# Patient Record
Sex: Male | Born: 1960
Health system: Southern US, Community
[De-identification: ages and names within clinical notes are randomized; demographics above are authoritative.]

## PROBLEM LIST (undated history)

## (undated) DIAGNOSIS — Z8673 Personal history of transient ischemic attack (TIA), and cerebral infarction without residual deficits: Secondary | ICD-10-CM

## (undated) DIAGNOSIS — G4730 Sleep apnea, unspecified: Secondary | ICD-10-CM

## (undated) DIAGNOSIS — T7840XA Allergy, unspecified, initial encounter: Secondary | ICD-10-CM

## (undated) DIAGNOSIS — F419 Anxiety disorder, unspecified: Secondary | ICD-10-CM

## (undated) DIAGNOSIS — Z8489 Family history of other specified conditions: Secondary | ICD-10-CM

## (undated) DIAGNOSIS — F32A Depression, unspecified: Secondary | ICD-10-CM

## (undated) DIAGNOSIS — D123 Benign neoplasm of transverse colon: Secondary | ICD-10-CM

## (undated) DIAGNOSIS — D122 Benign neoplasm of ascending colon: Secondary | ICD-10-CM

## (undated) DIAGNOSIS — Z87898 Personal history of other specified conditions: Secondary | ICD-10-CM

## (undated) DIAGNOSIS — H9190 Unspecified hearing loss, unspecified ear: Secondary | ICD-10-CM

## (undated) DIAGNOSIS — I639 Cerebral infarction, unspecified: Secondary | ICD-10-CM

## (undated) DIAGNOSIS — K219 Gastro-esophageal reflux disease without esophagitis: Secondary | ICD-10-CM

## (undated) DIAGNOSIS — Z972 Presence of dental prosthetic device (complete) (partial): Secondary | ICD-10-CM

## (undated) DIAGNOSIS — D124 Benign neoplasm of descending colon: Secondary | ICD-10-CM

## (undated) DIAGNOSIS — F329 Major depressive disorder, single episode, unspecified: Secondary | ICD-10-CM

## (undated) HISTORY — DX: Allergy, unspecified, initial encounter: T78.40XA

## (undated) HISTORY — PX: EYE SURGERY: SHX253

## (undated) HISTORY — DX: Benign neoplasm of transverse colon: D12.3

## (undated) HISTORY — DX: Cerebral infarction, unspecified: I63.9

## (undated) HISTORY — PX: TONSILLECTOMY: SUR1361

## (undated) HISTORY — PX: SALIVARY GLAND SURGERY: SHX768

## (undated) HISTORY — DX: Benign neoplasm of descending colon: D12.4

## (undated) HISTORY — DX: Benign neoplasm of ascending colon: D12.2

---

## 2005-01-16 ENCOUNTER — Emergency Department: Payer: Self-pay | Admitting: Emergency Medicine

## 2006-01-16 ENCOUNTER — Emergency Department: Payer: Self-pay

## 2006-01-16 ENCOUNTER — Other Ambulatory Visit: Payer: Self-pay

## 2006-02-06 ENCOUNTER — Other Ambulatory Visit: Payer: Self-pay

## 2006-02-06 ENCOUNTER — Emergency Department: Payer: Self-pay | Admitting: Emergency Medicine

## 2008-03-24 ENCOUNTER — Emergency Department: Payer: Self-pay | Admitting: Emergency Medicine

## 2008-03-24 ENCOUNTER — Other Ambulatory Visit: Payer: Self-pay

## 2010-06-27 DIAGNOSIS — Z8673 Personal history of transient ischemic attack (TIA), and cerebral infarction without residual deficits: Secondary | ICD-10-CM

## 2010-06-27 HISTORY — DX: Personal history of transient ischemic attack (TIA), and cerebral infarction without residual deficits: Z86.73

## 2010-07-01 ENCOUNTER — Inpatient Hospital Stay: Payer: Self-pay | Admitting: Internal Medicine

## 2011-01-25 ENCOUNTER — Emergency Department: Payer: Self-pay | Admitting: Unknown Physician Specialty

## 2011-03-03 ENCOUNTER — Ambulatory Visit (HOSPITAL_COMMUNITY)
Admission: RE | Admit: 2011-03-03 | Discharge: 2011-03-03 | Disposition: A | Discharge status: Home / Self Care | Payer: No Typology Code available for payment source | Source: Ambulatory Visit | Attending: Orthopaedic Surgery | Admitting: Orthopaedic Surgery

## 2011-03-03 DIAGNOSIS — M6281 Muscle weakness (generalized): Secondary | ICD-10-CM | POA: Insufficient documentation

## 2011-03-03 DIAGNOSIS — IMO0002 Reserved for concepts with insufficient information to code with codable children: Secondary | ICD-10-CM | POA: Insufficient documentation

## 2011-03-03 DIAGNOSIS — M25619 Stiffness of unspecified shoulder, not elsewhere classified: Secondary | ICD-10-CM | POA: Insufficient documentation

## 2011-03-03 DIAGNOSIS — Z5189 Encounter for other specified aftercare: Secondary | ICD-10-CM | POA: Insufficient documentation

## 2011-03-03 DIAGNOSIS — M25519 Pain in unspecified shoulder: Secondary | ICD-10-CM | POA: Insufficient documentation

## 2011-03-03 DIAGNOSIS — R262 Difficulty in walking, not elsewhere classified: Secondary | ICD-10-CM | POA: Insufficient documentation

## 2011-03-03 DIAGNOSIS — M25569 Pain in unspecified knee: Secondary | ICD-10-CM | POA: Insufficient documentation

## 2011-03-03 DIAGNOSIS — M25669 Stiffness of unspecified knee, not elsewhere classified: Secondary | ICD-10-CM | POA: Insufficient documentation
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
## 2011-03-08 ENCOUNTER — Ambulatory Visit (HOSPITAL_COMMUNITY)
Admission: RE | Admit: 2011-03-08 | Discharge: 2011-03-08 | Disposition: A | Discharge status: Home / Self Care | Payer: No Typology Code available for payment source | Source: Ambulatory Visit | Attending: Orthopaedic Surgery | Admitting: Orthopaedic Surgery

## 2011-03-08 DIAGNOSIS — M6281 Muscle weakness (generalized): Secondary | ICD-10-CM

## 2011-03-08 DIAGNOSIS — IMO0002 Reserved for concepts with insufficient information to code with codable children: Secondary | ICD-10-CM

## 2011-03-08 DIAGNOSIS — M25519 Pain in unspecified shoulder: Secondary | ICD-10-CM

## 2011-03-08 NOTE — Progress Notes (Signed)
Occupational Therapy Treatment  Patient Name: Jeffrey Stokes MRN: [id number] Today's Date: 03/08/2011 Manual Therapy [phone]  Therapeutic Exercise [id number] Visit 2/12  Reassessment date: 03/31/11  Diagnosis: Left Shoulder Strain  Referring Physician: Dr. Yisroel Ramming  S:  My hands both go numb. Pain Assessment Currently in Pain?: Yes Pain Score:   5 Pain Location: Shoulder Pain Orientation: Left Pain Type: Acute pain   O:  Exercise/Treatments Shoulder Exercises Shoulder Elevation: Seated;AROM;10 reps Shoulder Flexion: Supine;PROM;AAROM;10 reps Therapy Ball (Shoulder Flexion): 20 Pulleys (Shoulder Flexion): Flex and Abd 2 min each Shoulder Extension: Seated;AROM;10 reps Shoulder Retraction: Seated;AROM;10 reps Shoulder Protraction: Supine;PROM;AAROM;10 reps Shoulder Horizontal ABduction: Supine;PROM;AAROM;10 reps Shoulder ABduction: Supine;PROM;AAROM;10 reps Therapy Ball (Shoulder Abduction): 20 abduction  Shoulder External Rotation: Supine;PROM;AAROM;10 reps Shoulder Internal Rotation: Supine;PROM;AAROM;10 reps Row: Seated;AROM;10 reps Additional ROM/Strengthening Exercises Thumb Tacks: 1 min Elevation/Depression - Shoulder Pro/Retraction: 1 min Manual Therapy Manual Therapy: Myofascial release Myofascial Release: MFR and manual stretching to left upper arm, pectoral, and scapular region to decrease pain and fascial restriction and increase pain-free range of motion in his left shoulder region.   Goals Short Term Goals Short Term Goal 1: Patient will be I with HEP. Short Term Goal 1 Progress: Progressing toward goal Short Term Goal 2: Patient will increase PROM in left shoulder to [facility] for increased ability to sleep in a variety of positions at night. Short Term Goal 2 Progress: Progressing toward goal Short Term Goal 3: Patient will increase left shoulder strength to 4-/5 for increased ability lifting tools at work. Short Term Goal 3 Progress: Progressing toward goal Short  Term Goal 4: Patient will decrease pain to 4/10 in his left shoulder. Short Term Goal 4 Progress: Progressing toward goal Short Term Goal 5: Patient will decrease fascial restrictions to min-mod from mod. Long Term Goals Long Term Goal 1: Patient will return to prior level of I with all B/IADLs, work, and leisure activities. Long Term Goal 1 Progress: Progressing toward goal Long Term Goal 2: Patient will increase AROM in his left shoulder to [facility] for increased ability to reach overhead. Long Term Goal 2 Progress: Progressing toward goal Long Term Goal 3: Patient will increase left shoulder strength to 5/5 for increased ability to perform job tasks. Long Term Goal 3 Progress: Progressing toward goal Long Term Goal 4: Patient will decrease pain level to 1/10 in his left shoulder. Long Term Goal 4 Progress: Progressing toward goal Long Term Goal 5: Patient will decrease fascial restrictions to trace in his left shoulder region. Long Term Goal 5 Progress: Progressing toward goal End of Session Patient Active Problem List  Diagnoses  . Knee pain  . Knee stiffness  . Pain in joint, shoulder region  . Sprain and strain of unspecified site of shoulder and upper arm  . Muscle weakness (generalized)   End of Session Activity Tolerance: Patient tolerated treatment well General Behavior During Session: [facility] for tasks performed Cognition: [facility] for tasks performed OT Assessment and Plan Clinical Impression Statement: A:  patient is very wary of moving shoulder into abduction greater than 90. OT Plan: P:  Increase PROM to [facility].  Attempt ball circles.   Shirlean Mylar, OTR/L  03/08/2011, 9:31 AM

## 2011-03-08 NOTE — Progress Notes (Signed)
Physical Therapy Treatment Patient Details  Name: Jeffrey Stokes MRN: [id number] Date of Birth: 07/22/1960  Today's Date: 03/08/2011 Time: 4098-1191 Time Calculation (min): 42 min Visit#: 2 of 12 Re-eval: 04/01/11 Charges:  therex 30, ultasound 8'  Subjective: Symptoms/Limitations Symptoms: Pt. states walking on oneven ground most painful and has alot of it at his house.  Currently 4/10 medial L knee. Pain Assessment Currently in Pain?: Yes Pain Score:   4 Pain Location: Knee Pain Orientation: Left Pain Type: Acute pain   Exercise/Treatments Warmup: Bike 6' @ 3.0 seat 12 Standing: Heelraises 10X Toeraises 10X Squats 10X SLS L only max of 8 sec, 3 trials Supine:  SLR x10  Heel Slides x10  Active HS stretch 2x30 sec.  Sidelying:  Hip Abduction x10  Hip Adduction x10 Prone:   SLR Hamstring curls  Progress to steps and lat/fwd/bkwd walking with bungee cord   Modalities Modalities: Ultrasound Manual Therapy Manual Therapy: Myofascial release Myofascial Release: MFR and manual stretching to left upper arm, pectoral, and scapular region to decrease pain and fascial restriction and increase pain-free range of motion in his left shoulder region. Ultrasound Ultrasound Location: L medial knee Ultrasound Parameters: 1.4 w/cm2 for 8 minutes Ultrasound Goals: Pain  Physical Therapy Assessment and Plan PT Assessment and Plan Clinical Impression Statement: Added standing exercises, including SLS with max time of 8 seconds.  Pt. able to complete therex with VC's to perform slow and controlled. PT Treatment/Interventions: Therapeutic exercise (ultrasound) PT Plan: progress strength and stablitily.  Add ITB stretch, LAQ and forward lunges next visit.      Problem List Patient Active Problem List  Diagnoses  . Knee pain  . Knee stiffness  . Pain in joint, shoulder region  . Sprain and strain of unspecified site of shoulder and upper arm  . Muscle weakness (generalized)     PT - End of Session Activity Tolerance: Patient tolerated treatment well General Behavior During Session: [facility] for tasks performed Cognition: [facility] for tasks performed  Bascom Levels, Ella Guillotte B 03/08/2011, 10:30 AM

## 2011-03-09 ENCOUNTER — Ambulatory Visit (HOSPITAL_COMMUNITY)
Admission: RE | Admit: 2011-03-09 | Discharge: 2011-03-09 | Payer: No Typology Code available for payment source | Source: Ambulatory Visit | Attending: Occupational Therapy | Admitting: Occupational Therapy

## 2011-03-09 DIAGNOSIS — M25519 Pain in unspecified shoulder: Secondary | ICD-10-CM

## 2011-03-09 DIAGNOSIS — IMO0002 Reserved for concepts with insufficient information to code with codable children: Secondary | ICD-10-CM

## 2011-03-09 DIAGNOSIS — M6281 Muscle weakness (generalized): Secondary | ICD-10-CM

## 2011-03-09 NOTE — Progress Notes (Signed)
Physical Therapy Treatment Patient Details  Name: Jeffrey Stokes MRN: [id number] Date of Birth: [birth year]  Today's Date: 03/09/2011 Time: 1022-1110 Time Calculation (min): 48 min Visit#: 3 of 12 Re-eval: 04/01/11 Charge: therex 34 min [place] 9 min  Subjective: Symptoms/Limitations Symptoms: PT: 3/10 L knee medial epicondyle region, feeling a whole lot better than when I came in Pain Assessment Currently in Pain?: Yes Pain Score:   3 Pain Location: Knee Pain Orientation: Left Pain Type: Acute pain  Precautions/Restrictions     Mobility (including Balance)       Exercise/Treatments Warmup:  Bike 6' @ 4.5 seat 12  Standing:  Heelraises 10X 2 sets Toeraises 10X  Squats 10X  SLS L only max of 13 sec, 3 trials  Forward lunge 10 reps Forward step up 10x 4" Lateral step up 10x 4" Step down 10x 4" Supine:  SLR x10  Heel Slides x10  Active HS stretch 3x30 sec.  ITB st 3x 30" Sidelying:  Hip Abduction x10  Hip Adduction x10  Seated:  LAQ 10x  Prone:  SLR 10x Hamstring curls 10x [place] 3 MhZ @ 1.4 w/cm2 continuous to medial L knee Modalities Modalities: Ultrasound Manual Therapy Myofascial Release: MFR and manual stretching to left upper arm, pectoral, and scapular region to decrease pain and fascial restrictions and increase pain-free range of motion in his left shoulder region. Ultrasound Ultrasound Location: L medial knee Ultrasound Parameters: 3 MHz @ 1.4 w/cm2 continuous for 8 min Ultrasound Goals: Pain  Physical Therapy Assessment and Plan PT Assessment and Plan Clinical Impression Statement: Added forward lunges and 4" step in all directions with vc/demonstration for proper tech.  Pt required vc for posture/form with lunges.  Pt tolerated treatment well with vc's to perform slow and controlled.  Improving SLS. PT Plan: Progress to steps and lat/fwd/bkwd walking with bungee cord    Goals    Problem List Patient Active Problem List  Diagnoses  . Knee pain    . Knee stiffness  . Pain in joint, shoulder region  . Sprain and strain of unspecified site of shoulder and upper arm  . Muscle weakness (generalized)    PT - End of Session Activity Tolerance: Patient tolerated treatment well General Behavior During Session: [facility] for tasks performed Cognition: [facility] for tasks performed  Juel Burrow 03/09/2011, 12:10 PM

## 2011-03-09 NOTE — Progress Notes (Signed)
Occupational Therapy Treatment  Patient Name: Jeffrey Stokes MRN: [id number] Today's Date: 03/09/2011  Time in 940  Time out 1024 Manual Therapy  [phone] Therapeutic Exercise 1000-1024 Visit  3/12  Reassessment date: 03/31/11  Diagnosis: Left Shoulder Strain  Referring Physician: Dr. Yisroel Ramming      HPI: Symptoms/Limitations Symptoms: PT: 3/10 L knee medial epicondyle region, feeling a whole lot better than when I came in Pain Assessment Currently in Pain?: Yes Pain Score:   3 Pain Location: Knee Pain Orientation: Left Pain Type: Acute pain    Exercise/Treatments Shoulder Exercises Shoulder Elevation: Seated;AROM;10 reps Shoulder Flexion: Supine;PROM;AAROM;15 reps Therapy Ball (Shoulder Flexion): 20 Pulleys (Shoulder Flexion): Flex and Abd 2 min each Shoulder Extension: Seated;AROM;10 reps Shoulder Retraction: Seated;AROM;10 reps Shoulder Protraction: Supine;PROM;AAROM;15 reps Shoulder Horizontal ABduction: Supine;PROM;AAROM;15 reps Shoulder ABduction: Supine;PROM;AAROM;15 reps Therapy Ball (Shoulder Abduction): 20 abduction  Shoulder External Rotation: Supine;PROM;AAROM;15 reps Shoulder Internal Rotation: Supine;PROM;AAROM;15 reps Row: Seated;AROM;10 reps Additional ROM/Strengthening Exercises Thumb Tacks: 1 min Elevation/Depression - Shoulder Pro/Retraction: 1 min Manual Therapy Myofascial Release: MFR and manual stretching to left upper arm, pectoral, and scapular region to decrease pain and fascial restrictions and increase pain-free range of motion in his left shoulder region.   Goals Short Term Goals Short Term Goal 1: Patient will be I with HEP. Short Term Goal 1 Progress: Progressing toward goal Short Term Goal 2: Patient will increase PROM in left shoulder to [facility] for increased ability to sleep in a variety of positions at night. Short Term Goal 2 Progress: Progressing toward goal Short Term Goal 3: Patient will increase left shoulder strength to 4-/5 for  increased ability lifting tools at work. Short Term Goal 3 Progress: Progressing toward goal Short Term Goal 4: Patient will decrease pain to 4/10 in his left shoulder. Short Term Goal 4 Progress: Progressing toward goal Short Term Goal 5: Patient will decrease fascial restrictions to min-mod from mod. Short Term Goal 5 Progress: Progressing toward goal Long Term Goals Long Term Goal 1: Patient will return to prior level of I with all B/IADLs, work, and leisure activities. Long Term Goal 1 Progress: Progressing toward goal Long Term Goal 2: Patient will increase AROM in his left shoulder to [facility] for increased ability to reach overhead. Long Term Goal 2 Progress: Progressing toward goal Long Term Goal 3: Patient will increase left shoulder strength to 5/5 for increased ability to perform job tasks. Long Term Goal 3 Progress: Progressing toward goal Long Term Goal 4: Patient will decrease pain level to 1/10 in his left shoulder. Long Term Goal 4 Progress: Progressing toward goal Long Term Goal 5: Patient will decrease fascial restrictions to trace in his left shoulder region. Long Term Goal 5 Progress: Progressing toward goal End of Session Patient Active Problem List  Diagnoses  . Knee pain  . Knee stiffness  . Pain in joint, shoulder region  . Sprain and strain of unspecified site of shoulder and upper arm  . Muscle weakness (generalized)   End of Session Activity Tolerance: Patient tolerated treatment well OT Assessment and Plan Clinical Impression Statement: Full PROM today, some pain with initial abduction movement but subsides with reps  Unable to do ball circles secondary to time. Prognosis: Good OT Plan: attempt ball circles.   Noralee Stain, Cleopha Indelicato L 03/09/2011, 11:08 AM

## 2011-03-11 ENCOUNTER — Ambulatory Visit (HOSPITAL_COMMUNITY)
Admission: RE | Admit: 2011-03-11 | Discharge: 2011-03-11 | Disposition: A | Discharge status: Home / Self Care | Payer: No Typology Code available for payment source | Source: Ambulatory Visit | Attending: Specialist | Admitting: Specialist

## 2011-03-11 DIAGNOSIS — IMO0002 Reserved for concepts with insufficient information to code with codable children: Secondary | ICD-10-CM

## 2011-03-11 DIAGNOSIS — M6281 Muscle weakness (generalized): Secondary | ICD-10-CM

## 2011-03-11 DIAGNOSIS — M25519 Pain in unspecified shoulder: Secondary | ICD-10-CM

## 2011-03-11 NOTE — Progress Notes (Signed)
Physical Therapy Treatment Patient Details  Name: Jeffrey Stokes MRN: [id number] Date of Birth: 08/14/1960  Today's Date: 03/11/2011 Time: 5638-7564 Time Calculation (min): 40 min Charges: 8' [place], 32' TE Visit#: 4 of 12 Re-eval: 04/01/11    Subjective: Symptoms/Limitations Symptoms: PT S: Pt reports he is compliant with his HEP and some exercises hurt sometimes when I have been doing too much.  The ultrasound helped a lot last time. Pain Assessment Currently in Pain?: Yes Pain Score:   2 Pain Location: Knee Pain Orientation: Left  Precautions/Restrictions     Mobility (including Balance)       Exercise/Treatments Stretches   Aerobic Stationary Bike: 6' @ 3.5 seat 12 Machines for Strengthening Cybex Knee Extension: BLE x10 2 PL Cybex Knee Flexion: BLE x10 3 PL Plyometrics   Standing Heel Raises: 10 reps;Limitations;2 sets Heel Raises Limitations: LLE only for first set and eccentric lowering for second set Lateral Step Up: Left;2 sets;10 reps;Step Height: 4" Forward Step Up: Left;2 sets;10 reps;Step Height: 4" Step Down: Left;2 sets;Step Height: 4" Wall Squat: 5 reps;10 seconds Lunge Walking - Round Trips: 1 RT SLS: 3x30 sec w/intermittent HHA Other Standing Knee Exercises: Toe raises 2x 10 Seated   Supine Short Arc Quad Sets: Left;1 set;10 reps;Limitations Short Arc Quad Sets Limitations: 3 lbs Sidelying   Prone      Modalities Modalities: Ultrasound Manual Therapy Ultrasound Ultrasound Location: Completed by PT: L Medial Knee  Ultrasound Parameters: 3 MHz @ 1.4 w/cm2 continuous for 8 min  Ultrasound Goals: Pain  Physical Therapy Assessment and Plan PT Assessment and Plan Clinical Impression Statement: Pt able to tolerate all new ther-ex well without increased pain.  Continues to have difficulty with eccentric quad control.  PT Plan: Cont to progress and add sports cord    Goals    Problem List Patient Active Problem List  Diagnoses  . Knee  pain  . Knee stiffness  . Pain in joint, shoulder region  . Sprain and strain of unspecified site of shoulder and upper arm  . Muscle weakness (generalized)    PT - End of Session Activity Tolerance: Patient tolerated treatment well  Jeffrey Stokes 03/11/2011, 9:48 AM

## 2011-03-11 NOTE — Progress Notes (Signed)
Occupational Therapy Treatment  Patient Details  Name: Jeffrey Stokes MRN: [id number] Date of Birth: 1961-05-18  Today's Date: 03/11/2011 Time: 1191-4782 Time Calculation (min): 40 min Manual Therapy [phone] Therapeutic Exercise 826-848  Visit#: 4 of 12 Re-eval:  03/31/11 Diagnosis: Left Shoulder Strain  Referring Physician: Dr. Yisroel Ramming  Subjective S: I can tell it is helping, but it feels like it is going to go out of joint. Pain Assessment Currently in Pain?: Yes Pain Score:   4 Pain Location: shoulder Pain Orientation: Left   Exercise/Treatments Manual Therapy Manual Therapy: Myofascial release Myofascial Release: MFR and manual stretching to left upper arm, pectoral, and scapular region to decrease pain and fascial restrictions and increase pain free range of motion in his left shoulder region. Therapeutic Exercises:  Please see doc flow sheet, exercises would not pull over.  Goals Short Term Goals Short Term Goal 1: Patient will be I with HEP. Short Term Goal 2: Patient will increase PROM in left shoulder to [facility] for increased ability to sleep in a variety of positions at night. Short Term Goal 3: Patient will increase left shoulder strength to 4-/5 for increased ability lifting tools at work. Short Term Goal 4: Patient will decrease pain to 4/10 in his left shoulder. Short Term Goal 5: Patient will decrease fascial restrictions to min-mod from mod. Long Term Goals Long Term Goal 1: Patient will return to prior level of I with all B/IADLs, work, and leisure activities. Long Term Goal 2: Patient will increase AROM in his left shoulder to [facility] for increased ability to reach overhead. Long Term Goal 3: Patient will increase left shoulder strength to 5/5 for increased ability to perform job tasks. Long Term Goal 4: Patient will decrease pain level to 1/10 in his left shoulder. Long Term Goal 5: Patient will decrease fascial restrictions to trace in his left shoulder  region. End of Session Patient Active Problem List  Diagnoses  . Knee pain  . Knee stiffness  . Pain in joint, shoulder region  . Sprain and strain of unspecified site of shoulder and upper arm  . Muscle weakness (generalized)   End of Session Activity Tolerance: Patient tolerated treatment well General Behavior During Session: [facility] for tasks performed Cognition: [facility] for tasks performed OT Assessment and Plan Clinical Impression Statement: A:  full PROM this date in supine.  Pain level remains elevated despite ease of range of motion. Added rhythmic stabilization in supine.  Hold ball circles. OT Plan: P:  hold ball circles due to shoulder integrity.  attempt AROM in seated.   Shirlean Mylar, OTR/L  03/11/2011, 10:45 AM

## 2011-03-14 ENCOUNTER — Ambulatory Visit (HOSPITAL_COMMUNITY)
Admission: RE | Admit: 2011-03-14 | Discharge: 2011-03-14 | Payer: No Typology Code available for payment source | Source: Ambulatory Visit | Attending: Orthopaedic Surgery | Admitting: Orthopaedic Surgery

## 2011-03-14 DIAGNOSIS — M25519 Pain in unspecified shoulder: Secondary | ICD-10-CM

## 2011-03-14 DIAGNOSIS — IMO0002 Reserved for concepts with insufficient information to code with codable children: Secondary | ICD-10-CM

## 2011-03-14 DIAGNOSIS — M6281 Muscle weakness (generalized): Secondary | ICD-10-CM

## 2011-03-14 NOTE — Progress Notes (Signed)
Occupational Therapy Treatment  Patient Details  Name: Jeffrey Stokes MRN: [id number] Date of Birth: 07/23/1960  Today's Date: 03/14/2011 Time: 4098-1191 Time Calculation (min): 35 min Manual Therapy [phone]  Therapeutic Exercise [phone] Visit#: 5 of 12  Re-eval: 03/31/11  Diagnosis: Left Shoulder Strain  Referring Physician: Dr. Yisroel Ramming     Subjective Symptoms/Limitations Symptoms: Feel good.  Shoulder is mild, I feel a lot better. Pain Assessment Currently in Pain?: Yes Pain Score:   2 Pain Location: Shoulder Pain Orientation: Left     Exercise/Treatments  03/14/11 1006  Shoulder Exercises: Supine  Protraction PROM;10 reps;AAROM;15 reps  Horizontal ABduction PROM;10 reps;AAROM;15 reps  External Rotation PROM;10 reps;AAROM;15 reps  Internal Rotation PROM;10 reps;AAROM;15 reps  Flexion PROM;10 reps;AAROM;15 reps  ABduction PROM;10 reps;AAROM;15 reps  Other Supine Exercises AROM in supine all of above exercises except abd x 10 reps  Shoulder Exercises: Seated  Extension 15 reps;AROM  Retraction 15 reps;AAROM  Row 15 reps;AROM  Protraction AROM;10 reps  Horizontal ABduction AROM;10 reps  External Rotation AROM;10 reps (with elbow at side 0 degrees abduction)  Internal Rotation AROM;10 reps (at 0 degrees abduction)  Flexion AROM;10 reps  Shoulder Exercises: Therapy Ball  Flexion 20 reps  ABduction 20 reps  Shoulder Exercises: ROM/Strengthening  Rhythmic Stabilization, Supine 30" at 90 degrees    Additional ROM/Strengthening Exercises Thumb Tacks: 1 min Elevation/Depression - Shoulder Pro/Retraction: 1 min Manual Therapy Manual Therapy: Myofascial release Myofascial Release: MFR and manual stretching to left upper arm, pectoral, and scapular region to decrease pain and fascial restrictions and increase pain-free range of motion in his left shoulder region.  Goals Short Term Goals Short Term Goal 1: Patient will be I with HEP. Short Term Goal 1 Progress:  Progressing toward goal Short Term Goal 2: Patient will increase PROM in left shoulder to [facility] for increased ability to sleep in a variety of positions at night. Short Term Goal 2 Progress: Progressing toward goal Short Term Goal 3: Patient will increase left shoulder strength to 4-/5 for increased ability lifting tools at work. Short Term Goal 3 Progress: Progressing toward goal Short Term Goal 4: Patient will decrease pain to 4/10 in his left shoulder. Short Term Goal 4 Progress: Progressing toward goal Short Term Goal 5: Patient will decrease fascial restrictions to min-mod from mod. Short Term Goal 5 Progress: Progressing toward goal Long Term Goals Long Term Goal 1: Patient will return to prior level of I with all B/IADLs, work, and leisure activities. Long Term Goal 1 Progress: Progressing toward goal Long Term Goal 2: Patient will increase AROM in his left shoulder to [facility] for increased ability to reach overhead. Long Term Goal 2 Progress: Progressing toward goal Long Term Goal 3: Patient will increase left shoulder strength to 5/5 for increased ability to perform job tasks. Long Term Goal 3 Progress: Progressing toward goal Long Term Goal 4: Patient will decrease pain level to 1/10 in his left shoulder. Long Term Goal 4 Progress: Progressing toward goal Long Term Goal 5: Patient will decrease fascial restrictions to trace in his left shoulder region. Long Term Goal 5 Progress: Progressing toward goal End of Session Patient Active Problem List  Diagnoses  . Knee pain  . Knee stiffness  . Pain in joint, shoulder region  . Sprain and strain of unspecified site of shoulder and upper arm  . Muscle weakness (generalized)   End of Session Activity Tolerance: Patient tolerated treatment well General Behavior During Session: [facility] for tasks performed Cognition: [facility] for tasks  performed OT Assessment and Plan Clinical Impression Statement: added seated AROM which patient tolerated well.   Patient with consistent pain with abduction in supine and seated stating he feels like it is going to "pop out"  Some decrease in pain with eccentric contraction. Prognosis: Good OT Plan: increase reps with seated and supine.   Kerisha Goughnour L. Chanise Habeck, COTA/L  03/14/2011, 10:25 AM

## 2011-03-14 NOTE — Progress Notes (Signed)
Physical Therapy Treatment Patient Details  Name: Jeffrey Stokes MRN: [id number] Date of Birth: 18-Nov-1960  Today's Date: 03/14/2011 Time: 9147-8295 Time Calculation (min): 53 min Visit#: 5 of 12 Re-eval: 04/01/11  Charges: 8' [place], 36' TE   Subjective: Symptoms/Limitations Symptoms: Pt. states he still has a "catch" medial knee, reports 2/10 pain L medial knee. Pain Assessment Currently in Pain?: Yes Pain Score:   2 Pain Location: Knee Pain Orientation: Left   Exercise/Treatments  Stationary Bike: 6'@8 .0 seat 12 Machines for Strengthening Cybex Knee Extension: 2PL 2X10 Cybex Knee Flexion: 3PL 2X10 Standing: Heel Raises: 2 sets;10 reps Heel Raises Limitations: LLE only for first set and eccentric lowering for second set Lateral Step Up: Left;2 sets;10 reps;Step Height: 4" Forward Step Up: Left;2 sets;10 reps;Step Height: 4" Step Down: Left;2 sets;Step Height: 4" Wall Squat: 10 reps;10 seconds Lunge Walking - Round Trips: 2 RT SLS: 1 minute Other Standing Knee Exercises: Toe raises 2x 10 Supine Short Arc Quad Sets: Left;10 reps;Limitations;2 sets Short Arc Quad Sets Limitations: 3#     Modalities Modalities: Ultrasound Ultrasound Ultrasound Location: L medial knee Ultrasound Parameters: 1.4w/cm2 continuous for 8 minutes at 3 MHz Ultrasound Goals: Pain  Physical Therapy Assessment and Plan PT Assessment and Plan Clinical Impression Statement: Pt. having more pain in R knee today; able to complete all therex with VC's for performing slowly to focus on eccentrics.  Able to perform SLS for 1 minute today. PT Treatment/Interventions: Therapeutic exercise (Ultrasound) PT Plan: Progress strength and stablility.  Add sports cord and begin rebounder on L LE next visit.    Goals PT Short Term Goals Time to Complete Goals: 4 weeks PT Short Term Goal 1: 1.Pt will be Independent in HEP in order to maximize therapeutic effect. PT Short Term Goal 1 - Progress: Progressing  toward goal PT Short Term Goal 2: 2.Pt will report pain less than 4/10 during 50%  of his day PT Short Term Goal 2 - Progress: Progressing toward goal PT Short Term Goal 3: 3.Pt will demonstrate LLE SLS x10 sec.  PT Short Term Goal 3 - Progress: Met PT Short Term Goal 4: 4.Pt will improve AROM to 0-110 PT Short Term Goal 4 - Progress: Met PT Short Term Goal 5: 5. Pt will go from sit to stand w/o UE support in 12 sec for improved LE power. PT Short Term Goal 5 - Progress: Progressing toward goal PT Long Term Goals PT Long Term Goal 1: 1.Pt will improve knee AROM to [facility] in order to ambulate with appropriate gait mechanics without knee brace. PT Long Term Goal 1 - Progress: Progressing toward goal PT Long Term Goal 2: 2.Pt will increase LE strength to [facility] in order to ambulate and stand for greater than an hour to participate in community activities PT Long Term Goal 2 - Progress: Progressing toward goal Long Term Goal 3: 3.Pt will report pain less than or equal to 3/10 for 75% of his day for improved quality of life. Long Term Goal 3 Progress: Progressing toward goal Long Term Goal 4: 4.Pt will improve her LEFS score by 9 points for improved perceived functional ability.  Long Term Goal 4 Progress: Progressing toward goal  Problem List Patient Active Problem List  Diagnoses  . Knee pain  . Knee stiffness  . Pain in joint, shoulder region  . Sprain and strain of unspecified site of shoulder and upper arm  . Muscle weakness (generalized)    PT - End of Session Activity Tolerance: Patient  tolerated treatment well General Behavior During Session: [facility] for tasks performed Cognition: [facility] for tasks performed  Emeline Gins B 03/14/2011, 9:42 AM

## 2011-03-16 ENCOUNTER — Ambulatory Visit (HOSPITAL_COMMUNITY)
Admission: RE | Admit: 2011-03-16 | Discharge: 2011-03-16 | Payer: No Typology Code available for payment source | Source: Ambulatory Visit | Attending: Orthopaedic Surgery | Admitting: Orthopaedic Surgery

## 2011-03-16 DIAGNOSIS — IMO0002 Reserved for concepts with insufficient information to code with codable children: Secondary | ICD-10-CM

## 2011-03-16 DIAGNOSIS — M6281 Muscle weakness (generalized): Secondary | ICD-10-CM

## 2011-03-16 DIAGNOSIS — M25519 Pain in unspecified shoulder: Secondary | ICD-10-CM

## 2011-03-16 NOTE — Progress Notes (Signed)
Physical Therapy Treatment Patient Details  Name: Jeffrey Stokes MRN: [id number] Date of Birth: 09/21/1960  Today's Date: 03/16/2011 Time: 8295-6213 Time Calculation (min): 51 min Visit#: 6  of 12   Re-eval: 04/01/11 Charges:  therex 38', ultrasound 8'    Subjective: Symptoms/Limitations Symptoms: Pt. reports currently not bad, but pain typically increases with therex.  2/10 pain L knee. Pain Assessment Currently in Pain?: Yes Pain Score:   2 Pain Location: Knee Pain Orientation: Left Pain Type: Acute pain   Exercise/Treatments  Aerobic Stationary Bike: 6'@8 .0 seat 12 Machines for Strengthening Cybex Knee Extension: 2.5 PL 2X10 Cybex Knee Flexion: 3.5 PL 2X10 Standing Heel Raises: 15 reps Heel Raises Limitations: LLE only Lateral Step Up: Left;15 reps;Step Height: 4" Forward Step Up: Left;15 reps;Step Height: 4" Step Down: Left;15 reps;Step Height: 4" Wall Squat: 10 reps;10 seconds Lunge Walking - Round Trips: 2 RT Rebounder: red, L LE only 15X Other Standing Knee Exercises: bungee walk R/L, F/B 5X each Supine Straight Leg Raises: 2 sets;10 reps   Modalities Modalities: Ultrasound Manual Therapy Manual Therapy: Myofascial release Myofascial Release: MFR and manual stretching to left upper arm, pectoral, and scapular region to decrease pain and fascial restrictions and increase pain-free range of motion in his left shoulder region. Ultrasound Ultrasound Location: L medial knee Ultrasound Parameters: 1.4 w/cm2 continuous for 8 minutes at 3 MHz  Physical Therapy Assessment and Plan PT Assessment and Plan Clinical Impression Statement: Pt. able to complete new therex,progressed to rebounder with 2 touchdowns X 15 reps; Added bungee cord walk due to assist with pt's goal of amb up/down hills at home. PT Plan: Progress per POC. Increase reps as able.     Problem List Patient Active Problem List  Diagnoses  . Knee pain  . Knee stiffness  . Pain in joint,  shoulder region  . Sprain and strain of unspecified site of shoulder and upper arm  . Muscle weakness (generalized)    PT - End of Session Activity Tolerance: Patient tolerated treatment well General Behavior During Session: [facility] for tasks performed Cognition: [facility] for tasks performed  Jeffrey Stokes B 03/16/2011, 10:24 AM

## 2011-03-16 NOTE — Progress Notes (Signed)
Occupational Therapy Treatment  Patient Details  Name: Jeffrey Stokes MRN: [id number] Date of Birth: [birth year]  Today's Date: 03/16/2011 Time: 2952-8413 Time Calculation (min): 42 min Visit#: 6  of 12   Re-eval: 04/13/11  Manual Therapy [id number]  Therapeutic Exercise [id number]  Diagnosis: Left Shoulder Strain  Referring Physician: Dr. Yisroel Ramming   Subjective  Symptoms/Limitations  Symptoms: Patient reports being less cautious with his movements Pain Assessment  Currently in Pain?: Yes  Pain Score: 3 Pain Location: Shoulder  Pain Orientation: Left    Exercise/Treatments Supine Protraction: PROM;10 reps Horizontal ABduction: PROM;10 reps External Rotation: PROM;10 reps Internal Rotation: PROM;10 reps Flexion: PROM;10 reps ABduction: PROM;10 reps Seated Protraction: AROM;12 reps Horizontal ABduction: AROM;12 reps External Rotation: AROM;12 reps Internal Rotation: AROM;12 reps Flexion: AROM;12 reps Abduction: AROM;12 reps Sidelying External Rotation: AROM;10 reps Internal Rotation: AROM;10 reps Flexion: AROM;10 reps ABduction: AROM;10 reps Other Sidelying Exercises: hzn abd AROM x10 ROM / Strengthening / Isometric Strengthening Proximal Shoulder Strengthening, Supine: x 20 each Rhythmic Stabilization, Supine: 1' at 90 degrees    Manual Therapy Manual Therapy: Myofascial release Myofascial Release: MFR and manual stretching to left upper arm, pectoral, and scapular region to decrease pain and fascial restrictions and increase pain-free range of motion in his left shoulder region.  Occupational Therapy Assessment and Plan OT Assessment and Plan Clinical Impression Statement: added proximal shoulder strengthening in supine which fatigued patient but did not increase pain.  Unable to complete AROM supine secondary to pain and feeling of instability.  Swiitched to sidlying which patient did not have any pain with.  Added UBE which patient tolerated well. Rehab Potential:  Good OT Plan: increase reps and attempt supine AROM, see progress note.   Goals Short Term Goals Short Term Goal 1: Patient will be I with HEP. Short Term Goal 1 Progress: Progressing toward goal Short Term Goal 2: Patient will increase PROM in left shoulder to [facility] for increased ability to sleep in a variety of positions at night. Short Term Goal 2 Progress: Progressing toward goal Short Term Goal 3: Patient will increase left shoulder strength to 4-/5 for increased ability lifting tools at work. Short Term Goal 3 Progress: Progressing toward goal Short Term Goal 4: Patient will decrease pain to 4/10 in his left shoulder. Short Term Goal 4 Progress: Progressing toward goal Short Term Goal 5: Patient will decrease fascial restrictions to min-mod from mod. Short Term Goal 5 Progress: Progressing toward goal Long Term Goals Long Term Goal 1: Patient will return to prior level of I with all B/IADLs, work, and leisure activities. Long Term Goal 1 Progress: Progressing toward goal Long Term Goal 2: Patient will increase AROM in his left shoulder to [facility] for increased ability to reach overhead. Long Term Goal 2 Progress: Progressing toward goal Long Term Goal 3: Patient will increase left shoulder strength to 5/5 for increased ability to perform job tasks. Long Term Goal 3 Progress: Progressing toward goal Long Term Goal 4: Patient will decrease pain level to 1/10 in his left shoulder. Long Term Goal 4 Progress: Progressing toward goal Long Term Goal 5: Patient will decrease fascial restrictions to trace in his left shoulder region. Long Term Goal 5 Progress: Progressing toward goal End of Session Patient Active Problem List  Diagnoses  . Knee pain  . Knee stiffness  . Pain in joint, shoulder region  . Sprain and strain of unspecified site of shoulder and upper arm  . Muscle weakness (generalized)   End of Session Activity Tolerance:  Patient tolerated treatment well General Behavior  During Session: [facility] for tasks performed Cognition: [facility] for tasks performed   Noralee Stain, Lexa Coronado L 03/16/2011, 1:45 PM

## 2011-03-18 ENCOUNTER — Ambulatory Visit (HOSPITAL_COMMUNITY)
Admission: RE | Admit: 2011-03-18 | Discharge: 2011-03-18 | Disposition: A | Discharge status: Home / Self Care | Payer: No Typology Code available for payment source | Source: Ambulatory Visit | Attending: Orthopaedic Surgery | Admitting: Orthopaedic Surgery

## 2011-03-18 DIAGNOSIS — M25519 Pain in unspecified shoulder: Secondary | ICD-10-CM

## 2011-03-18 DIAGNOSIS — M6281 Muscle weakness (generalized): Secondary | ICD-10-CM

## 2011-03-18 DIAGNOSIS — IMO0002 Reserved for concepts with insufficient information to code with codable children: Secondary | ICD-10-CM

## 2011-03-18 NOTE — Progress Notes (Signed)
Physical Therapy Treatment Patient Details  Name: Jeffrey Stokes MRN: [id number] Date of Birth: 03-29-1961  Today's Date: 03/18/2011 Time: 9147-8295 Time Calculation (min): 43 min Charges: 35' TE, 8' [place] Visit#: 7  of 12   Re-eval: 04/01/11    Subjective: Symptoms/Limitations Symptoms: Been driving tp [place] and back today, got a cortizone shot in shoulder, L knee pain 1 and a half/10. Pain Assessment Currently in Pain?: Yes Pain Score:   1 Pain Location: Knee Pain Orientation: Left  Exercise/Treatments  Modalities Modalities: Ultrasound Ultrasound Ultrasound Location: L medial knee  Ultrasound Parameters: 1.4 w/cm2 continuous for 8 minutes at 3 MHz  Ultrasound Goals: Pain  03/18/11 0700  Knee Exercises: Machines for Strengthening  Cybex Knee Extension 3 PL 2x 10  Cybex Knee Flexion 5 PL 1x 10  Knee Exercises: Standing  Lateral Step Up Left;1 set;Step Height: 6"  Forward Step Up Left;10 reps;Step Height: 6"  Step Down Left;10 reps;Step Height: 6"  Wall Squat 10 reps;10 seconds  Lunge Walking - Round Trips 3 RT  Stairs 3 RT ascend/ descend with intermittent handrail  SLS with Vectors 5 sec hold each direction x5  Other Standing Knee Exercises bungee walk R/L, F/B 7X each      Physical Therapy Assessment and Plan PT Assessment and Plan Clinical Impression Statement: Pt continues to tolerate new higher level exercises well without an increase in pain.   PT Plan: Continue to progress functional strength and ROM.     Goals    Problem List Patient Active Problem List  Diagnoses  . Knee pain  . Knee stiffness  . Pain in joint, shoulder region  . Sprain and strain of unspecified site of shoulder and upper arm  . Muscle weakness (generalized)       Jeffrey Stokes 03/18/2011, 2:32 PM

## 2011-03-18 NOTE — Progress Notes (Signed)
Occupational Therapy Treatment  Patient Details  Name: Jeffrey Stokes MRN: [id number] Date of Birth: 03-05-1961  Today's Date: 03/18/2011 Time: 6962-9528 Time Calculation (min): 45 min Visit#: 7  of 12   Re-eval: 04/13/11   Manual Therapy [phone] (21')  Therapeutic Exercise [phone] (23') Diagnosis: Left Shoulder Strain  Referring Physician: Dr. Yisroel Ramming  Subjective Symptoms/Limitations Symptoms: That cortisone shot made my pain go up to a 4/10 Pain Assessment Currently in Pain?: Yes Pain Score:   4 Pain Location: Shoulder Pain Orientation: Left Pain Type: Acute pain   Exercise/Treatments Supine Horizontal ABduction: PROM;10 reps External Rotation: PROM;10 reps Internal Rotation: PROM;10 reps Flexion: PROM;10 reps ABduction: PROM;10 reps Seated Extension: Theraband (standing) Theraband Level (Shoulder Extension): Level 2 (Red) Retraction: Theraband (standing) Theraband Level (Shoulder Retraction): Level 2 (Red) Row: Theraband (standing) Theraband Level (Shoulder Row): Level 2 (Red) Protraction: AROM;15 reps Horizontal ABduction: AROM;15 reps External Rotation: AROM;15 reps;Theraband;10 reps (standing) Theraband Level (Shoulder External Rotation): Level 2 (Red) Internal Rotation: AROM;15 reps;Theraband;10 reps (standing) Theraband Level (Shoulder Internal Rotation): Level 2 (Red) Flexion: AROM;15 reps Abduction: AROM;15 reps Sidelying External Rotation: AROM;15 reps Internal Rotation: AROM;15 reps Flexion: AROM;15 reps ABduction: AROM;15 reps Other Sidelying Exercises: hzn abd AROM x15 Other Sidelying Exercises: protraction AROM x 15 Therapy Ball Flexion: 20 reps ABduction: 20 reps ROM / Strengthening / Isometric Strengthening UBE (Upper Arm Bike): 3' forward and 3' reverse 1.0 Thumb Tacks: 1 min Proximal Shoulder Strengthening, Supine: x 20 each Rhythmic Stabilization, Supine: 1' at 90 degrees   Modalities Modalities: Ultrasound Manual Therapy Manual  Therapy: Myofascial release Myofascial Release: MFR and manual stretching to left upper arm, pectoral, and scapular region to decrease pain and fascial restrictions and increase pain-free range of motion in his left shoulder region.  Occupational Therapy Assessment and Plan OT Assessment and Plan Clinical Impression Statement: added wall wash which fatigued patient but no increase in pain also added tband scapula stab ex.  patient tolerated well, no increase paini. Rehab Potential: Good OT Plan: add one pound to sidlying exercises   Goals Short Term Goals Short Term Goal 1: Patient will be I with HEP. Short Term Goal 2: Patient will increase PROM in left shoulder to [facility] for increased ability to sleep in a variety of positions at night. Short Term Goal 3: Patient will increase left shoulder strength to 4-/5 for increased ability lifting tools at work. Short Term Goal 4: Patient will decrease pain to 4/10 in his left shoulder. Short Term Goal 5: Patient will decrease fascial restrictions to min-mod from mod. Long Term Goals Long Term Goal 1: Patient will return to prior level of I with all B/IADLs, work, and leisure activities. Long Term Goal 2: Patient will increase AROM in his left shoulder to [facility] for increased ability to reach overhead. Long Term Goal 3: Patient will increase left shoulder strength to 5/5 for increased ability to perform job tasks. Long Term Goal 4: Patient will decrease pain level to 1/10 in his left shoulder. Long Term Goal 5: Patient will decrease fascial restrictions to trace in his left shoulder region. End of Session Patient Active Problem List  Diagnoses  . Knee pain  . Knee stiffness  . Pain in joint, shoulder region  . Sprain and strain of unspecified site of shoulder and upper arm  . Muscle weakness (generalized)   End of Session Activity Tolerance: Patient tolerated treatment well   Virginio Isidore L. Johann Gascoigne, COTA/L  03/18/2011, 3:57 PM

## 2011-03-21 ENCOUNTER — Ambulatory Visit (HOSPITAL_COMMUNITY)
Admission: RE | Admit: 2011-03-21 | Discharge: 2011-03-21 | Payer: No Typology Code available for payment source | Source: Ambulatory Visit | Attending: Physical Therapy | Admitting: Physical Therapy

## 2011-03-21 DIAGNOSIS — IMO0002 Reserved for concepts with insufficient information to code with codable children: Secondary | ICD-10-CM

## 2011-03-21 DIAGNOSIS — M6281 Muscle weakness (generalized): Secondary | ICD-10-CM

## 2011-03-21 DIAGNOSIS — M25519 Pain in unspecified shoulder: Secondary | ICD-10-CM

## 2011-03-21 NOTE — Progress Notes (Signed)
Physical Therapy Treatment Patient Details  Name: Jeffrey Stokes MRN: [id number] Date of Birth: 06/13/1961  Today's Date: 03/21/2011 Time: 4098-1191 Time Calculation (min): 35 min Visit#: 8  of 12   Re-eval: 03/31/11 Charges:  therex 24, ultrasound 8'    Subjective: Symptoms/Limitations Symptoms: Pt. states he's feeling great today.  0.2 pain in L shoulder since cortisone shot, 0.5 pain in L knee today. Pain Assessment Currently in Pain?: Yes Pain Score:   1 (0.5) Pain Location: Knee Pain Orientation: Left  Machines for Strengthening  Cybex Knee Extension: 3 PL 2X10  Cybex Knee Flexion: 5 PL 2X10  Standing  Heel Raises Limitations: LLE only 15X  Lateral Step Up: 15 reps;Step Height: 6"  Forward Step Up: Left;15 reps;Step Height: 6"  Step Down: Left;15 reps;Step Height: 6"  Wall Squat: 10 reps;10 seconds  Lunge Walking - Round Trips: 3 RT  Rebounder: red, L LE only 15X (time today) Other Standing Knee Exercises: bungee walk R/L, F/B 5X each (time today) [place] 3RT (time today) Supine  Ultrasound  Modalities Modalities: Ultrasound Ultrasound Ultrasound Location: L medial knee Ultrasound Parameters: 1.4 w/cm2 continuous for 8 minutes with 3 MHz Ultrasound Goals: Pain  Physical Therapy Assessment and Plan PT Assessment and Plan Clinical Impression Statement: Pt. with increasing strength and decreasing pain. PT Plan: Continue to progress per POC; Pt. returns to MD 04/01/11.    Problem List Patient Active Problem List  Diagnoses  . Knee pain  . Knee stiffness  . Pain in joint, shoulder region  . Sprain and strain of unspecified site of shoulder and upper arm  . Muscle weakness (generalized)    PT - End of Session Activity Tolerance: Patient tolerated treatment well General Behavior During Session: [facility] for tasks performed Cognition: [facility] for tasks performed  Bascom Levels, Dionne Knoop B 03/21/2011, 10:20 AM

## 2011-03-21 NOTE — Progress Notes (Signed)
Occupational Therapy Treatment  Patient Details  Name: Jeffrey Stokes MRN: [id number] Date of Birth: Aug 06, 1960  Today's Date: 03/21/2011 Time: 4696-2952 Time Calculation (min): 39 min Visit#: 8  of 12   Re-eval: 04/13/11 Manual Therapy 1036-1050 Therapeutic Exercise  8413-2440 Diagnosis: Left Shoulder Strain  Referring Physician: Dr. Yisroel Ramming    Subjective Symptoms/Limitations Symptoms: 0.2 in my shoulder, that cortisone shot really helped.  i even slept on it last night. Pain Assessment Currently in Pain?: Yes Pain Score:  (0.2) Pain Location: Knee Pain Orientation: Left Exercise/Treatments Supine Protraction: Strengthening;10 reps;Weights Protraction Weight (lbs): 1# Horizontal ABduction: Strengthening;10 reps;Weights Horizontal ABduction Weight (lbs): 1# External Rotation: Strengthening;10 reps;Weights External Rotation Weight (lbs): 1# Internal Rotation: Strengthening;10 reps;Weights Internal Rotation Weight (lbs): 1# Flexion: Strengthening;10 reps;Weights Shoulder Flexion Weight (lbs): 1# ABduction: Strengthening;10 reps;Weights Shoulder ABduction Weight (lbs): 1# Seated Extension: Theraband Theraband Level (Shoulder Extension): Level 3 (Green) Retraction: Theraband Theraband Level (Shoulder Retraction): Level 3 (Green) Row: Theraband Theraband Level (Shoulder Row): Level 3 (Green) Protraction: Strengthening;10 reps;Weights Protraction Weight (lbs): 1# Horizontal ABduction: Strengthening;10 reps;Weights Horizontal ABduction Weight (lbs): 1# External Rotation: Strengthening;10 reps;Weights External Rotation Weight (lbs): 1# Internal Rotation: Strengthening;10 reps;Weights Internal Rotation Weight (lbs): 1# Flexion: Strengthening;10 reps;Weights Flexion Weight (lbs): 1# Abduction: Strengthening;10 reps;Weights ABduction Weight (lbs): 1#  Sidelying External Rotation:  (switched to supine)   Flexion:  (d/c) ABduction:  (d/c) ROM / Strengthening /  Isometric Strengthening UBE (Upper Arm Bike): 3' forward and 3' reverse 1.5 Wall Wash: 2' Thumb Tacks: 1 min     Occupational Therapy Assessment and Plan OT Assessment and Plan Clinical Impression Statement: Patient stated he was able to hang shelves overhead at home over the weekend, added AROM supine with 1# and 1# to seated, no increase pain with any task and little to no fatigue. Rehab Potential: Good OT Plan: increse reps   Goals Short Term Goals Short Term Goal 1: Patient will be I with HEP. Short Term Goal 2: Patient will increase PROM in left shoulder to [facility] for increased ability to sleep in a variety of positions at night. Short Term Goal 3: Patient will increase left shoulder strength to 4-/5 for increased ability lifting tools at work. Short Term Goal 4: Patient will decrease pain to 4/10 in his left shoulder. Short Term Goal 5: Patient will decrease fascial restrictions to min-mod from mod. Long Term Goals Long Term Goal 1: Patient will return to prior level of I with all B/IADLs, work, and leisure activities. Long Term Goal 2: Patient will increase AROM in his left shoulder to [facility] for increased ability to reach overhead. Long Term Goal 3: Patient will increase left shoulder strength to 5/5 for increased ability to perform job tasks. Long Term Goal 4: Patient will decrease pain level to 1/10 in his left shoulder. Long Term Goal 5: Patient will decrease fascial restrictions to trace in his left shoulder region. End of Session Patient Active Problem List  Diagnoses  . Knee pain  . Knee stiffness  . Pain in joint, shoulder region  . Sprain and strain of unspecified site of shoulder and upper arm  . Muscle weakness (generalized)   End of Session Activity Tolerance: Patient tolerated treatment well   Noel Rodier L. Noralee Stain, COTA/L  03/21/2011, 2:34 PM

## 2011-03-23 ENCOUNTER — Ambulatory Visit (HOSPITAL_COMMUNITY)
Admission: RE | Admit: 2011-03-23 | Discharge: 2011-03-23 | Payer: No Typology Code available for payment source | Source: Ambulatory Visit | Attending: Orthopaedic Surgery | Admitting: Orthopaedic Surgery

## 2011-03-23 DIAGNOSIS — IMO0002 Reserved for concepts with insufficient information to code with codable children: Secondary | ICD-10-CM

## 2011-03-23 DIAGNOSIS — M6281 Muscle weakness (generalized): Secondary | ICD-10-CM

## 2011-03-23 DIAGNOSIS — M25519 Pain in unspecified shoulder: Secondary | ICD-10-CM

## 2011-03-23 NOTE — Progress Notes (Signed)
Occupational Therapy Treatment  Patient Details  Name: Jeffrey Stokes MRN: [id number] Date of Birth: 1961/01/02  Today's Date: 03/23/2011 Time: 4098-1191 Time Calculation (min): 43 min Visit#: 9  of 12   Re-eval: 04/13/11 (Going to MD on 04/01/11) Therapeutic Exercise [phone] 42'  Diagnosis: Left Shoulder Strain  Referring Physician: Dr. Yisroel Ramming    Subjective Symptoms/Limitations Symptoms: I feel like I could take on the world with this arm. Pain Assessment Currently in Pain?: No/denies Pain Score: 0-No pain  Exercise/Treatments Supine Protraction: Strengthening;15 reps Protraction Weight (lbs): 1# Horizontal ABduction: Strengthening;15 reps Horizontal ABduction Weight (lbs): 1# External Rotation: Strengthening;15 reps External Rotation Weight (lbs): 1# Internal Rotation: Strengthening;15 reps Internal Rotation Weight (lbs): 1# Flexion: Strengthening;15 reps Shoulder Flexion Weight (lbs): 1# ABduction: Strengthening;15 reps Shoulder ABduction Weight (lbs): 1# Seated Extension: Theraband;15 reps Theraband Level (Shoulder Extension): Level 3 (Green) Retraction: Theraband;15 reps Theraband Level (Shoulder Retraction): Level 3 (Green) Row: Theraband;15 reps Theraband Level (Shoulder Row): Level 3 (Green) Protraction: Strengthening;15 reps Protraction Weight (lbs): 1# Horizontal ABduction: Strengthening;15 reps Horizontal ABduction Weight (lbs): 1# External Rotation: Strengthening;15 reps;Theraband Theraband Level (Shoulder External Rotation): Level 3 (Green) External Rotation Weight (lbs): 1# Internal Rotation: Strengthening;15 reps;Theraband Theraband Level (Shoulder Internal Rotation): Level 3 (Green) Internal Rotation Weight (lbs): 1# Flexion: Strengthening;15 reps Flexion Weight (lbs): 1# Abduction: Strengthening;15 reps ABduction Weight (lbs): 1# Sidelying External Rotation: Strengthening;15 reps;Weights External Rotation Weight (lbs): 1# Internal Rotation:  Strengthening;15 reps;Weights Internal Rotation Weight (lbs): 1# Flexion: Strengthening;15 reps;Weights Flexion Weight (lbs): 1# ABduction: Strengthening;15 reps;Weights ABduction Weight (lbs): 1# Other Sidelying Exercises: hzn abd strenghtening with 1# ROM / Strengthening / Isometric Strengthening UBE (Upper Arm Bike): 3' forward and 3' reverse 2.0 Cybex Press: 1 plate;10 reps Cybex Row: 1 plate;10 reps Wall Wash: 3' Thumb Tacks: 2'   Occupational Therapy Assessment and Plan OT Assessment and Plan Clinical Impression Statement: added 1# in sidlying and cybex press and row.  Added tband to hep for scap stab exercises. OT Plan: Increase reps with cybex and attempt to increase weight if patient still has no pain. D/C tband to HEP   Goals Short Term Goals Short Term Goal 1: Patient will be I with HEP. Short Term Goal 2: Patient will increase PROM in left shoulder to [facility] for increased ability to sleep in a variety of positions at night. Short Term Goal 3: Patient will increase left shoulder strength to 4-/5 for increased ability lifting tools at work. Short Term Goal 4: Patient will decrease pain to 4/10 in his left shoulder. Short Term Goal 5: Patient will decrease fascial restrictions to min-mod from mod. Long Term Goals Long Term Goal 1: Patient will return to prior level of I with all B/IADLs, work, and leisure activities. Long Term Goal 2: Patient will increase AROM in his left shoulder to [facility] for increased ability to reach overhead. Long Term Goal 3: Patient will increase left shoulder strength to 5/5 for increased ability to perform job tasks. Long Term Goal 4: Patient will decrease pain level to 1/10 in his left shoulder. Long Term Goal 5: Patient will decrease fascial restrictions to trace in his left shoulder region. End of Session Patient Active Problem List  Diagnoses  . Knee pain  . Knee stiffness  . Pain in joint, shoulder region  . Sprain and strain of unspecified  site of shoulder and upper arm  . Muscle weakness (generalized)   End of Session Activity Tolerance: Patient tolerated treatment well General Behavior During Session: [facility] for tasks performed  Cognition: [facility] for tasks performed   Kadijah Shamoon L. Kaisen Ackers, COTA/L  03/23/2011, 11:44 AM

## 2011-03-23 NOTE — Progress Notes (Signed)
Physical Therapy Treatment Patient Details  Name: Jeffrey Stokes MRN: [id number] Date of Birth: 24-Jul-1960  Today's Date: 03/23/2011 Time: 8295-6213 Time Calculation (min): 27 min Visit#: 9  of 12   Re-eval: 03/31/11  Charge: therex 21 min  Subjective: Symptoms/Limitations Symptoms: PT 1/2 out of 10 L knee pain today, feeling a lot better. Pain Assessment Currently in Pain?: Yes Pain Score:   1 Pain Location: Knee Pain Orientation: Left   Exercise/Treatments Aerobic Stationary Bike: 6'@8 .0 seat 12 Machines for Strengthening Cybex Knee Extension: 3.5 PL 2x 15 Cybex Knee Flexion: 5.5 PL 2x 15  Standing Heel Raises: 20 reps;Limitations Heel Raises Limitations: LLE only Lateral Step Up: Limitations Lateral Step Up Limitations: 3RT on stairwell Forward Step Up: Limitations Forward Step Up Limitations: 3RT on stairwell Step Down: Limitations Step Down Limitations: 3 RT on stairwell Functional Squat: 2 sets;10 reps Wall Squat: 15 reps;10 seconds Lunge Walking - Round Trips: 3 RT Stairs: 3 RT ascend/ descend with intermittent handrail Other Standing Knee Exercises: bungee walk R/L, F/B 10X each  Physical Therapy Assessment and Plan PT Assessment and Plan Clinical Impression Statement: Pt tolerated well towards treatment.  Strength improving with no increased pain during total session.  Able to increase weight with cybex quad and hamstring machine without diff.  Held [place] today, secondary to low pain level and no increase pain.   PT Plan: Assess pain without the [place], continue to progress per POC.  Pt returns to MD 04/01/2011..    Goals    Problem List Patient Active Problem List  Diagnoses  . Knee pain  . Knee stiffness  . Pain in joint, shoulder region  . Sprain and strain of unspecified site of shoulder and upper arm  . Muscle weakness (generalized)    PT - End of Session Activity Tolerance: Patient tolerated treatment well General Behavior During Session: [facility] for  tasks performed Cognition: [facility] for tasks performed  Juel Burrow 03/23/2011, 10:59 AM

## 2011-03-25 ENCOUNTER — Ambulatory Visit (HOSPITAL_COMMUNITY): Payer: No Typology Code available for payment source | Admitting: Physical Therapy

## 2011-03-25 ENCOUNTER — Telehealth (HOSPITAL_COMMUNITY): Payer: Self-pay | Admitting: Physical Therapy

## 2011-03-29 ENCOUNTER — Ambulatory Visit (HOSPITAL_COMMUNITY): Payer: No Typology Code available for payment source | Admitting: Physical Therapy

## 2011-03-29 ENCOUNTER — Telehealth (HOSPITAL_COMMUNITY): Payer: Self-pay

## 2011-03-30 ENCOUNTER — Ambulatory Visit (HOSPITAL_COMMUNITY)
Admission: RE | Admit: 2011-03-30 | Discharge: 2011-03-30 | Disposition: A | Discharge status: Home / Self Care | Payer: No Typology Code available for payment source | Source: Ambulatory Visit | Attending: Orthopaedic Surgery | Admitting: Orthopaedic Surgery

## 2011-03-30 DIAGNOSIS — M25519 Pain in unspecified shoulder: Secondary | ICD-10-CM | POA: Insufficient documentation

## 2011-03-30 DIAGNOSIS — R262 Difficulty in walking, not elsewhere classified: Secondary | ICD-10-CM | POA: Insufficient documentation

## 2011-03-30 DIAGNOSIS — M25619 Stiffness of unspecified shoulder, not elsewhere classified: Secondary | ICD-10-CM | POA: Insufficient documentation

## 2011-03-30 DIAGNOSIS — M25569 Pain in unspecified knee: Secondary | ICD-10-CM | POA: Insufficient documentation

## 2011-03-30 DIAGNOSIS — IMO0002 Reserved for concepts with insufficient information to code with codable children: Secondary | ICD-10-CM

## 2011-03-30 DIAGNOSIS — M25669 Stiffness of unspecified knee, not elsewhere classified: Secondary | ICD-10-CM | POA: Insufficient documentation

## 2011-03-30 DIAGNOSIS — Z5189 Encounter for other specified aftercare: Secondary | ICD-10-CM | POA: Insufficient documentation

## 2011-03-30 DIAGNOSIS — M6281 Muscle weakness (generalized): Secondary | ICD-10-CM | POA: Insufficient documentation
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
## 2011-03-30 NOTE — Progress Notes (Signed)
Occupational Therapy Treatment  Patient Details  Name: Jeffrey Stokes MRN: [id number] Date of Birth: Jan 29, 1961  Today's Date: 03/30/2011 Time: 7829-5621 Time Calculation (min): 34 min Visit#: 10  of 12   Re-eval: 04/13/11 [facility] Therapy  106-117  11' Therapeutic Exercise  117-140  23' Diagnosis: Left Shoulder Strain  Referring Physician: Dr. Yisroel Ramming   Subjective Symptoms/Limitations Symptoms: I feel good even though I fell the other day. Pain Assessment Currently in Pain?: No/denies Pain Score: 0-No pain Exercise/Treatments Supine Protraction: Strengthening;12 reps Protraction Weight (lbs): 2# Horizontal ABduction: Strengthening;12 reps Horizontal ABduction Weight (lbs): 2# External Rotation: Strengthening;12 reps External Rotation Weight (lbs): 2# Internal Rotation: Strengthening;12 reps Internal Rotation Weight (lbs): 2# Flexion: Strengthening;12 reps Shoulder Flexion Weight (lbs): 2# ABduction: Strengthening;12 reps Shoulder ABduction Weight (lbs): 2# Seated Extension:  (d/c to hep) Retraction:  (d/c to hep) Row:  (d/c to hep) Protraction: Strengthening;12 reps Protraction Weight (lbs): 2# Horizontal ABduction: Strengthening;12 reps Horizontal ABduction Weight (lbs): 2# External Rotation: Strengthening;12 reps Theraband Level (Shoulder External Rotation):  (d/c to hep) External Rotation Weight (lbs): 2# Internal Rotation: Strengthening;12 reps Theraband Level (Shoulder Internal Rotation):  (d/c to ehp) Internal Rotation Weight (lbs): 2# Flexion: Strengthening;12 reps Flexion Weight (lbs): 2# Abduction: Strengthening;12 reps ABduction Weight (lbs): 2# Sidelying External Rotation:  (switched to supine) ROM / Strengthening / Isometric Strengthening UBE (Upper Arm Bike): 3' forward and 3' reverse 3.0 Cybex Press: 2 plate;15 reps Cybex Row: 2 plate;15 reps Wall Wash: 3' Thumb Tacks: 2' Proximal Shoulder Strengthening, Seated: x20 with 2# Rhythmic  Stabilization, Seated: 1' at 90   Manual Therapy Manual Therapy: Myofascial release Myofascial Release: MFR and manual stretching to left upper arm, pectoral, and scapular region to decrease pain and fascial restrictions and increase pain-free range of motion in his left shoulder region.  Occupational Therapy Assessment and Plan OT Assessment and Plan Clinical Impression Statement: A:  Added 2# to supine and seated ex and prox. shoulder strength.  None of these produced pain or discomfort.  Pt going to MD on friday. Rehab Potential: Good OT Plan: Possible d/c per MD recommendation.   Goals Short Term Goals Short Term Goal 1: Patient will be I with HEP. Short Term Goal 2: Patient will increase PROM in left shoulder to [facility] for increased ability to sleep in a variety of positions at night. Short Term Goal 3: Patient will increase left shoulder strength to 4-/5 for increased ability lifting tools at work. Short Term Goal 4: Patient will decrease pain to 4/10 in his left shoulder. Short Term Goal 5: Patient will decrease fascial restrictions to min-mod from mod. Long Term Goals Long Term Goal 1: Patient will return to prior level of I with all B/IADLs, work, and leisure activities. Long Term Goal 2: Patient will increase AROM in his left shoulder to [facility] for increased ability to reach overhead. Long Term Goal 3: Patient will increase left shoulder strength to 5/5 for increased ability to perform job tasks. Long Term Goal 4: Patient will decrease pain level to 1/10 in his left shoulder. Long Term Goal 5: Patient will decrease fascial restrictions to trace in his left shoulder region. End of Session Patient Active Problem List  Diagnoses  . Knee pain  . Knee stiffness  . Pain in joint, shoulder region  . Sprain and strain of unspecified site of shoulder and upper arm  . Muscle weakness (generalized)   End of Session Activity Tolerance: Patient tolerated treatment well   Kinzee Happel L.  Noralee Stain, COTA/L  03/30/2011, 1:39 PM

## 2011-04-01 ENCOUNTER — Ambulatory Visit (HOSPITAL_COMMUNITY): Payer: No Typology Code available for payment source | Admitting: Physical Therapy

## 2013-10-30 ENCOUNTER — Ambulatory Visit: Payer: Self-pay | Admitting: Internal Medicine

## 2014-03-23 ENCOUNTER — Emergency Department: Payer: Self-pay | Admitting: Emergency Medicine

## 2014-03-24 LAB — CBC
HCT: 42.4 % (ref 40.0–52.0)
HGB: 14.5 g/dL (ref 13.0–18.0)
MCH: 33.4 pg (ref 26.0–34.0)
MCHC: 34.1 g/dL (ref 32.0–36.0)
MCV: 98 fL (ref 80–100)
Platelet: 202 10*3/uL (ref 150–440)
RBC: 4.33 10*6/uL — AB (ref 4.40–5.90)
RDW: 13.4 % (ref 11.5–14.5)
WBC: 16.1 10*3/uL — ABNORMAL HIGH (ref 3.8–10.6)

## 2014-03-24 LAB — BASIC METABOLIC PANEL
Anion Gap: 7 (ref 7–16)
BUN: 13 mg/dL (ref 7–18)
CO2: 26 mmol/L (ref 21–32)
Calcium, Total: 7.9 mg/dL — ABNORMAL LOW (ref 8.5–10.1)
Chloride: 110 mmol/L — ABNORMAL HIGH (ref 98–107)
Creatinine: 1.03 mg/dL (ref 0.60–1.30)
EGFR (African American): >60
EGFR (Non-African Amer.): >60
Glucose: 105 mg/dL — ABNORMAL HIGH (ref 65–99)
OSMOLALITY: 285 (ref 275–301)
Potassium: 4 mmol/L (ref 3.5–5.1)
Sodium: 143 mmol/L (ref 136–145)

## 2014-03-24 LAB — TROPONIN I: Troponin-I: <0.02 ng/mL

## 2014-03-27 HISTORY — PX: SHOULDER ARTHROSCOPY: SHX128

## 2014-04-07 ENCOUNTER — Other Ambulatory Visit (HOSPITAL_COMMUNITY): Payer: Self-pay | Admitting: Cardiology

## 2014-04-07 DIAGNOSIS — R0989 Other specified symptoms and signs involving the circulatory and respiratory systems: Secondary | ICD-10-CM

## 2014-04-08 ENCOUNTER — Ambulatory Visit (HOSPITAL_COMMUNITY): Payer: Worker's Compensation | Attending: Orthopedic Surgery | Admitting: *Deleted

## 2014-04-08 DIAGNOSIS — I6523 Occlusion and stenosis of bilateral carotid arteries: Secondary | ICD-10-CM | POA: Diagnosis not present

## 2014-04-08 DIAGNOSIS — I639 Cerebral infarction, unspecified: Secondary | ICD-10-CM | POA: Diagnosis not present

## 2014-04-08 DIAGNOSIS — R0989 Other specified symptoms and signs involving the circulatory and respiratory systems: Secondary | ICD-10-CM

## 2014-04-08 NOTE — Progress Notes (Signed)
Carotid Duplex Performed 

## 2014-04-15 ENCOUNTER — Encounter: Payer: Self-pay | Admitting: Cardiovascular Disease

## 2014-04-15 ENCOUNTER — Ambulatory Visit (INDEPENDENT_AMBULATORY_CARE_PROVIDER_SITE_OTHER): Payer: Worker's Compensation | Admitting: Cardiovascular Disease

## 2014-04-15 VITALS — BP 113/79 | HR 50 | Ht 72.0 in | Wt 205.5 lb

## 2014-04-15 DIAGNOSIS — Z7189 Other specified counseling: Secondary | ICD-10-CM

## 2014-04-15 DIAGNOSIS — I779 Disorder of arteries and arterioles, unspecified: Secondary | ICD-10-CM | POA: Diagnosis not present

## 2014-04-15 DIAGNOSIS — I639 Cerebral infarction, unspecified: Secondary | ICD-10-CM

## 2014-04-15 DIAGNOSIS — Z01818 Encounter for other preprocedural examination: Secondary | ICD-10-CM

## 2014-04-15 DIAGNOSIS — I6523 Occlusion and stenosis of bilateral carotid arteries: Secondary | ICD-10-CM | POA: Insufficient documentation

## 2014-04-15 DIAGNOSIS — I739 Peripheral vascular disease, unspecified: Secondary | ICD-10-CM

## 2014-04-15 MED ORDER — TRAMADOL HCL 50 MG PO TABS: 50.0000 mg | ORAL_TABLET | Freq: Three times a day (TID) | ORAL | Status: DC | PRN

## 2014-04-15 MED ORDER — CLOPIDOGREL BISULFATE 75 MG PO TABS: 75.0000 mg | ORAL_TABLET | Freq: Every day | ORAL | Status: DC

## 2014-04-15 NOTE — Patient Instructions (Addendum)
Your physician has requested that you have an echocardiogram. Echocardiography is a painless test that uses sound waves to create images of your heart. It provides your doctor with information about the size and shape of your heart and how well your heart's chambers and valves are working. This procedure takes approximately one hour. There are no restrictions for this procedure. Office will contact with results via phone or letter.    Begin Plavix 75mg  daily - after shoulder surgery - new sent to [facility] when begin the Plavix  Follow up pending test results above

## 2014-04-15 NOTE — Progress Notes (Signed)
Patient ID: Jeffrey Stokes, male   DOB: 03-Aug-1960, 53 y.o.   MRN: [id number]       CARDIOLOGY CONSULT NOTE  Patient ID: Jeffrey Stokes MRN: [id number] DOB/AGE: [birth year] 53 y.o.  Admit date: (Not on file) Primary Physician PROVIDER NOT [place]  Reason for Consultation: preop clearance  HPI: The patient is a 53 year old male with a prior history of tobacco use and three prior strokes who has been referred for preoperative risk stratification prior to undergoing left shoulder scope and rotator cuff repair. An ECG performed in the office today demonstrates sinus bradycardia, heart rate 50 beats per minute, with no ischemic ST segment or T-wave abnormalities. Recent carotid artery Dopplers demonstrated mild bilateral carotid artery disease with 1-39% stenosis. He takes a baby aspirin approximately three times per week. He denies symptoms of chest pain, shortness of breath, palpitations, lightheadedness, orthopnea, PND, leg swelling and syncope. He suffered his first stroke in 2007 and quit smoking at that time. He said he recently had his cholesterol checked and results were "normal". I do not have a copy of these results. He said his father and mother are healthy and his father is 66 and his mother is 61. 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 His strokes have affected the right side of his body and he feels weaker on this side. He has significant left shoulder and arm discomfort due to a torn rotator cuff.  Soc: Works at [employer]. Quit smoking in 2007. Fam: No h/o premature CAD.    Allergies  Allergen Reactions  . Hydrocodone Anxiety    Anxiety attacks  . Norco [Hydrocodone-Acetaminophen] Nausea Only    Current Outpatient Prescriptions  Medication Sig Dispense Refill  . UNKNOWN TO PATIENT Takes medication for pain as needed prescribed by Dr. Noemi Chapel. Pt does not know the name of it.       No current  facility-administered medications for this visit.    No past medical history on file.  No past surgical history on file.  History   Social History  . Marital Status: Single    Spouse Name: N/A    Number of Children: N/A  . Years of Education: N/A   Occupational History  . Not on file.   Social History Main Topics  . Smoking status: Former Smoker -- 2.00 packs/day for 33 years    Types: Cigarettes    Start date: 06/28/1971    Quit date: 12/25/2005  . Smokeless tobacco: Former Systems developer    Types: Chew  . Alcohol Use: No     Comment: Quit a long time ago  . Drug Use: No  . Sexual Activity: Not on file   Other Topics Concern  . Not on file   Social History Narrative  . No narrative on file     No family history of premature CAD in 1st degree relatives.  Prior to Admission medications   Medication Sig Start Date End Date Taking? Authorizing Provider  UNKNOWN TO PATIENT Takes medication for pain as needed prescribed by Dr. Noemi Chapel. Pt does not know the name of it.   Yes Historical Provider, MD     Review of systems complete and found to be negative unless listed above in HPI     Physical exam Blood pressure 113/79, pulse 50, height 6' (1.829 m), weight 205 lb 8 oz (93.214 kg), SpO2 99.00%. General: NAD Neck: No JVD, no thyromegaly  or thyroid nodule.  Lungs: Clear to auscultation bilaterally with normal respiratory effort. CV: Nondisplaced PMI. Bradycardic, regular rhythm, normal S1/S2, no S3/S4, no murmur.  No peripheral edema.  No carotid bruit.  Abdomen: Soft, nontender, no hepatosplenomegaly, no distention.  Skin: Intact without lesions or rashes.  Neurologic: Alert and oriented x 3. 4/5 strength in right upper extremity. Psych: Normal affect. Extremities: No clubbing or cyanosis.  HEENT: Normal.   ECG: Most recent ECG reviewed.  Labs:   No results found for this basename: WBC, HGB, HCT, MCV, PLT   No results found for this basename: NA, K, CL, CO2, BUN,  CREATININE, CALCIUM, LABALBU, PROT, BILITOT, ALKPHOS, ALT, AST, GLUCOSE,  in the last 168 hours No results found for this basename: CKTOTAL, CKMB, CKMBINDEX, TROPONINI    No results found for this basename: CHOL   No results found for this basename: HDL   No results found for this basename: LDLCALC   No results found for this basename: TRIG   No results found for this basename: CHOLHDL   No results found for this basename: LDLDIRECT         Studies: No results found.  ASSESSMENT AND PLAN:  1. Preoperative risk stratification: He does not demonstrate any signs or symptoms of coronary artery disease. Given his history of three prior strokes, I will obtain an echocardiogram to assess left ventricular systolic function and regional wall motion. I do not feel a stress test is indicated at this time. His blood pressure is normal and he is not on antihypertensives. He is bradycardic but there are no ischemic ST segment or T-wave abnormalities on his ECG. 2. Mild bilateral carotid artery disease: Would recommend repeating Dopplers in two years. 3. Recurrent CVA: I have informed him to stop taking aspirin three times per week and after his surgery, to begin taking Plavix 75 mg daily for secondary stroke prevention.  Dispo: to be determined after echocardiogram.   Signed: Kate Sable, M.D., F.A.C.C.  04/15/2014, 10:25 AM

## 2014-04-17 ENCOUNTER — Telehealth: Payer: Self-pay | Admitting: *Deleted

## 2014-04-17 ENCOUNTER — Other Ambulatory Visit (INDEPENDENT_AMBULATORY_CARE_PROVIDER_SITE_OTHER): Payer: Worker's Compensation

## 2014-04-17 ENCOUNTER — Other Ambulatory Visit: Payer: Self-pay

## 2014-04-17 DIAGNOSIS — Z01818 Encounter for other preprocedural examination: Secondary | ICD-10-CM | POA: Diagnosis not present

## 2014-04-17 DIAGNOSIS — I499 Cardiac arrhythmia, unspecified: Secondary | ICD-10-CM | POA: Diagnosis not present

## 2014-04-17 DIAGNOSIS — I639 Cerebral infarction, unspecified: Secondary | ICD-10-CM

## 2014-04-17 NOTE — Telephone Encounter (Signed)
Left message to return call.  Patient has echo scheduled this afternoon at 4:30.  Will try to speak with patient at that time.

## 2014-04-17 NOTE — Telephone Encounter (Signed)
Returned call

## 2014-04-17 NOTE — Telephone Encounter (Signed)
Received labs on patient - recently done on 03/20/14 at [facility]. (scanned into EPIC)  Total Cholesterol - 167 HDL - 32 Triglycerides - 304 LDL - 74   Per Dr. Court Joy review - would first recommend a trial of 3 months of dietary & exercise, then repeat TG's.  If still elevated, would consider meds.

## 2014-04-21 ENCOUNTER — Telehealth: Payer: Self-pay | Admitting: *Deleted

## 2014-04-21 NOTE — Telephone Encounter (Signed)
Patient & fiance notified at Echo visit.

## 2014-04-21 NOTE — Telephone Encounter (Signed)
Message copied by Laurine Blazer on Mon Apr 21, 2014  3:18 PM ------      Message from: Kate Sable A      Created: Fri Apr 18, 2014  5:33 PM       Normal pumping function. ------

## 2014-04-21 NOTE — Telephone Encounter (Signed)
Notes Recorded by Laurine Blazer, LPN on 46/09/7996 at 3:17 PM Jeffrey Stokes (fiance) notified. Informed that pre-op clearance form will be faxed back to [facility] for pending shoulder surgery. ------

## 2014-06-24 ENCOUNTER — Telehealth: Payer: Self-pay | Admitting: *Deleted

## 2014-06-24 NOTE — Telephone Encounter (Signed)
Will defer Rx to PCP, as he is only to f/u with me prn.

## 2014-06-24 NOTE — Telephone Encounter (Signed)
Received fax from [facility] - asking for care consideration for atherosclerotic cardiovascular disease - consider adding a statin.    Forwarded to provider for advice.  See 03/20/14 labs section.  We did not have these results at the time of his last OV.

## 2014-06-26 NOTE — Telephone Encounter (Signed)
Will fax form back to [facility].

## 2014-07-17 ENCOUNTER — Ambulatory Visit (INDEPENDENT_AMBULATORY_CARE_PROVIDER_SITE_OTHER): Payer: Managed Care, Other (non HMO) | Admitting: Podiatry

## 2014-07-17 ENCOUNTER — Encounter: Payer: Self-pay | Admitting: Podiatry

## 2014-07-17 VITALS — BP 112/72 | HR 60 | Resp 16 | Ht 72.0 in | Wt 220.0 lb

## 2014-07-17 DIAGNOSIS — M79675 Pain in left toe(s): Secondary | ICD-10-CM

## 2014-07-17 DIAGNOSIS — L600 Ingrowing nail: Secondary | ICD-10-CM

## 2014-07-17 NOTE — Patient Instructions (Signed)

## 2014-07-17 NOTE — Progress Notes (Signed)
   Subjective:    Patient ID: Jeffrey Stokes, male    DOB: [birth year], 54 y.o.   MRN: [id number]  HPI Comments: 54 year old male presents the office today with complaints of a painful ingrown toenail on the left big toe. He states that he has an ingrown toenail for proximal 5 years this area has been progressive. He states that he periodically alternatives the ingrown or some the nail himself however more recently has been unable to do so. He denies any recent redness or drainage from the site. No other complaints at this time.      Review of Systems  All other systems reviewed and are negative.      Objective:   Physical Exam AAO x3, NAD DP/PT pulses palpable bilaterally, CRT less than 3 seconds Protective sensation intact with Simms Weinstein monofilament, vibratory sensation intact, Achilles tendon reflex intact There is evidence of ingrowing along the lateral portion of the hallux toenail with tenderness to palpation overlying this area. There is a small amount of overlying edema however no erythema or increase in warmth. There is no drainage or purulence expressed. No tenderness along the remaining nails. No open lesions or pre-ulcerative lesions are identified. No other areas of tenderness to bilateral lower extremities. No overlying edema, erythema, increase in warmth bilaterally. MMT 5/5, ROM WNL No pain with calf compression, swelling, warmth, erythema.         Assessment & Plan:  54 year old male lateral left hallux symptomatic ingrown toenail -Treatment options both conservative and surgical were discussed with the patient including alternatives, risks, complications. -At this time discussed partial nail avulsion with chemical matricectomy. Risks and complications were discussed the patient for which she understands and verbally consents the procedure. He states that he has not been taking the Plavix the last 4 days. Under sterile conditions a total of 2.5 mL of a one-to-one  mixture of 2% lidocaine plain and 0.5% Marcaine plain was infiltrated into the left hallux after the site was properly identified. Once anesthetized, the skin was then prepped in sterile fashion. A tourniquet was then applied. Next the lateral portion the left hallux nail was then sharply excised making sure to remove the entire offending nail border. Once the entire nail border was ensured to be removed, phenol was applied under standard conditions and then copiously irrigated. Silvadene was applied followed by a dry sterile dressing. After application of the dressing the tourniquet was removed and there is found to be an immediate capillary refill time to the digit. The patient tolerated the procedure well any complications. There is no signs of infection noted during the procedure. Post procedure instructions were discussed with the patient for which she verbally understood. Monitor for any signs or symptoms of infection and directed to call the office immediately should any occur or go to the ER. -Follow-up in one week for nail check or sooner should any problems arise. In the meantime, encouraged to call the office with any questions, concerns, change in symptoms.

## 2014-07-20 ENCOUNTER — Encounter: Payer: Self-pay | Admitting: Podiatry

## 2014-07-24 ENCOUNTER — Ambulatory Visit (INDEPENDENT_AMBULATORY_CARE_PROVIDER_SITE_OTHER): Payer: Managed Care, Other (non HMO) | Admitting: Podiatry

## 2014-07-24 ENCOUNTER — Encounter: Payer: Self-pay | Admitting: Podiatry

## 2014-07-24 VITALS — HR 63 | Resp 18

## 2014-07-24 DIAGNOSIS — L600 Ingrowing nail: Secondary | ICD-10-CM

## 2014-07-24 NOTE — Patient Instructions (Signed)

## 2014-07-29 ENCOUNTER — Encounter: Payer: Self-pay | Admitting: Podiatry

## 2014-07-29 NOTE — Progress Notes (Signed)
Patient ID: Jeffrey Stokes, male   DOB: 11-30-1960, 54 y.o.   MRN: [id number]  Subjective:  54 year old male returns the office today for evaluation of left hallux partial nail avulsion. He states that since last appointment he is doing well. He has had no significant tenderness to the area and he has had no drainage. He denies any redness or streaking. He has been continuing with Epson salt soaks in about appointment dressings.Denies any systemic complaints such as fevers, chills, nausea, vomiting. No acute changes since last appointment, and no other complaints at this time.   Objective: AAO x3, NAD DP/PT pulses palpable bilaterally, CRT less than 3 seconds Protective sensation intact with Simms Weinstein monofilament, vibratory sensation intact, Achilles tendon reflex intact Left hallux status post partial nail avulsion which is healing well this time. There is no tenderness to palpation overlying the area and there is no drainage/purulence identified. No surrounding erythema or ascending cellulitis. No areas of pinpoint bony tenderness or pain with vibratory sensation bilaterally. MMT 5/5, ROM WNL. No edema, erythema, increase in warmth to bilateral lower extremities.  No other open lesions or pre-ulcerative lesions.  No pain with calf compression, swelling, warmth, erythema  Assessment: 54 year old male 1 week status post left partial nail avulsion with chemical matrixectomy, doing well  Plan: -All treatment options discussed with the patient including all alternatives, risks, complications.  -Recommended patient to continue twice a day Epson salt soaks followed by an appointment a Band-Aid daily until the area is completely healed. Can leave the area uncovered at night. Continue to monitor for any signs or symptoms of infection and directed to call the office immediately should any occur. Follow-up in 2 weeks of the area has not completely healed or sooner if there are any problems. -Patient  encouraged to call the office with any questions, concerns, change in symptoms.

## 2014-11-13 ENCOUNTER — Other Ambulatory Visit: Payer: Self-pay | Admitting: *Deleted

## 2014-11-13 MED ORDER — CLOPIDOGREL BISULFATE 75 MG PO TABS: 75.0000 mg | ORAL_TABLET | Freq: Every day | ORAL | Status: DC

## 2014-12-03 ENCOUNTER — Other Ambulatory Visit: Payer: Self-pay | Admitting: Internal Medicine

## 2014-12-03 ENCOUNTER — Ambulatory Visit (INDEPENDENT_AMBULATORY_CARE_PROVIDER_SITE_OTHER): Payer: Managed Care, Other (non HMO) | Admitting: Internal Medicine

## 2014-12-03 ENCOUNTER — Encounter: Payer: Self-pay | Admitting: Internal Medicine

## 2014-12-03 VITALS — BP 104/60 | HR 68 | Temp 98.1°F | Ht 72.0 in | Wt 216.6 lb

## 2014-12-03 DIAGNOSIS — J019 Acute sinusitis, unspecified: Secondary | ICD-10-CM

## 2014-12-03 DIAGNOSIS — B009 Herpesviral infection, unspecified: Secondary | ICD-10-CM | POA: Insufficient documentation

## 2014-12-03 DIAGNOSIS — I69359 Hemiplegia and hemiparesis following cerebral infarction affecting unspecified side: Secondary | ICD-10-CM | POA: Insufficient documentation

## 2014-12-03 DIAGNOSIS — M67919 Unspecified disorder of synovium and tendon, unspecified shoulder: Secondary | ICD-10-CM | POA: Insufficient documentation

## 2014-12-03 DIAGNOSIS — I639 Cerebral infarction, unspecified: Secondary | ICD-10-CM | POA: Insufficient documentation

## 2014-12-03 DIAGNOSIS — Z8673 Personal history of transient ischemic attack (TIA), and cerebral infarction without residual deficits: Secondary | ICD-10-CM | POA: Insufficient documentation

## 2014-12-03 DIAGNOSIS — G4733 Obstructive sleep apnea (adult) (pediatric): Secondary | ICD-10-CM | POA: Insufficient documentation

## 2014-12-03 MED ORDER — AMOXICILLIN-POT CLAVULANATE 875-125 MG PO TABS: 1.0000 | ORAL_TABLET | Freq: Two times a day (BID) | ORAL | Status: DC

## 2014-12-03 NOTE — Patient Instructions (Addendum)
Resume using Flonase; use benzonate perles for cough especially at night Take all the antibiotics - with food.

## 2014-12-03 NOTE — Progress Notes (Signed)
Date:  12/03/2014   Name:  Jeffrey Stokes   DOB:  June 23, 1961   MRN:  [id number]   Chief Complaint: Sinusitis and Cough   History of Present Illness:  This is a 54 y.o. male who is presenting with sinusitis. Sinusitis This is a new problem. The current episode started in the past 7 days. The problem has been gradually worsening since onset. There has been no fever. Associated symptoms include chills, coughing (thick yellow phlegm), diaphoresis, ear pain, a hoarse voice, sneezing, a sore throat and swollen glands. Pertinent negatives include no headaches. Past treatments include acetaminophen. The treatment provided no relief.  Cough Associated symptoms include chills, ear pain and a sore throat. Pertinent negatives include no chest pain or headaches.    Review of Systems:  Review of Systems  Constitutional: Positive for chills and diaphoresis.  HENT: Positive for ear pain, hoarse voice, sneezing and sore throat. Negative for tinnitus.   Eyes: Negative for discharge.  Respiratory: Positive for cough (thick yellow phlegm).   Cardiovascular: Negative for chest pain and palpitations.  Neurological: Negative for dizziness and headaches.    Patient Active Problem List   Diagnosis Date Noted  . Bilateral carotid artery disease 04/15/2014  . Knee pain 03/03/2011  . Knee stiffness 03/03/2011  . Pain in joint, shoulder region 03/03/2011  . Sprain and strain of unspecified site of shoulder and upper arm 03/03/2011  . Muscle weakness (generalized) 03/03/2011    Prior to Admission medications   Medication Sig Start Date End Date Taking? Authorizing Provider  Ascorbic Acid (VITAMIN C PO) Take by mouth daily.   Yes Historical Provider, MD  celecoxib (CELEBREX) 200 MG capsule Take 1 capsule by mouth daily as needed. 10/30/14  Yes Historical Provider, MD  Cholecalciferol (VITAMIN D PO) Take by mouth daily.   Yes Historical Provider, MD  clopidogrel (PLAVIX) 75 MG tablet Take 1 tablet (75 mg total)  by mouth daily. 11/13/14  Yes Herminio Commons, MD  Cyanocobalamin (B-12 PO) Take by mouth daily.   Yes Historical Provider, MD  Magnesium 250 MG TABS Take by mouth daily.   Yes Historical Provider, MD  methocarbamol (ROBAXIN) 500 MG tablet  04/26/14  Yes Historical Provider, MD  Multiple Vitamins-Minerals (MULTIVITAMIN ADULT PO) Take by mouth daily.   Yes Historical Provider, MD  Omega-3 Fatty Acids (OMEGA 3 PO) Take by mouth daily.   Yes Historical Provider, MD  POTASSIUM PO Take by mouth daily.   Yes Historical Provider, MD  valACYclovir (VALTREX) 1000 MG tablet Take 1 tablet by mouth daily. 11/17/14  Yes Historical Provider, MD  predniSONE (DELTASONE) 5 MG tablet Take 1 tablet by mouth daily. 11/20/14   Historical Provider, MD  traMADol Veatrice Bourbon) 50 MG tablet  04/25/14   Historical Provider, MD    Allergies  Allergen Reactions  . Hydrocodone Anxiety    Anxiety attacks  . Norco [Hydrocodone-Acetaminophen] Nausea Only    Past Surgical History  Procedure Laterality Date  . Shoulder arthroscopy Left 03/2014  . Tonsillectomy      History  Substance Use Topics  . Smoking status: Former Smoker -- 2.00 packs/day for 33 years    Types: Cigarettes    Start date: 06/28/1971    Quit date: 12/25/2005  . Smokeless tobacco: Former Systems developer    Types: Chew  . Alcohol Use: No     Comment: Quit a long time ago     Medication list has been reviewed and updated.  Physical Examination:  Physical Exam  Constitutional: He appears well-developed and well-nourished.  HENT:  Right Ear: Tympanic membrane and external ear normal.  Left Ear: Tympanic membrane and external ear normal.  Nose: Right sinus exhibits maxillary sinus tenderness and frontal sinus tenderness. Left sinus exhibits maxillary sinus tenderness and frontal sinus tenderness.  Mouth/Throat: Uvula is midline. Posterior oropharyngeal edema present. No oropharyngeal exudate.  Eyes: Conjunctivae are normal.  Neck: Normal range of  motion. Neck supple. No thyromegaly present.  Cardiovascular: Normal rate, regular rhythm and normal heart sounds.   Pulmonary/Chest: Effort normal and breath sounds normal. He has no wheezes.  Lymphadenopathy:    He has no cervical adenopathy.  Nursing note and vitals reviewed.   BP 104/60 mmHg  Pulse 68  Temp(Src) 98.1 F (36.7 C)  Ht 6' (1.829 m)  Wt 216 lb 9.6 oz (98.249 kg)  BMI 29.37 kg/m2  SpO2 98%  Assessment and Plan:  1. Acute sinusitis, recurrence not specified, unspecified location Resume flonase nasal spray; use tessalon perles as needed for cough - amoxicillin-clavulanate (AUGMENTIN) 875-125 MG per tablet; Take 1 tablet by mouth 2 (two) times daily.  Dispense: 20 tablet; Refill: 0   Halina Maidens, MD [facility]  12/03/2014

## 2015-03-31 ENCOUNTER — Encounter: Payer: Self-pay | Admitting: Internal Medicine

## 2015-03-31 ENCOUNTER — Ambulatory Visit (INDEPENDENT_AMBULATORY_CARE_PROVIDER_SITE_OTHER): Payer: Managed Care, Other (non HMO) | Admitting: Internal Medicine

## 2015-03-31 ENCOUNTER — Other Ambulatory Visit: Payer: Self-pay | Admitting: Internal Medicine

## 2015-03-31 VITALS — BP 120/60 | HR 72 | Ht 72.0 in | Wt 208.4 lb

## 2015-03-31 DIAGNOSIS — F329 Major depressive disorder, single episode, unspecified: Secondary | ICD-10-CM | POA: Diagnosis not present

## 2015-03-31 DIAGNOSIS — F410 Panic disorder [episodic paroxysmal anxiety] without agoraphobia: Secondary | ICD-10-CM | POA: Diagnosis not present

## 2015-03-31 DIAGNOSIS — F32A Depression, unspecified: Secondary | ICD-10-CM

## 2015-03-31 DIAGNOSIS — K219 Gastro-esophageal reflux disease without esophagitis: Secondary | ICD-10-CM | POA: Insufficient documentation

## 2015-03-31 MED ORDER — DULOXETINE HCL 60 MG PO CPEP: 60.0000 mg | ORAL_CAPSULE | Freq: Every day | ORAL | Status: DC

## 2015-03-31 NOTE — Progress Notes (Signed)
Date:  03/31/2015   Name:  Jeffrey Stokes   DOB:  10/16/1960   MRN:  [id number]   Chief Complaint: Anxiety Anxiety Presents for initial visit. The problem has been gradually worsening. Symptoms include depressed mood, nervous/anxious behavior, palpitations and panic. Patient reports no chest pain, shortness of breath or suicidal ideas. Symptoms occur constantly. The severity of symptoms is causing significant distress.   Past treatments include nothing.   Patient is having a lot of problems with his left shoulder. He had a severe rotator cuff and muscle tear that was repaired. However he did not get a good result. He is working on limited activities. He is having chronic pain. He is currently waiting for second opinion but may be on 100% disability. These issues are making his anxiety and depression worse. He reports a long history of anxiety and disrupted sleep his never been on medication.  Review of Systems:  Review of Systems  Respiratory: Negative for cough, shortness of breath and wheezing.   Cardiovascular: Positive for palpitations. Negative for chest pain and leg swelling.  Musculoskeletal: Positive for arthralgias (and chronic shoulder pain on the left).  Neurological: Negative for light-headedness and numbness.  Psychiatric/Behavioral: Positive for sleep disturbance, dysphoric mood and agitation. Negative for suicidal ideas and self-injury. The patient is nervous/anxious.     Patient Active Problem List   Diagnosis Date Noted  . LPRD (laryngopharyngeal reflux disease) 03/31/2015  . Cerebral vascular accident ([place]) 12/03/2014  . Herpes 12/03/2014  . Obstructive apnea 12/03/2014  . Disorder of rotator cuff 12/03/2014  . Bilateral carotid artery disease ([place]) 04/15/2014  . Knee pain 03/03/2011  . Knee stiffness 03/03/2011  . Pain in joint, shoulder region 03/03/2011  . Sprain and strain of unspecified site of shoulder and upper arm 03/03/2011  . Muscle weakness (generalized)  03/03/2011    Prior to Admission medications   Medication Sig Start Date End Date Taking? Authorizing Provider  Ascorbic Acid (VITAMIN C PO) Take by mouth daily.    Historical Provider, MD  celecoxib (CELEBREX) 200 MG capsule Take 1 capsule by mouth daily as needed. 10/30/14   Historical Provider, MD  Cholecalciferol (VITAMIN D PO) Take by mouth daily.    Historical Provider, MD  clopidogrel (PLAVIX) 75 MG tablet Take 1 tablet (75 mg total) by mouth daily. 11/13/14   Herminio Commons, MD  Cyanocobalamin (B-12 PO) Take by mouth daily.    Historical Provider, MD  fluticasone Asencion Islam) 50 MCG/ACT nasal spray  12/25/14   Historical Provider, MD  gabapentin (NEURONTIN) 300 MG capsule Take 1 capsule by mouth at bedtime. 01/22/15   Historical Provider, MD  Magnesium 250 MG TABS Take by mouth daily.    Historical Provider, MD  methocarbamol (ROBAXIN) 500 MG tablet  04/26/14   Historical Provider, MD  Multiple Vitamins-Minerals (MULTIVITAMIN ADULT PO) Take by mouth daily.    Historical Provider, MD  Omega-3 Fatty Acids (OMEGA 3 PO) Take by mouth daily.    Historical Provider, MD  POTASSIUM PO Take by mouth daily.    Historical Provider, MD  valACYclovir (VALTREX) 1000 MG tablet Take 1 tablet by mouth daily. 11/17/14   Historical Provider, MD    Allergies  Allergen Reactions  . Hydrocodone Anxiety    Anxiety attacks  . Norco [Hydrocodone-Acetaminophen] Nausea Only    Past Surgical History  Procedure Laterality Date  . Shoulder arthroscopy Left 03/2014  . Tonsillectomy      Social History  Substance Use Topics  . Smoking status:  Former Smoker -- 2.00 packs/day for 33 years    Types: Cigarettes    Start date: 06/28/1971    Quit date: 12/25/2005  . Smokeless tobacco: Former Systems developer    Types: Chew  . Alcohol Use: No     Comment: Quit a long time ago     Medication list has been reviewed and updated.  Physical Examination:  Physical Exam  Constitutional: He is oriented to person, place,  and time. He appears well-developed and well-nourished.  Cardiovascular: Normal rate, regular rhythm and normal heart sounds.   Pulmonary/Chest: Effort normal and breath sounds normal. He has no wheezes.  Musculoskeletal: He exhibits no edema.  Neurological: He is alert and oriented to person, place, and time.  Psychiatric: His speech is normal. His mood appears anxious. Cognition and memory are normal.  Nursing note and vitals reviewed.   BP 120/60 mmHg  Pulse 72  Ht 6' (1.829 m)  Wt 208 lb 6.4 oz (94.53 kg)  BMI 28.26 kg/m2  Assessment and Plan: 1. Depression Begin Cymbalta 60 mg once daily This may benefit depression and anxiety as well as pain  2. Panic disorder without agoraphobia Will not prescribe short-acting medications at this time since patient has concern over addiction to control medication Will reassess next visit his response to [place], MD [facility]  03/31/2015

## 2015-04-07 ENCOUNTER — Ambulatory Visit (INDEPENDENT_AMBULATORY_CARE_PROVIDER_SITE_OTHER): Payer: Managed Care, Other (non HMO) | Admitting: Cardiovascular Disease

## 2015-04-07 ENCOUNTER — Encounter: Payer: Self-pay | Admitting: Cardiovascular Disease

## 2015-04-07 VITALS — BP 120/88 | HR 62 | Ht 72.0 in | Wt 211.0 lb

## 2015-04-07 DIAGNOSIS — I6389 Other cerebral infarction: Secondary | ICD-10-CM

## 2015-04-07 DIAGNOSIS — Z7189 Other specified counseling: Secondary | ICD-10-CM

## 2015-04-07 DIAGNOSIS — R002 Palpitations: Secondary | ICD-10-CM

## 2015-04-07 DIAGNOSIS — I779 Disorder of arteries and arterioles, unspecified: Secondary | ICD-10-CM

## 2015-04-07 DIAGNOSIS — I638 Other cerebral infarction: Secondary | ICD-10-CM | POA: Diagnosis not present

## 2015-04-07 DIAGNOSIS — I739 Peripheral vascular disease, unspecified: Principal | ICD-10-CM

## 2015-04-07 NOTE — Patient Instructions (Signed)
Continue all current medications. Follow up as needed  

## 2015-04-07 NOTE — Progress Notes (Signed)
Patient ID: Jeffrey Stokes, male   DOB: 12-23-1960, 54 y.o.   MRN: [id number]      SUBJECTIVE: The patient is a 54 year old male with a history of panic disorder, anxiety and depression. He also has a history of recurrent CVA. I initially evaluated him in October 2015. I initiated him on Plavix at that time for secondary stroke prevention. Echocardiogram on 04/17/14 demonstrated normal left ventricular systolic function and regional wall motion, LVEF 55-60%. Denies exertional chest pain. Had a panic attack at work yesterday and felt like his heart was racing. As soon as he calmed down, symptoms subsided.  ECG performed in the office today demonstrates sinus bradycardia, heart rate 55 bpm, with no ischemic ST segment or T-wave abnormalities.    Review of Systems: As per "subjective", otherwise negative.  Allergies  Allergen Reactions  . Hydrocodone Anxiety    Anxiety attacks  . Norco [Hydrocodone-Acetaminophen] Nausea Only    Current Outpatient Prescriptions  Medication Sig Dispense Refill  . Ascorbic Acid (VITAMIN C PO) Take by mouth daily.    . cetirizine (ZYRTEC) 10 MG tablet Take 10 mg by mouth daily.    . Cholecalciferol (VITAMIN D PO) Take by mouth daily.    . clopidogrel (PLAVIX) 75 MG tablet Take 1 tablet (75 mg total) by mouth daily. 90 tablet 3  . Cyanocobalamin (B-12 PO) Take by mouth daily.    . diphenhydrAMINE (BENADRYL) 50 MG capsule Take 50 mg by mouth every 6 (six) hours as needed.    . DULoxetine (CYMBALTA) 60 MG capsule Take 1 capsule (60 mg total) by mouth daily. 30 capsule 3  . fluticasone (FLONASE) 50 MCG/ACT nasal spray     . gabapentin (NEURONTIN) 300 MG capsule Take 1 capsule by mouth at bedtime.    . Garlic 1829 MG CAPS Take 1,000 mg by mouth daily.    . Magnesium 250 MG TABS Take by mouth daily.    . Multiple Vitamins-Minerals (MULTIVITAMIN ADULT PO) Take by mouth daily.    . Omega-3 Fatty Acids (OMEGA 3 PO) Take 1,200 mg by mouth 3 (three) times daily.       Marland Kitchen POTASSIUM PO Take by mouth daily.    . valACYclovir (VALTREX) 1000 MG tablet Take 1 tablet by mouth daily.     No current facility-administered medications for this visit.    No past medical history on file.  Past Surgical History  Procedure Laterality Date  . Shoulder arthroscopy Left 03/2014  . Tonsillectomy      Social History   Social History  . Marital Status: Single    Spouse Name: N/A  . Number of Children: N/A  . Years of Education: N/A   Occupational History  . Not on file.   Social History Main Topics  . Smoking status: Former Smoker -- 2.00 packs/day for 33 years    Types: Cigarettes    Start date: 06/28/1971    Quit date: 12/25/2005  . Smokeless tobacco: Former Systems developer    Types: Chew  . Alcohol Use: No     Comment: Quit a long time ago  . Drug Use: No  . Sexual Activity: Not on file   Other Topics Concern  . Not on file   Social History Narrative     Filed Vitals:   04/07/15 0822  BP: 120/88  Pulse: 62  Height: 6' (1.829 m)  Weight: 211 lb (95.709 kg)  SpO2: 98%    PHYSICAL EXAM General: NAD HEENT: Normal. Neck: No JVD, no  thyromegaly. Lungs: Clear to auscultation bilaterally with normal respiratory effort. CV: Nondisplaced PMI.  Regular rate and rhythm, normal S1/S2, no S3/S4, no murmur. No pretibial or periankle edema.  No carotid bruit.  Normal pedal pulses.  Abdomen: Soft, nontender, no hepatosplenomegaly, no distention.  Neurologic: Alert and oriented x 3.  Psych: Normal affect. Skin: Normal. Musculoskeletal: Normal range of motion, no gross deformities. Extremities: No clubbing or cyanosis.   ECG: Most recent ECG reviewed.      ASSESSMENT AND PLAN: 1. History of recurrent CVA: Continue Plavix for secondary prevention.  2. Mild carotid artery disease: Can consider repeating Dopplers in one year.  3. Palpitations/anxiety attack: No workup is necessary.  Dispo: f/u prn.   Kate Sable, M.D., F.A.C.C.

## 2015-05-05 ENCOUNTER — Other Ambulatory Visit: Payer: Self-pay | Admitting: Internal Medicine

## 2015-05-12 ENCOUNTER — Encounter: Payer: Self-pay | Admitting: Internal Medicine

## 2015-05-12 ENCOUNTER — Ambulatory Visit (INDEPENDENT_AMBULATORY_CARE_PROVIDER_SITE_OTHER): Payer: Managed Care, Other (non HMO) | Admitting: Internal Medicine

## 2015-05-12 VITALS — BP 102/62 | HR 72 | Ht 72.0 in | Wt 207.6 lb

## 2015-05-12 DIAGNOSIS — M6289 Other specified disorders of muscle: Secondary | ICD-10-CM

## 2015-05-12 DIAGNOSIS — R531 Weakness: Secondary | ICD-10-CM

## 2015-05-12 DIAGNOSIS — K649 Unspecified hemorrhoids: Secondary | ICD-10-CM | POA: Insufficient documentation

## 2015-05-12 DIAGNOSIS — F410 Panic disorder [episodic paroxysmal anxiety] without agoraphobia: Secondary | ICD-10-CM

## 2015-05-12 DIAGNOSIS — I6350 Cerebral infarction due to unspecified occlusion or stenosis of unspecified cerebral artery: Secondary | ICD-10-CM

## 2015-05-12 NOTE — Progress Notes (Signed)
Date:  05/12/2015   Name:  Jeffrey Stokes   DOB:  [birth year]   MRN:  [id number]   Chief Complaint: Anxiety Started Cymbalta last visit but did not tolerate it due to constipation.  He is now using some relaxation techniques and deep breathing exercises which has helped.  He does not want to try any other medication. Hemorrhoids - patient complains of hemorrhoids that come and go. He has swelling discomfort without bleeding. He'll use a over-the-counter hemorrhoid medication with only temporary benefit. He thinks he ought to have something more definitive done. Old stroke with left-sided residual - he remains on Plavix for anticoagulation. His left-sided weakness is stable. He is having more difficulty walking distances carrying groceries etc. He's requesting a handicapped parking permit. He complains of fatigue with extreme exertion but no wheezing, chest tightness, chest pains, dizziness, or leg cramps.  Review of Systems  Constitutional: Positive for fatigue (with extreme exertion). Negative for fever, chills and diaphoresis.  HENT: Negative for hearing loss.   Eyes: Negative for visual disturbance.  Respiratory: Negative for cough, choking, chest tightness, shortness of breath and wheezing.   Cardiovascular: Negative for chest pain, palpitations and leg swelling.  Gastrointestinal: Negative for abdominal pain.  Musculoskeletal: Negative for arthralgias.  Neurological: Positive for weakness. Negative for speech difficulty, light-headedness, numbness and headaches.  Hematological: Negative for adenopathy. Does not bruise/bleed easily.  Psychiatric/Behavioral: Negative for sleep disturbance and dysphoric mood. The patient is nervous/anxious (improved).     Patient Active Problem List   Diagnosis Date Noted  . Right sided weakness 05/12/2015  . Hemorrhoid 05/12/2015  . LPRD (laryngopharyngeal reflux disease) 03/31/2015  . Depression 03/31/2015  . Panic disorder without agoraphobia 03/31/2015   . Cerebral vascular accident ([place]) 12/03/2014  . Herpes 12/03/2014  . Obstructive apnea 12/03/2014  . Disorder of rotator cuff 12/03/2014  . Bilateral carotid artery disease ([place]) 04/15/2014  . Knee pain 03/03/2011  . Knee stiffness 03/03/2011    Prior to Admission medications   Medication Sig Start Date End Date Taking? Authorizing Provider  Ascorbic Acid (VITAMIN C PO) Take by mouth daily.   Yes Historical Provider, MD  cetirizine (ZYRTEC) 10 MG tablet Take 10 mg by mouth daily.   Yes Historical Provider, MD  Cholecalciferol (VITAMIN D PO) Take by mouth daily.   Yes Historical Provider, MD  clopidogrel (PLAVIX) 75 MG tablet Take 1 tablet (75 mg total) by mouth daily. 11/13/14  Yes Herminio Commons, MD  Cyanocobalamin (B-12 PO) Take by mouth daily.   Yes Historical Provider, MD  diphenhydrAMINE (BENADRYL) 50 MG capsule Take 50 mg by mouth every 6 (six) hours as needed.   Yes Historical Provider, MD  DULoxetine (CYMBALTA) 60 MG capsule Take 1 capsule (60 mg total) by mouth daily. 03/31/15  Yes Glean Hess, MD  fluticasone Asencion Islam) 50 MCG/ACT nasal spray  12/25/14  Yes Historical Provider, MD  gabapentin (NEURONTIN) 300 MG capsule Take 1 capsule by mouth at bedtime. 01/22/15  Yes Historical Provider, MD  Garlic 123XX123 MG CAPS Take 1,000 mg by mouth daily.   Yes Historical Provider, MD  Magnesium 250 MG TABS Take by mouth daily.   Yes Historical Provider, MD  Multiple Vitamins-Minerals (MULTIVITAMIN ADULT PO) Take by mouth daily.   Yes Historical Provider, MD  Omega-3 Fatty Acids (OMEGA 3 PO) Take 1,200 mg by mouth 3 (three) times daily.    Yes Historical Provider, MD  POTASSIUM PO Take by mouth daily.   Yes Historical Provider, MD  valACYclovir (VALTREX) 1000 MG tablet TAKE 1 TABLET DAILY 05/05/15  Yes Glean Hess, MD    Allergies  Allergen Reactions  . Hydrocodone Anxiety    Anxiety attacks  . Norco [Hydrocodone-Acetaminophen] Nausea Only  . Cymbalta [Duloxetine Hcl]      Constipation     Past Surgical History  Procedure Laterality Date  . Shoulder arthroscopy Left 03/2014  . Tonsillectomy      Social History  Substance Use Topics  . Smoking status: Former Smoker -- 2.00 packs/day for 33 years    Types: Cigarettes    Start date: 06/28/1971    Quit date: 12/25/2005  . Smokeless tobacco: Former Systems developer    Types: Chew  . Alcohol Use: No     Comment: Quit a long time ago    Medication list has been reviewed and updated.   Physical Exam  Constitutional: He is oriented to person, place, and time. He appears well-developed. No distress.  HENT:  Head: Normocephalic and atraumatic.  Eyes: Right eye exhibits no discharge. Left eye exhibits no discharge. No scleral icterus.  Neck: Normal range of motion. Neck supple. Carotid bruit is not present. No thyromegaly present.  Cardiovascular: Normal rate, regular rhythm and normal heart sounds.   No extrasystoles are present. Exam reveals decreased pulses. Exam reveals no gallop.   No murmur heard. Pulses:      Posterior tibial pulses are 1+ on the right side, and 1+ on the left side.  Pulmonary/Chest: Effort normal and breath sounds normal. No respiratory distress. He has no wheezes.  Musculoskeletal: Normal range of motion.  No calf swelling cord or tenderness bilaterally  Neurological: He is alert and oriented to person, place, and time. No cranial nerve deficit or sensory deficit.  Reflex Scores:      Bicep reflexes are 1+ on the right side and 2+ on the left side.      Patellar reflexes are 1+ on the right side and 2+ on the left side. Decreased strength 4+/5 in right upper ext and right lower ext.  Skin: Skin is warm and dry. No rash noted.  Psychiatric: He has a normal mood and affect. His speech is normal and behavior is normal. Thought content normal.  Nursing note and vitals reviewed.   BP 102/62 mmHg  Pulse 72  Ht 6' (1.829 m)  Wt 207 lb 9.6 oz (94.167 kg)  BMI 28.15 kg/m2  Assessment and  Plan: 1. Cerebrovascular accident (CVA) due to occlusion of cerebral artery (HCC) Continue Plavix anticoagulation Continue activity as tolerated to maintain muscle strength and balance If fatigue worsens return for further evaluation  2. Right sided weakness Secondary to old CVA; handicap parking permit form completed  3. Hemorrhoids, unspecified hemorrhoid type Patient declines rectal exam today; will refer to general surgery - Ambulatory referral to General Surgery  4. Panic disorder without agoraphobia Continue self relaxation and biofeedback techniques No new medications prescribed at this time  Halina Maidens, MD [facility]  05/12/2015

## 2015-05-18 ENCOUNTER — Other Ambulatory Visit: Payer: Self-pay

## 2015-05-18 ENCOUNTER — Ambulatory Visit (INDEPENDENT_AMBULATORY_CARE_PROVIDER_SITE_OTHER): Payer: Managed Care, Other (non HMO) | Admitting: Surgery

## 2015-05-18 ENCOUNTER — Encounter: Payer: Self-pay | Admitting: Surgery

## 2015-05-18 VITALS — BP 125/69 | HR 76 | Temp 97.8°F | Ht 72.0 in | Wt 208.0 lb

## 2015-05-18 DIAGNOSIS — K6289 Other specified diseases of anus and rectum: Secondary | ICD-10-CM

## 2015-05-18 MED ORDER — HYDROCORTISONE ACE-PRAMOXINE 1-1 % RE FOAM: 1.0000 | Freq: Two times a day (BID) | RECTAL | Status: DC

## 2015-05-18 NOTE — Progress Notes (Signed)
  Surgical Consultation  05/18/2015  Jeffrey Stokes is an 54 y.o. male.   CC: Rectal pain  HPI: This patient with off-and-on rectal pain over the last few months he describes no bleeding and has used Preparation H suppositories but only on a once a week basis if that. That the pain is very common and nearly constant it is not worsened by bowel movement. He also states that he has a "rash" when further questioned about that he states it is a burning sensation. Has never had a colonoscopy. Denies melena. Is on Plavix for multiple strokes  Past Medical History  Diagnosis Date  . Allergy   . Stroke [facility])     3 strokes last one on 2012    Past Surgical History  Procedure Laterality Date  . Shoulder arthroscopy Left 03/2014  . Tonsillectomy      Family History  Problem Relation Age of Onset  . Osteoarthritis Father     Social History:  reports that he quit smoking about 9 years ago. His smoking use included Cigarettes. He started smoking about 43 years ago. He has a 66 pack-year smoking history. He has quit using smokeless tobacco. His smokeless tobacco use included Chew. He reports that he does not drink alcohol or use illicit drugs.  Allergies:  Allergies  Allergen Reactions  . Hydrocodone Anxiety    Anxiety attacks  . Norco [Hydrocodone-Acetaminophen] Nausea Only  . Cymbalta [Duloxetine Hcl]     Constipation     Medications reviewed.   Review of Systems:   Review of Systems  Constitutional: Negative.   HENT: Negative.   Eyes: Negative.   Respiratory: Negative.   Cardiovascular: Negative.   Gastrointestinal: Negative.  Negative for blood in stool and melena.  Genitourinary: Negative.   Musculoskeletal: Negative.   Skin: Negative.   Neurological: Negative.   Endo/Heme/Allergies: Negative.   Psychiatric/Behavioral: Negative.      Physical Exam:  BP 125/69 mmHg  Pulse 76  Temp(Src) 97.8 F (36.6 C) (Oral)  Ht 6' (1.829 m)  Wt 208 lb (94.348 kg)  BMI 28.20  kg/m2  Physical Exam  Constitutional: He is oriented to person, place, and time and well-developed, well-nourished, and in no distress. No distress.  HENT:  Head: Normocephalic and atraumatic.  Eyes: Right eye exhibits no discharge. Left eye exhibits no discharge. No scleral icterus.  Neck: Normal range of motion. Neck supple.  Cardiovascular: Normal rate and regular rhythm.   Pulmonary/Chest: Effort normal and breath sounds normal. No respiratory distress.  Abdominal: Soft. He exhibits no distension. There is no tenderness.  Genitourinary:  Rectal performed no external hemorrhoids noted minimal internal hemorrhoids no fissure palpable patient appears quite tender however. No Mass appreciated  Neurological: He is alert and oriented to person, place, and time. Gait normal.  Skin: Skin is warm and dry. He is not diaphoretic.  Psychiatric: Affect normal.      No results found for this or any previous visit (from the past 48 hour(s)). No results found.  Assessment/Plan:  Patient needs colonoscopy will arrange. We'll give the patient prescription for Proctofoam [facility] and see back in 2 weeks   Florene Glen, MD, FACS

## 2015-05-26 ENCOUNTER — Telehealth: Payer: Self-pay

## 2015-05-26 NOTE — Telephone Encounter (Signed)
Called pt to let him know his cardiologist has faxed back blood thinner request form and he is to stop the Plavix 75mg  3 days prior to procedure. Pt stated he had already stopped it.

## 2015-05-28 NOTE — Discharge Instructions (Signed)

## 2015-05-29 ENCOUNTER — Other Ambulatory Visit: Payer: Self-pay

## 2015-06-01 ENCOUNTER — Encounter
Admission: RE | Disposition: A | Discharge status: Home / Self Care | Payer: Self-pay | Source: Ambulatory Visit | Attending: Gastroenterology

## 2015-06-01 ENCOUNTER — Ambulatory Visit
Admission: RE | Admit: 2015-06-01 | Discharge: 2015-06-01 | Disposition: A | Discharge status: Home / Self Care | Payer: Managed Care, Other (non HMO) | Source: Ambulatory Visit | Attending: Gastroenterology | Admitting: Gastroenterology

## 2015-06-01 ENCOUNTER — Ambulatory Visit: Payer: Managed Care, Other (non HMO) | Admitting: Anesthesiology

## 2015-06-01 ENCOUNTER — Other Ambulatory Visit: Payer: Self-pay | Admitting: Gastroenterology

## 2015-06-01 DIAGNOSIS — I639 Cerebral infarction, unspecified: Secondary | ICD-10-CM | POA: Insufficient documentation

## 2015-06-01 DIAGNOSIS — D124 Benign neoplasm of descending colon: Secondary | ICD-10-CM | POA: Diagnosis not present

## 2015-06-01 DIAGNOSIS — D123 Benign neoplasm of transverse colon: Secondary | ICD-10-CM | POA: Diagnosis not present

## 2015-06-01 DIAGNOSIS — Z79899 Other long term (current) drug therapy: Secondary | ICD-10-CM | POA: Insufficient documentation

## 2015-06-01 DIAGNOSIS — D122 Benign neoplasm of ascending colon: Secondary | ICD-10-CM | POA: Diagnosis not present

## 2015-06-01 DIAGNOSIS — G4730 Sleep apnea, unspecified: Secondary | ICD-10-CM | POA: Insufficient documentation

## 2015-06-01 DIAGNOSIS — Z87891 Personal history of nicotine dependence: Secondary | ICD-10-CM | POA: Insufficient documentation

## 2015-06-01 DIAGNOSIS — Z1211 Encounter for screening for malignant neoplasm of colon: Secondary | ICD-10-CM | POA: Insufficient documentation

## 2015-06-01 DIAGNOSIS — G8191 Hemiplegia, unspecified affecting right dominant side: Secondary | ICD-10-CM | POA: Insufficient documentation

## 2015-06-01 DIAGNOSIS — K642 Third degree hemorrhoids: Secondary | ICD-10-CM | POA: Insufficient documentation

## 2015-06-01 HISTORY — DX: Unspecified hearing loss, unspecified ear: H91.90

## 2015-06-01 HISTORY — DX: Personal history of other specified conditions: Z87.898

## 2015-06-01 HISTORY — PX: COLONOSCOPY WITH PROPOFOL: SHX5780

## 2015-06-01 HISTORY — DX: Presence of dental prosthetic device (complete) (partial): Z97.2

## 2015-06-01 HISTORY — DX: Sleep apnea, unspecified: G47.30

## 2015-06-01 SURGERY — COLONOSCOPY WITH PROPOFOL
Anesthesia: Monitor Anesthesia Care | Site: Rectum | Wound class: Contaminated

## 2015-06-01 MED ORDER — PROPOFOL 10 MG/ML IV BOLUS
INTRAVENOUS | Status: DC | PRN
  Administered 2015-06-01: 30 mg via INTRAVENOUS
  Administered 2015-06-01: 20 mg via INTRAVENOUS
  Administered 2015-06-01 (×2): 30 mg via INTRAVENOUS
  Administered 2015-06-01 (×2): 20 mg via INTRAVENOUS
  Administered 2015-06-01: 80 mg via INTRAVENOUS
  Administered 2015-06-01: 30 mg via INTRAVENOUS

## 2015-06-01 MED ORDER — STERILE WATER FOR IRRIGATION IR SOLN
Status: DC | PRN
  Administered 2015-06-01: 10:00:00

## 2015-06-01 MED ORDER — LIDOCAINE HCL (CARDIAC) 20 MG/ML IV SOLN
INTRAVENOUS | Status: DC | PRN
  Administered 2015-06-01: 40 mg via INTRAVENOUS

## 2015-06-01 MED ORDER — LACTATED RINGERS IV SOLN
INTRAVENOUS | Status: DC
  Administered 2015-06-01: 09:00:00 via INTRAVENOUS

## 2015-06-01 SURGICAL SUPPLY — 28 items
CANISTER SUCT 1200ML W/VALVE (MISCELLANEOUS) ×3 IMPLANT
FCP ESCP3.2XJMB 240X2.8X (MISCELLANEOUS)
FORCEPS BIOP RAD 4 LRG CAP 4 (CUTTING FORCEPS) IMPLANT
FORCEPS BIOP RJ4 240 W/NDL (MISCELLANEOUS)
FORCEPS ESCP3.2XJMB 240X2.8X (MISCELLANEOUS) IMPLANT
GOWN CVR UNV OPN BCK APRN NK (MISCELLANEOUS) ×2 IMPLANT
GOWN ISOL THUMB LOOP REG UNIV (MISCELLANEOUS) ×4
HEMOCLIP INSTINCT (CLIP) IMPLANT
INJECTOR VARIJECT VIN23 (MISCELLANEOUS) IMPLANT
KIT CO2 TUBING (TUBING) IMPLANT
KIT DEFENDO VALVE AND CONN (KITS) IMPLANT
KIT ENDO PROCEDURE OLY (KITS) ×3 IMPLANT
LIGATOR MULTIBAND 6SHOOTER MBL (MISCELLANEOUS) IMPLANT
MARKER SPOT ENDO TATTOO 5ML (MISCELLANEOUS) IMPLANT
PAD GROUND ADULT SPLIT (MISCELLANEOUS) ×3 IMPLANT
SNARE SHORT THROW 13M SML OVAL (MISCELLANEOUS) ×3 IMPLANT
SNARE SHORT THROW 30M LRG OVAL (MISCELLANEOUS) IMPLANT
SPOT EX ENDOSCOPIC TATTOO (MISCELLANEOUS)
SUCTION POLY TRAP 4CHAMBER (MISCELLANEOUS) ×3 IMPLANT
TRAP SUCTION POLY (MISCELLANEOUS) ×3 IMPLANT
TUBING CONN 6MMX3.1M (TUBING)
TUBING SUCTION CONN 0.25 STRL (TUBING) IMPLANT
UNDERPAD 30X60 958B10 (PK) (MISCELLANEOUS) IMPLANT
VALVE BIOPSY ENDO (VALVE) IMPLANT
VARIJECT INJECTOR VIN23 (MISCELLANEOUS)
WATER AUXILLARY (MISCELLANEOUS) IMPLANT
WATER STERILE IRR 250ML POUR (IV SOLUTION) ×3 IMPLANT
WATER STERILE IRR 500ML POUR (IV SOLUTION) IMPLANT

## 2015-06-01 NOTE — Anesthesia Postprocedure Evaluation (Signed)
Anesthesia Post Note  Patient: Jeffrey Stokes  Procedure(s) Performed: Procedure(s) (LRB): COLONOSCOPY WITH PROPOFOL (N/A)  Patient location during evaluation: PACU Anesthesia Type: MAC Level of consciousness: awake and alert Pain management: pain level controlled Vital Signs Assessment: post-procedure vital signs reviewed and stable Respiratory status: spontaneous breathing, nonlabored ventilation and respiratory function stable Cardiovascular status: stable and blood pressure returned to baseline Anesthetic complications: no    Trecia Rogers

## 2015-06-01 NOTE — Op Note (Signed)
[facility] Patient Name: Jeffrey Stokes Procedure Date: 06/01/2015 9:14 AM MRN: [id number] Account #: [id number] Date of Birth: 12-30-1960 Admit Type: Outpatient Age: 54 Room: [facility] OR ROOM 01 Gender: Male Note Status: Finalized Procedure:         Colonoscopy Indications:       Screening for colorectal malignant neoplasm Providers:         [name], MD Referring MD:      Halina Maidens, MD (Referring MD) Medicines:         Propofol per Anesthesia Complications:     No immediate complications. Procedure:         Pre-Anesthesia Assessment:                    - Prior to the procedure, a History and Physical was                     performed, and patient medications and allergies were                     reviewed. The patient's tolerance of previous anesthesia                     was also reviewed. The risks and benefits of the procedure                     and the sedation options and risks were discussed with the                     patient. All questions were answered, and informed consent                     was obtained. Prior Anticoagulants: The patient has taken                     no previous anticoagulant or antiplatelet agents. ASA                     Grade Assessment: II - A patient with mild systemic                     disease. After reviewing the risks and benefits, the                     patient was deemed in satisfactory condition to undergo                     the procedure.                    After obtaining informed consent, the colonoscope was                     passed under direct vision. Throughout the procedure, the                     patient's blood pressure, pulse, and oxygen saturations                     were monitored continuously. The Olympus CF-HQ190L                     Colonoscope (S#. [id number]) was introduced through the anus  and advanced to the the cecum, identified by appendiceal         orifice and ileocecal valve. The colonoscopy was performed                     without difficulty. The patient tolerated the procedure                     well. The quality of the bowel preparation was excellent. Findings:      The perianal and digital rectal examinations were normal.      A 5 mm polyp was found in the ascending colon. The polyp was sessile.       The polyp was removed with a cold snare. Resection and retrieval were       complete.      A 4 mm polyp was found in the transverse colon. The polyp was sessile.       The polyp was removed with a cold snare. Resection and retrieval were       complete.      A 4 mm polyp was found in the descending colon. The polyp was sessile.       The polyp was removed with a cold snare. Resection and retrieval were       complete.      Non-bleeding internal hemorrhoids were found during retroflexion. The       hemorrhoids were Grade III (internal hemorrhoids that prolapse but       require manual reduction). Impression:        - One 5 mm polyp in the ascending colon. Resected and                     retrieved.                    - One 4 mm polyp in the transverse colon. Resected and                     retrieved.                    - One 4 mm polyp in the descending colon. Resected and                     retrieved.                    - Non-bleeding internal hemorrhoids. Recommendation:    - Await pathology results.                    - Repeat colonoscopy in 5 years if polyp adenoma and 10                     years if hyperplastic Procedure Code(s): --- Professional ---                    [phone], Colonoscopy, flexible; with removal of tumor(s),                     polyp(s), or other lesion(s) by snare technique Diagnosis Code(s): --- Professional ---                    Z12.11, Encounter for screening for malignant neoplasm of  colon                    D12.2, Benign neoplasm of ascending colon                     D12.3, Benign neoplasm of transverse colon                    D12.4, Benign neoplasm of descending colon CPT copyright 2014 [employer] Association. All rights reserved. The codes documented in this report are preliminary and upon coder review may  be revised to meet current compliance requirements. [name], MD 06/01/2015 9:49:03 AM This report has been signed electronically. Number of Addenda: 0 Note Initiated On: 06/01/2015 9:14 AM Scope Withdrawal Time: 0 hours 12 minutes 39 seconds  Total Procedure Duration: 0 hours 15 minutes 13 seconds       [facility]

## 2015-06-01 NOTE — H&P (Signed)
[facility]  [address] [place], [place] [address] Phone: [phone] Fax : [phone]  Primary Care Physician:  Halina Maidens, MD Primary Gastroenterologist:  Dr. Allen Norris  Pre-Procedure History & Physical: HPI:  Jeffrey Stokes is a 54 y.o. male is here for a screening colonoscopy.   Past Medical History  Diagnosis Date  . Allergy   . Stroke [facility])     3 strokes last one on 2012/ mouth tilt/ weakness right hand side  . Wears dentures   . HOH (hard of hearing)     bilateral  . History of motion sickness     laying on back  . Sleep apnea     had one sleep study, inconclusive,needs repeat    Past Surgical History  Procedure Laterality Date  . Shoulder arthroscopy Left 03/2014    bicep and nerve problems still  . Tonsillectomy      Prior to Admission medications   Medication Sig Start Date End Date Taking? Authorizing Provider  Omega 3 1000 MG CAPS Take 2,000 mg by mouth daily. am   Yes Historical Provider, MD  Ascorbic Acid (VITAMIN C PO) Take 1 tablet by mouth daily. 500 mg    Historical Provider, MD  cetirizine (ZYRTEC) 10 MG tablet Take 10 mg by mouth daily. pm    Historical Provider, MD  Cholecalciferol (VITAMIN D PO) Take 1 tablet by mouth daily. 1000 iu    Historical Provider, MD  clopidogrel (PLAVIX) 75 MG tablet Take 1 tablet (75 mg total) by mouth daily. Patient taking differently: Take 75 mg by mouth daily. am 11/13/14   Herminio Commons, MD  Cyanocobalamin (B-12 PO) Take 1 tablet by mouth daily. 1000 mcg    Historical Provider, MD  diphenhydrAMINE (BENADRYL) 50 MG capsule Take 50 mg by mouth at bedtime.     Historical Provider, MD  DULoxetine (CYMBALTA) 60 MG capsule  03/31/15   Historical Provider, MD  fluticasone (FLONASE) 50 MCG/ACT nasal spray 2 sprays as needed.  12/25/14   Historical Provider, MD  gabapentin (NEURONTIN) 300 MG capsule Take 1 capsule by mouth at bedtime. 01/22/15   Historical Provider, MD  Garlic 123XX123 MG CAPS Take 1,000  mg by mouth daily. am    Historical Provider, MD  hydrocortisone-pramoxine (PROCTOFOAM HC) rectal foam Place 1 applicator rectally 2 (two) times daily. 05/18/15   Florene Glen, MD  Magnesium 250 MG TABS Take by mouth daily. am    Historical Provider, MD  Multiple Vitamins-Minerals (MULTIVITAMIN ADULT PO) Take by mouth daily. am    Historical Provider, MD  POTASSIUM PO Take by mouth daily. 595 mg am    Historical Provider, MD  valACYclovir (VALTREX) 1000 MG tablet TAKE 1 TABLET DAILY Patient taking differently: TAKE 1 TABLET DAILY am 05/05/15   Glean Hess, MD    Allergies as of 05/18/2015 - Review Complete 05/18/2015  Allergen Reaction Noted  . Hydrocodone Anxiety 04/15/2014  . Norco [hydrocodone-acetaminophen] Nausea Only 04/15/2014  . Cymbalta [duloxetine hcl]  05/12/2015    Family History  Problem Relation Age of Onset  . Osteoarthritis Father     Social History   Social History  . Marital Status: Single    Spouse Name: N/A  . Number of Children: N/A  . Years of Education: N/A   Occupational History  . Not on file.   Social History Main Topics  . Smoking status: Former Smoker -- 2.00 packs/day for 33 years    Types: Cigarettes  Start date: 06/28/1971    Quit date: 12/25/2005  . Smokeless tobacco: Former Systems developer    Types: Chew  . Alcohol Use: No     Comment: Quit a long time ago  . Drug Use: No  . Sexual Activity: Not on file   Other Topics Concern  . Not on file   Social History Narrative    Review of Systems: See HPI, otherwise negative ROS  Physical Exam: BP 116/70 mmHg  Pulse 63  Temp(Src) 97.7 F (36.5 C) (Temporal)  Resp 16  Ht 6' (1.829 m)  Wt 202 lb (91.627 kg)  BMI 27.39 kg/m2  SpO2 99% General:   Alert,  pleasant and cooperative in NAD Head:  Normocephalic and atraumatic. Neck:  Supple; no masses or thyromegaly. Lungs:  Clear throughout to auscultation.    Heart:  Regular rate and rhythm. Abdomen:  Soft, nontender and  nondistended. Normal bowel sounds, without guarding, and without rebound.   Neurologic:  Alert and  oriented x4;  grossly normal neurologically.  Impression/Plan: Jeffrey Stokes is now here to undergo a screening colonoscopy.  Risks, benefits, and alternatives regarding colonoscopy have been reviewed with the patient.  Questions have been answered.  All parties agreeable.

## 2015-06-01 NOTE — Transfer of Care (Signed)
Immediate Anesthesia Transfer of Care Note  Patient: Jeffrey Stokes  Procedure(s) Performed: Procedure(s) with comments: COLONOSCOPY WITH PROPOFOL (N/A) - ASCENDING COLON POLYP DESCENDING COLON POLYP TRANSVERSE COLON POLYP  Patient Location: PACU  Anesthesia Type: MAC  Level of Consciousness: awake, alert  and patient cooperative  Airway and Oxygen Therapy: Patient Spontanous Breathing and Patient connected to supplemental oxygen  Post-op Assessment: Post-op Vital signs reviewed, Patient's Cardiovascular Status Stable, Respiratory Function Stable, Patent Airway and No signs of Nausea or vomiting  Post-op Vital Signs: Reviewed and stable  Complications: No apparent anesthesia complications

## 2015-06-01 NOTE — Addendum Note (Signed)
Addendum  created 06/01/15 1037 by Mayme Genta, CRNA   Modules edited: Anesthesia Medication Administration

## 2015-06-01 NOTE — Anesthesia Procedure Notes (Signed)
Procedure Name: MAC Performed by: Meghna Hagmann Pre-anesthesia Checklist: Patient identified, Emergency Drugs available, Suction available, Timeout performed and Patient being monitored Patient Re-evaluated:Patient Re-evaluated prior to inductionOxygen Delivery Method: Nasal cannula Placement Confirmation: positive ETCO2       

## 2015-06-01 NOTE — Anesthesia Preprocedure Evaluation (Addendum)
Anesthesia Evaluation  Patient identified by MRN, date of birth, ID band Patient awake    Reviewed: Allergy & Precautions, H&P , NPO status , Patient's Chart, lab work & pertinent test results, reviewed documented beta blocker date and time   Airway Mallampati: I  TM Distance: >3 FB Neck ROM: full    Dental  (+) Edentulous Upper, Edentulous Lower   Pulmonary sleep apnea , former smoker,    Pulmonary exam normal breath sounds clear to auscultation       Cardiovascular Exercise Tolerance: Good negative cardio ROS Normal cardiovascular exam Rhythm:regular Rate:Normal     Neuro/Psych PSYCHIATRIC DISORDERS 3 strokes, last in 2012 with residual R sided weakness CVA, Residual Symptoms negative psych ROS   GI/Hepatic negative GI ROS, Neg liver ROS,   Endo/Other  negative endocrine ROS  Renal/GU negative Renal ROS  negative genitourinary   Musculoskeletal   Abdominal   Peds  Hematology negative hematology ROS (+)   Anesthesia Other Findings Pt has had shoulder surgery and complains of L shoulder pain with difficulty lying on his L side.  Reproductive/Obstetrics negative OB ROS                            Anesthesia Physical Anesthesia Plan  ASA: III  Anesthesia Plan: MAC   Post-op Pain Management:    Induction: Intravenous  Airway Management Planned: Nasal Cannula  Additional Equipment:   Intra-op Plan:   Post-operative Plan:   Informed Consent: I have reviewed the patients History and Physical, chart, labs and discussed the procedure including the risks, benefits and alternatives for the proposed anesthesia with the patient or authorized representative who has indicated his/her understanding and acceptance.   Dental Advisory Given  Plan Discussed with: CRNA  Anesthesia Plan Comments:        Anesthesia Quick Evaluation

## 2015-06-02 ENCOUNTER — Encounter: Payer: Self-pay | Admitting: Gastroenterology

## 2015-06-04 ENCOUNTER — Encounter: Payer: Self-pay | Admitting: Gastroenterology

## 2015-06-12 ENCOUNTER — Ambulatory Visit (INDEPENDENT_AMBULATORY_CARE_PROVIDER_SITE_OTHER): Payer: Managed Care, Other (non HMO) | Admitting: Surgery

## 2015-06-12 ENCOUNTER — Encounter: Payer: Self-pay | Admitting: Surgery

## 2015-06-12 VITALS — BP 129/88 | HR 55 | Temp 97.7°F | Ht 72.0 in | Wt 209.4 lb

## 2015-06-12 DIAGNOSIS — K6289 Other specified diseases of anus and rectum: Secondary | ICD-10-CM | POA: Diagnosis not present

## 2015-06-12 NOTE — Patient Instructions (Signed)
You may continue the use of the Preparation H over the counter daily as you have been. You may increase to twice daily if needed.  You may want to change the timing that you are doing your suppositories at lunch time at work to help alleviate the pain during this time period.

## 2015-06-12 NOTE — Progress Notes (Signed)
Outpatient Surgical Follow Up  06/12/2015  Jeffrey Stokes is an 54 y.o. male.   CC: Rectal pain  HPI: Patient states that the cortisone suppository did not help the rectal pain but he has been using Preparation H every other day and his pain is down to a 1 or 2 overall he feels much better. He states that he does not have pain with bowel movement to suggest that this is a fissure but he does have pain at the end of this shift after being up on his feet all day. The Preparation H over-the-counter has helped considerably. He denies melena or hematochezia. He had a colonoscopy that was negative with the exception of some internal hemorrhoids and small polyps  Past Medical History  Diagnosis Date  . Allergy   . Stroke [facility])     3 strokes last one on 2012/ mouth tilt/ weakness right hand side  . Wears dentures   . HOH (hard of hearing)     bilateral  . History of motion sickness     laying on back  . Sleep apnea     had one sleep study, inconclusive,needs repeat    Past Surgical History  Procedure Laterality Date  . Shoulder arthroscopy Left 03/2014    bicep and nerve problems still  . Tonsillectomy    . Colonoscopy with propofol N/A 06/01/2015    Procedure: COLONOSCOPY WITH PROPOFOL;  Surgeon: [name], MD;  Location: [place];  Service: Endoscopy;  Laterality: N/A;  ASCENDING COLON POLYP DESCENDING COLON POLYP TRANSVERSE COLON POLYP    Family History  Problem Relation Age of Onset  . Osteoarthritis Father     Social History:  reports that he quit smoking about 9 years ago. His smoking use included Cigarettes. He started smoking about 43 years ago. He has a 66 pack-year smoking history. He has quit using smokeless tobacco. His smokeless tobacco use included Chew. He reports that he does not drink alcohol or use illicit drugs.  Allergies:  Allergies  Allergen Reactions  . Hydrocodone Anxiety    Anxiety attacks  . Norco [Hydrocodone-Acetaminophen] Nausea Only  .  Cymbalta [Duloxetine Hcl]     Constipation   . Lyrica [Pregabalin] Other (See Comments)    "drunk feeling"    Medications reviewed.   Review of Systems:   Review of Systems  Constitutional: Negative.   HENT: Negative.   Eyes: Negative.   Respiratory: Negative.   Cardiovascular: Negative.   Gastrointestinal: Negative for heartburn, nausea, vomiting and abdominal pain.  Genitourinary: Negative.   Musculoskeletal: Negative.   Skin: Negative.   Neurological:       Left arm weakness following stroke  Psychiatric/Behavioral: Negative.      Physical Exam:  BP 129/88 mmHg  Pulse 55  Temp(Src) 97.7 F (36.5 C) (Oral)  Ht 6' (1.829 m)  Wt 209 lb 6.4 oz (94.983 kg)  BMI 28.39 kg/m2  Physical Exam  Constitutional: He is well-developed, well-nourished, and in no distress.  HENT:  Head: Normocephalic and atraumatic.  Eyes: Pupils are equal, round, and reactive to light. No scleral icterus.  Abdominal: Soft. He exhibits no distension. There is no tenderness. There is no rebound.  Genitourinary:  Rectal exam not performed today as patient had one last visit as well as a recent colonoscopy.  Neurological:  Left arm weakness      No results found for this or any previous visit (from the past 48 hour(s)). No results found.  Assessment/Plan:  Pain much  improved with a negative workup and no history to suggest that this is a fissure. I commend continuing the Preparation H as needed and follow up on an as-needed basis  Florene Glen, MD, FACS

## 2015-07-29 ENCOUNTER — Ambulatory Visit (INDEPENDENT_AMBULATORY_CARE_PROVIDER_SITE_OTHER): Payer: Managed Care, Other (non HMO) | Admitting: Internal Medicine

## 2015-07-29 ENCOUNTER — Encounter: Payer: Self-pay | Admitting: Internal Medicine

## 2015-07-29 VITALS — BP 118/78 | HR 78 | Temp 97.5°F | Resp 16 | Ht 72.0 in | Wt 208.8 lb

## 2015-07-29 DIAGNOSIS — I639 Cerebral infarction, unspecified: Secondary | ICD-10-CM | POA: Diagnosis not present

## 2015-07-29 DIAGNOSIS — K219 Gastro-esophageal reflux disease without esophagitis: Secondary | ICD-10-CM

## 2015-07-29 DIAGNOSIS — K429 Umbilical hernia without obstruction or gangrene: Secondary | ICD-10-CM

## 2015-07-29 DIAGNOSIS — J309 Allergic rhinitis, unspecified: Secondary | ICD-10-CM

## 2015-07-29 MED ORDER — CETIRIZINE HCL 10 MG PO TBDP: 1.0000 | ORAL_TABLET | Freq: Every day | ORAL | Status: DC

## 2015-07-29 MED ORDER — RANITIDINE HCL 300 MG PO TABS: 300.0000 mg | ORAL_TABLET | Freq: Every day | ORAL | Status: DC

## 2015-07-29 NOTE — Progress Notes (Signed)
Date:  07/29/2015   Name:  Jeffrey Stokes   DOB:  06/26/1961   MRN:  [id number]   Chief Complaint: Depression Depression        This is a recurrent problem.  The problem has been waxing and waning since onset.  Associated symptoms include irritable and decreased interest.     The symptoms are aggravated by work stress and social issues. Gastroesophageal Reflux He complains of abdominal pain and heartburn. He reports no chest pain, no coughing or no wheezing. This is a recurrent problem. He has tried nothing for the symptoms.  he had a normal colonscopy several months ago.  Allergies - has seen ENT.  Now taking zyrtec daily to control symptoms.  He needs a prescription. He is very concerned about upcoming nerve block to his left shoulder.  He also needs hemorrhoid surgery and umbilical hernia surgery.  He feel anxious but no especially depressed.  Review of Systems  Constitutional: Negative for fever, chills and diaphoresis.  Respiratory: Negative for cough, chest tightness, shortness of breath and wheezing.   Cardiovascular: Negative for chest pain, palpitations and leg swelling.  Gastrointestinal: Positive for heartburn, abdominal pain, anal bleeding and rectal pain.  Psychiatric/Behavioral: Positive for depression.    Patient Active Problem List   Diagnosis Date Noted  . Umbilical hernia without obstruction and without gangrene 07/29/2015  . Rhinitis, allergic 07/29/2015  . Cerebrovascular accident (CVA) ([place]) 07/29/2015  . Benign neoplasm of ascending colon   . Benign neoplasm of transverse colon   . Benign neoplasm of descending colon   . Right sided weakness 05/12/2015  . Hemorrhoid 05/12/2015  . LPRD (laryngopharyngeal reflux disease) 03/31/2015  . Depression 03/31/2015  . Panic disorder without agoraphobia 03/31/2015  . Cerebral vascular accident ([place]) 12/03/2014  . Herpes 12/03/2014  . Obstructive apnea 12/03/2014  . Disorder of rotator cuff 12/03/2014  . Bilateral  carotid artery disease ([place]) 04/15/2014  . Knee pain 03/03/2011  . Knee stiffness 03/03/2011    Prior to Admission medications   Medication Sig Start Date End Date Taking? Authorizing Provider  Ascorbic Acid (VITAMIN C PO) Take 1 tablet by mouth daily. 500 mg    Historical Provider, MD  cetirizine (ZYRTEC) 10 MG tablet Take 10 mg by mouth daily. pm    Historical Provider, MD  Cholecalciferol (VITAMIN D PO) Take 1 tablet by mouth daily. 1000 iu    Historical Provider, MD  clopidogrel (PLAVIX) 75 MG tablet Take 1 tablet (75 mg total) by mouth daily. Patient taking differently: Take 75 mg by mouth daily. am 11/13/14   Herminio Commons, MD  Cyanocobalamin (B-12 PO) Take 1 tablet by mouth daily. 1000 mcg    Historical Provider, MD  diphenhydrAMINE (BENADRYL) 50 MG capsule Take 50 mg by mouth at bedtime.     Historical Provider, MD  fluticasone (FLONASE) 50 MCG/ACT nasal spray 2 sprays as needed.  12/25/14   Historical Provider, MD  gabapentin (NEURONTIN) 300 MG capsule Take 1 capsule by mouth at bedtime. 01/22/15   Historical Provider, MD  Garlic [id number] MG CAPS Take 1,000 mg by mouth daily. am    Historical Provider, MD  Magnesium 250 MG TABS Take by mouth daily. am    Historical Provider, MD  Multiple Vitamins-Minerals (MULTIVITAMIN ADULT PO) Take by mouth daily. am    Historical Provider, MD  Omega 3 1000 MG CAPS Take 2,000 mg by mouth daily. am    Historical Provider, MD  POTASSIUM PO Take by mouth  daily. 595 mg am    Historical Provider, MD  shark liver oil-cocoa butter (PREPARATION H) 0.25-88.44 % suppository Place 1 suppository rectally as needed for hemorrhoids.    Historical Provider, MD  valACYclovir (VALTREX) 1000 MG tablet TAKE 1 TABLET DAILY Patient taking differently: TAKE 1 TABLET DAILY am 05/05/15   Glean Hess, MD    Allergies  Allergen Reactions  . Hydrocodone Anxiety    Anxiety attacks  . Norco [Hydrocodone-Acetaminophen] Nausea Only  . Cymbalta [Duloxetine Hcl]      Constipation   . Lyrica [Pregabalin] Other (See Comments)    "drunk feeling"    Past Surgical History  Procedure Laterality Date  . Shoulder arthroscopy Left 03/2014    bicep and nerve problems still  . Tonsillectomy    . Colonoscopy with propofol N/A 06/01/2015    Procedure: COLONOSCOPY WITH PROPOFOL;  Surgeon: [name], MD;  Location: [place];  Service: Endoscopy;  Laterality: N/A;  ASCENDING COLON POLYP DESCENDING COLON POLYP TRANSVERSE COLON POLYP    Social History  Substance Use Topics  . Smoking status: Former Smoker -- 2.00 packs/day for 33 years    Types: Cigarettes    Start date: 06/28/1971    Quit date: 12/25/2005  . Smokeless tobacco: Former Systems developer    Types: Chew  . Alcohol Use: No     Comment: Quit a long time ago     Medication list has been reviewed and updated.   Physical Exam  Constitutional: He is oriented to person, place, and time. He appears well-developed. He is irritable. No distress.  HENT:  Head: Normocephalic and atraumatic.  Cardiovascular: Normal rate, regular rhythm and normal heart sounds.   Pulmonary/Chest: Effort normal and breath sounds normal. No respiratory distress.  Abdominal:    Musculoskeletal: Normal range of motion.  Neurological: He is alert and oriented to person, place, and time.  Skin: Skin is warm and dry. No rash noted.  Psychiatric: His behavior is normal. Thought content normal. His mood appears anxious.    BP 118/78 mmHg  Pulse 78  Temp(Src) 97.5 F (36.4 C)  Resp 16  Ht 6' (1.829 m)  Wt 208 lb 12.8 oz (94.711 kg)  BMI 28.31 kg/m2  SpO2 98%  Assessment and Plan: 1. Allergic rhinitis, unspecified allergic rhinitis type - Cetirizine HCl 10 MG TBDP; Take 1 tablet by mouth daily.  Dispense: 30 tablet; Refill: 5  2. Gastroesophageal reflux disease, esophagitis presence not specified Begin H2 blocker daily Could use Protonix only if needed due to plavix therapy - ranitidine (ZANTAC) 300 MG tablet;  Take 1 tablet (300 mg total) by mouth at bedtime.  Dispense: 30 tablet; Refill: 5  3. Umbilical hernia without obstruction and without gangrene Followed by Gen Surgery - Dr. Burt Knack  4. Cerebrovascular accident (CVA), unspecified mechanism ([place]) Stable on plavix   Halina Maidens, MD [facility]  07/29/2015

## 2015-08-14 ENCOUNTER — Ambulatory Visit (INDEPENDENT_AMBULATORY_CARE_PROVIDER_SITE_OTHER): Payer: Managed Care, Other (non HMO) | Admitting: General Surgery

## 2015-08-14 ENCOUNTER — Encounter: Payer: Self-pay | Admitting: General Surgery

## 2015-08-14 ENCOUNTER — Encounter (INDEPENDENT_AMBULATORY_CARE_PROVIDER_SITE_OTHER): Payer: Self-pay

## 2015-08-14 VITALS — BP 123/83 | HR 73 | Temp 97.9°F | Ht 72.0 in | Wt 205.0 lb

## 2015-08-14 DIAGNOSIS — K429 Umbilical hernia without obstruction or gangrene: Secondary | ICD-10-CM

## 2015-08-14 MED ORDER — HYDROCODONE-ACETAMINOPHEN 5-325 MG PO TABS: 1.0000 | ORAL_TABLET | Freq: Four times a day (QID) | ORAL | Status: DC | PRN

## 2015-08-14 NOTE — Progress Notes (Signed)
Patient ID: Jeffrey Stokes, male   DOB: 1960-10-17, 55 y.o.   MRN: [id number]  CC: UMBILICAL HERNIA  HPI Jeffrey Stokes is a 55 y.o. male  Who comes to clinic for evaluation of an umbilical hernia. Patient states the hernias been there for at least the last year and a half however last several months become exquisitely tender. He states that with any palpation of the area or if he rubs againstanything he jumped back pain. There has always remained soft, has never changed colors, he's never had a nausea and vomiting or changes in bowel habits. Patient denies any fevers, chills, chest pain, shortness of breath. He does have a significant history of a left shoulder injury for which she is currently in a sling as well as a history of prior strokes for which she is on Plavix. Patient states that after his last colonoscopy which was done a few months ago that they removed multiple polyps. He is interested in getting his umbilical hernia repaired however states that he will take a "first come first her "whichever surgeon can get him to the hospital sooner rather be for his umbilical hernia or for his left shoulder.  HPI  Past Medical History  Diagnosis Date  . Allergy   . Stroke [facility])     3 strokes last one on 2012/ mouth tilt/ weakness right hand side  . Wears dentures   . HOH (hard of hearing)     bilateral  . History of motion sickness     laying on back  . Sleep apnea     had one sleep study, inconclusive,needs repeat    Past Surgical History  Procedure Laterality Date  . Shoulder arthroscopy Left 03/2014    bicep and nerve problems still  . Tonsillectomy    . Colonoscopy with propofol N/A 06/01/2015    Procedure: COLONOSCOPY WITH PROPOFOL;  Surgeon: [name], MD;  Location: [place];  Service: Endoscopy;  Laterality: N/A;  ASCENDING COLON POLYP DESCENDING COLON POLYP TRANSVERSE COLON POLYP    Family History  Problem Relation Age of Onset  . Osteoarthritis Father      Social History Social History  Substance Use Topics  . Smoking status: Former Smoker -- 2.00 packs/day for 33 years    Types: Cigarettes    Start date: 06/28/1971    Quit date: 12/25/2005  . Smokeless tobacco: Former Systems developer    Types: Chew  . Alcohol Use: No     Comment: Quit a long time ago    Allergies  Allergen Reactions  . Hydrocodone Anxiety    Anxiety attacks  . Norco [Hydrocodone-Acetaminophen] Nausea Only  . Cymbalta [Duloxetine Hcl]     Constipation   . Lyrica [Pregabalin] Other (See Comments)    "drunk feeling"    Current Outpatient Prescriptions  Medication Sig Dispense Refill  . Ascorbic Acid (VITAMIN C PO) Take 1 tablet by mouth daily. 500 mg    . cetirizine (ZYRTEC) 10 MG tablet Take 10 mg by mouth daily. pm    . Cetirizine HCl 10 MG TBDP Take 1 tablet by mouth daily. 30 tablet 5  . Cholecalciferol (VITAMIN D PO) Take 1 tablet by mouth daily. 1000 iu    . clopidogrel (PLAVIX) 75 MG tablet Take 1 tablet (75 mg total) by mouth daily. (Patient taking differently: Take 75 mg by mouth daily. am) 90 tablet 3  . Cyanocobalamin (B-12 PO) Take 1 tablet by mouth daily. 1000 mcg    . diphenhydrAMINE (  BENADRYL) 50 MG capsule Take 50 mg by mouth at bedtime.     . fluticasone (FLONASE) 50 MCG/ACT nasal spray 2 sprays as needed.     . gabapentin (NEURONTIN) 300 MG capsule Take 1 capsule by mouth at bedtime.    . Garlic [id number] MG CAPS Take 1,000 mg by mouth daily. am    . Magnesium 250 MG TABS Take by mouth daily. am    . Multiple Vitamins-Minerals (MULTIVITAMIN ADULT PO) Take by mouth daily. am    . Omega 3 1000 MG CAPS Take 2,000 mg by mouth daily. am    . POTASSIUM PO Take by mouth daily. 595 mg am    . ranitidine (ZANTAC) 300 MG tablet Take 1 tablet (300 mg total) by mouth at bedtime. 30 tablet 5  . shark liver oil-cocoa butter (PREPARATION H) 0.25-88.44 % suppository Place 1 suppository rectally as needed for hemorrhoids.    . valACYclovir (VALTREX) 1000 MG tablet TAKE  1 TABLET DAILY (Patient taking differently: TAKE 1 TABLET DAILY am) 90 tablet 1   No current facility-administered medications for this visit.     Review of Systems A  Multi-point review of systems was asked and was negative except for the findings documented in the history of present illness  Physical Exam Blood pressure 123/83, pulse 73, temperature 97.9 F (36.6 C), temperature source Oral, height 6' (1.829 m), weight 92.987 kg (205 lb). CONSTITUTIONAL:  No acute distress. EYES: Pupils are equal, round, and reactive to light, Sclera are non-icteric. EARS, NOSE, MOUTH AND THROAT: The oropharynx is clear. The oral mucosa is pink and moist. Hearing is intact to voice. LYMPH NODES:  Lymph nodes in the neck are normal. RESPIRATORY:  Lungs are clear. There is normal respiratory effort, with equal breath sounds bilaterally, and without pathologic use of accessory muscles. CARDIOVASCULAR: Heart is regular without murmurs, gallops, or rubs. GI: The abdomen is soft, nontender, and nondistended.  Obvious umbilical hernia that is easily reducible but exquisitely tender. There is no hepatosplenomegaly. There are normal bowel sounds in all quadrants. GU: Rectal deferred.   MUSCULOSKELETAL:  Left upper extremity and a sling that is tender to palpation at the shoulder. No cyanosis or edema.   SKIN: Turgor is good and there are no pathologic skin lesions or ulcers. NEUROLOGIC: Motor and sensation is grossly normal. Cranial nerves are grossly intact. PSYCH:  Oriented to person, place and time. Affect is normal.  Data Reviewed  there is no imaging or labs to review for this assessment I have personally reviewed the patient's imaging, laboratory findings and medical records.    Assessment     umbilical hernia     Plan     55 year old male with a significant past history of stroke and anticoagulant use as well as shoulder injury now with a symptomatic umbilical hernia. Discussed the procedure of an  open umbilical hernia repair in detail to include the risks, benefits, alternatives. Patient voices understanding and desires to proceed at the earliest possible time. Discussed possible surgery date on fiber 28th with my partner Dr. Dahlia Byes and patient is agreeable for this. Plan for an open umbilical hernia repair on 08/25/2015.      Time spent with the patient was 45 minutes, with more than 50% of the time spent in face-to-face education, counseling and care coordination.     Clayburn Pert, MD FACS General Surgeon 08/14/2015, 3:45 PM

## 2015-08-14 NOTE — Patient Instructions (Signed)
Two weeks before surgery, you'll need to be in light duty which you are already.  After your surgery you will be not be able to lift more than 15 lbs for a total of six weeks.  Stop taking Plavix two weeks before your surgery.

## 2015-08-17 ENCOUNTER — Telehealth: Payer: Self-pay | Admitting: Surgery

## 2015-08-17 NOTE — Telephone Encounter (Signed)
Pt's wife was advised of pre op date/time and sx date. Sx: 08/25/15 with Dr Pabon--Open umbilical hernia repair. Pre op: 08/20/15 @ 7:30am--Office.   Patients wife was made aware of the physician estimate and stated that patient will make payment prior to surgery.  10% co-insurance 150.00 deductible Total--$215.57

## 2015-08-20 ENCOUNTER — Ambulatory Visit
Admission: RE | Admit: 2015-08-20 | Discharge: 2015-08-20 | Disposition: A | Discharge status: Home / Self Care | Payer: Managed Care, Other (non HMO) | Source: Ambulatory Visit | Attending: General Surgery | Admitting: General Surgery

## 2015-08-20 ENCOUNTER — Telehealth: Payer: Self-pay

## 2015-08-20 ENCOUNTER — Encounter
Admission: RE | Admit: 2015-08-20 | Discharge: 2015-08-20 | Disposition: A | Discharge status: Home / Self Care | Payer: Managed Care, Other (non HMO) | Source: Ambulatory Visit | Attending: Surgery | Admitting: Surgery

## 2015-08-20 DIAGNOSIS — Z01810 Encounter for preprocedural cardiovascular examination: Secondary | ICD-10-CM | POA: Insufficient documentation

## 2015-08-20 DIAGNOSIS — Z87891 Personal history of nicotine dependence: Secondary | ICD-10-CM | POA: Diagnosis not present

## 2015-08-20 HISTORY — DX: Gastro-esophageal reflux disease without esophagitis: K21.9

## 2015-08-20 HISTORY — DX: Depression, unspecified: F32.A

## 2015-08-20 HISTORY — DX: Anxiety disorder, unspecified: F41.9

## 2015-08-20 HISTORY — DX: Personal history of transient ischemic attack (TIA), and cerebral infarction without residual deficits: Z86.73

## 2015-08-20 HISTORY — DX: Major depressive disorder, single episode, unspecified: F32.9

## 2015-08-20 LAB — CBC WITH DIFFERENTIAL/PLATELET
BASOS PCT: 0 %
Basophils Absolute: 0 10*3/uL (ref 0–0.1)
EOS ABS: 0.2 10*3/uL (ref 0–0.7)
Eosinophils Relative: 2 %
HEMATOCRIT: 43.2 % (ref 40.0–52.0)
HEMOGLOBIN: 15.4 g/dL (ref 13.0–18.0)
LYMPHS ABS: 3 10*3/uL (ref 1.0–3.6)
Lymphocytes Relative: 32 %
MCH: 33.9 pg (ref 26.0–34.0)
MCHC: 35.6 g/dL (ref 32.0–36.0)
MCV: 95.2 fL (ref 80.0–100.0)
MONOS PCT: 12 %
Monocytes Absolute: 1.2 10*3/uL — ABNORMAL HIGH (ref 0.2–1.0)
NEUTROS ABS: 5 10*3/uL (ref 1.4–6.5)
NEUTROS PCT: 54 %
Platelets: 223 10*3/uL (ref 150–440)
RBC: 4.54 MIL/uL (ref 4.40–5.90)
RDW: 12.6 % (ref 11.5–14.5)
WBC: 9.4 10*3/uL (ref 3.8–10.6)

## 2015-08-20 LAB — COMPREHENSIVE METABOLIC PANEL
ALBUMIN: 4.3 g/dL (ref 3.5–5.0)
ALK PHOS: 71 U/L (ref 38–126)
ALT: 36 U/L (ref 17–63)
ANION GAP: 5 (ref 5–15)
AST: 28 U/L (ref 15–41)
BILIRUBIN TOTAL: 0.5 mg/dL (ref 0.3–1.2)
BUN: 12 mg/dL (ref 6–20)
CALCIUM: 8.8 mg/dL — AB (ref 8.9–10.3)
CO2: 25 mmol/L (ref 22–32)
CREATININE: 0.82 mg/dL (ref 0.61–1.24)
Chloride: 108 mmol/L (ref 101–111)
GFR calc Af Amer: >60 mL/min (ref >60)
GFR calc non Af Amer: >60 mL/min (ref >60)
GLUCOSE: 98 mg/dL (ref 65–99)
Potassium: 3.9 mmol/L (ref 3.5–5.1)
SODIUM: 138 mmol/L (ref 135–145)
TOTAL PROTEIN: 7.1 g/dL (ref 6.5–8.1)

## 2015-08-20 LAB — PROTIME-INR
INR: 1.06
Prothrombin Time: 14 seconds (ref 11.4–15.0)

## 2015-08-20 LAB — APTT: aPTT: 35 seconds (ref 24–36)

## 2015-08-20 MED ORDER — ACETAMINOPHEN 325 MG PO TABS: 325.0000 mg | ORAL_TABLET | ORAL | Status: DC | PRN

## 2015-08-20 MED ORDER — ACETAMINOPHEN 160 MG/5ML PO SOLN: 325.0000 mg | ORAL | Status: DC | PRN

## 2015-08-20 NOTE — Telephone Encounter (Signed)
Gayle with Surgery at [facility] called today reporting that patient is having an hernia surgery on Tuesday. She states that the Doctor- Dr. Kirstie Mirza performing surgery needs a letter from you (not a clearance) stating that this patient is a candidate for a hernia surgery. Gayle's CB# [phone] Fax# [phone]

## 2015-08-20 NOTE — Patient Instructions (Signed)
  Your procedure is scheduled on: August 25, 2015 (Tuesday) Report to Day Surgery.[facility]) Second Floor To find out your arrival time please call [phone] between 1PM - 3PM on August 24, 2015 (Monday).  Remember: Instructions that are not followed completely may result in serious medical risk, up to and including death, or upon the discretion of your surgeon and anesthesiologist your surgery may need to be rescheduled.    __x__ 1. Do not eat food or drink liquids after midnight. No gum chewing or hard candies.     __x__ 2. No Alcohol for 24 hours before or after surgery.   ____ 3. Bring all medications with you on the day of surgery if instructed.    __x__ 4. Notify your doctor if there is any change in your medical condition     (cold, fever, infections).     Do not wear jewelry, make-up, hairpins, clips or nail polish.  Do not wear lotions, powders, or perfumes. You may wear deodorant.  Do not shave 48 hours prior to surgery. Men may shave face and neck.  Do not bring valuables to the hospital.    [facility] is not responsible for any belongings or valuables.               Contacts, dentures or bridgework may not be worn into surgery.  Leave your suitcase in the car. After surgery it may be brought to your room.  For patients admitted to the hospital, discharge time is determined by your                treatment team.   Patients discharged the day of surgery will not be allowed to drive home.   Please read over the following fact sheets that you were given:   Surgical Site Infection Prevention   __x__ Take these medicines the morning of surgery with A SIP OF WATER:    1. Zantac  2.   3.   4.  5.  6.  ____ Fleet Enema (as directed)   __x__ Use CHG Soap as directed  ____ Use inhalers on the day of surgery  ____ Stop metformin 2 days prior to surgery    ____ Take 1/2 of usual insulin dose the night before surgery and none on the morning of surgery.    _x___ Stop Coumadin/Plavix/aspirin on   _x___ Stop Anti-inflammatories on (NO NSAIDS) Tylenol ok to take for pain if needed   _x___ Stop supplements until after surgery.  (Stop Vitamin C, Vitamin 0000000, Garlic, and Omega 3 ) NOW  ____ Bring C-Pap to the hospital.

## 2015-08-20 NOTE — Pre-Procedure Instructions (Signed)
Dr. Amie Critchley notified of anesthesia consult, patient history reviewed with anesthesiologist, and stated patient should be "ok for surgery"

## 2015-08-20 NOTE — Pre-Procedure Instructions (Signed)
Dr. Army Melia office notified of request for letter stating patient is ok for umbilical hernia surgery

## 2015-08-25 ENCOUNTER — Telehealth: Payer: Self-pay

## 2015-08-25 ENCOUNTER — Ambulatory Visit: Payer: Managed Care, Other (non HMO) | Admitting: Anesthesiology

## 2015-08-25 ENCOUNTER — Encounter
Admission: RE | Disposition: A | Discharge status: Home / Self Care | Payer: Self-pay | Source: Ambulatory Visit | Attending: Surgery

## 2015-08-25 ENCOUNTER — Ambulatory Visit
Admission: RE | Admit: 2015-08-25 | Discharge: 2015-08-25 | Disposition: A | Discharge status: Home / Self Care | Payer: Managed Care, Other (non HMO) | Source: Ambulatory Visit | Attending: Surgery | Admitting: Surgery

## 2015-08-25 DIAGNOSIS — I4891 Unspecified atrial fibrillation: Secondary | ICD-10-CM | POA: Insufficient documentation

## 2015-08-25 DIAGNOSIS — Z79899 Other long term (current) drug therapy: Secondary | ICD-10-CM | POA: Diagnosis not present

## 2015-08-25 DIAGNOSIS — Z86010 Personal history of colonic polyps: Secondary | ICD-10-CM | POA: Insufficient documentation

## 2015-08-25 DIAGNOSIS — Z885 Allergy status to narcotic agent status: Secondary | ICD-10-CM | POA: Insufficient documentation

## 2015-08-25 DIAGNOSIS — K429 Umbilical hernia without obstruction or gangrene: Secondary | ICD-10-CM | POA: Diagnosis present

## 2015-08-25 DIAGNOSIS — Z8673 Personal history of transient ischemic attack (TIA), and cerebral infarction without residual deficits: Secondary | ICD-10-CM | POA: Insufficient documentation

## 2015-08-25 DIAGNOSIS — G4730 Sleep apnea, unspecified: Secondary | ICD-10-CM | POA: Diagnosis not present

## 2015-08-25 DIAGNOSIS — F418 Other specified anxiety disorders: Secondary | ICD-10-CM | POA: Insufficient documentation

## 2015-08-25 DIAGNOSIS — Z888 Allergy status to other drugs, medicaments and biological substances status: Secondary | ICD-10-CM | POA: Diagnosis not present

## 2015-08-25 DIAGNOSIS — Z7902 Long term (current) use of antithrombotics/antiplatelets: Secondary | ICD-10-CM | POA: Diagnosis not present

## 2015-08-25 DIAGNOSIS — Z87891 Personal history of nicotine dependence: Secondary | ICD-10-CM | POA: Diagnosis not present

## 2015-08-25 DIAGNOSIS — Z8261 Family history of arthritis: Secondary | ICD-10-CM | POA: Diagnosis not present

## 2015-08-25 DIAGNOSIS — K219 Gastro-esophageal reflux disease without esophagitis: Secondary | ICD-10-CM | POA: Insufficient documentation

## 2015-08-25 DIAGNOSIS — H9193 Unspecified hearing loss, bilateral: Secondary | ICD-10-CM | POA: Diagnosis not present

## 2015-08-25 HISTORY — PX: UMBILICAL HERNIA REPAIR: SHX196

## 2015-08-25 SURGERY — REPAIR, HERNIA, UMBILICAL, ADULT
Anesthesia: General | Wound class: Clean

## 2015-08-25 MED ORDER — FENTANYL CITRATE (PF) 100 MCG/2ML IJ SOLN
25.0000 ug | INTRAMUSCULAR | Status: DC | PRN
  Administered 2015-08-25 (×3): 25 ug via INTRAVENOUS

## 2015-08-25 MED ORDER — KETOROLAC TROMETHAMINE 30 MG/ML IJ SOLN
INTRAMUSCULAR | Status: DC | PRN
  Administered 2015-08-25: 30 mg via INTRAVENOUS

## 2015-08-25 MED ORDER — PROPOFOL 10 MG/ML IV BOLUS
INTRAVENOUS | Status: DC | PRN
  Administered 2015-08-25: 170 mg via INTRAVENOUS

## 2015-08-25 MED ORDER — FENTANYL CITRATE (PF) 100 MCG/2ML IJ SOLN
INTRAMUSCULAR | Status: AC
  Administered 2015-08-25: 25 ug via INTRAVENOUS
  Filled 2015-08-25: qty 2

## 2015-08-25 MED ORDER — SODIUM CHLORIDE 0.9 % IJ SOLN
INTRAMUSCULAR | Status: AC
  Filled 2015-08-25: qty 10

## 2015-08-25 MED ORDER — ROCURONIUM BROMIDE 100 MG/10ML IV SOLN
INTRAVENOUS | Status: DC | PRN
  Administered 2015-08-25: 40 mg via INTRAVENOUS

## 2015-08-25 MED ORDER — LACTATED RINGERS IV BOLUS (SEPSIS): 500.0000 mL | Freq: Once | INTRAVENOUS | Status: DC

## 2015-08-25 MED ORDER — BUPIVACAINE-EPINEPHRINE (PF) 0.5% -1:200000 IJ SOLN
INTRAMUSCULAR | Status: AC
  Filled 2015-08-25: qty 30

## 2015-08-25 MED ORDER — CHLORHEXIDINE GLUCONATE 4 % EX LIQD: 1.0000 "application " | Freq: Once | CUTANEOUS | Status: DC

## 2015-08-25 MED ORDER — MIDAZOLAM HCL 2 MG/2ML IJ SOLN
INTRAMUSCULAR | Status: DC | PRN
  Administered 2015-08-25: 2 mg via INTRAVENOUS

## 2015-08-25 MED ORDER — CEFAZOLIN SODIUM-DEXTROSE 2-3 GM-% IV SOLR
2.0000 g | INTRAVENOUS | Status: AC
  Administered 2015-08-25: 2 g via INTRAVENOUS

## 2015-08-25 MED ORDER — NEOSTIGMINE METHYLSULFATE 10 MG/10ML IV SOLN
INTRAVENOUS | Status: DC | PRN
  Administered 2015-08-25: 3 mg via INTRAVENOUS

## 2015-08-25 MED ORDER — EPHEDRINE SULFATE 50 MG/ML IJ SOLN
INTRAMUSCULAR | Status: AC
Start: 2015-08-25 — End: 2015-08-25
  Administered 2015-08-25: 5 mg via INTRAVENOUS
  Filled 2015-08-25: qty 1

## 2015-08-25 MED ORDER — DEXAMETHASONE SODIUM PHOSPHATE 10 MG/ML IJ SOLN
10.0000 mg | Freq: Once | INTRAMUSCULAR | Status: AC
  Administered 2015-08-25: 10 mg via INTRAVENOUS

## 2015-08-25 MED ORDER — METOCLOPRAMIDE HCL 5 MG/ML IJ SOLN
10.0000 mg | Freq: Once | INTRAMUSCULAR | Status: AC
  Administered 2015-08-25: 10 mg via INTRAVENOUS

## 2015-08-25 MED ORDER — LIDOCAINE HCL (CARDIAC) 20 MG/ML IV SOLN
INTRAVENOUS | Status: DC | PRN
  Administered 2015-08-25: 100 mg via INTRAVENOUS

## 2015-08-25 MED ORDER — METOCLOPRAMIDE HCL 5 MG/ML IJ SOLN
INTRAMUSCULAR | Status: AC
  Administered 2015-08-25: 10 mg via INTRAVENOUS
  Filled 2015-08-25: qty 2

## 2015-08-25 MED ORDER — OXYCODONE HCL 5 MG PO TABS: 5.0000 mg | ORAL_TABLET | Freq: Once | ORAL | Status: DC | PRN

## 2015-08-25 MED ORDER — ONDANSETRON HCL 4 MG/2ML IJ SOLN
INTRAMUSCULAR | Status: DC | PRN
  Administered 2015-08-25: 4 mg via INTRAVENOUS

## 2015-08-25 MED ORDER — EPHEDRINE SULFATE 50 MG/ML IJ SOLN
5.0000 mg | Freq: Once | INTRAMUSCULAR | Status: AC
  Administered 2015-08-25: 5 mg via INTRAVENOUS

## 2015-08-25 MED ORDER — LIDOCAINE HCL (PF) 1 % IJ SOLN
INTRAMUSCULAR | Status: AC
  Filled 2015-08-25: qty 30

## 2015-08-25 MED ORDER — DEXAMETHASONE SODIUM PHOSPHATE 10 MG/ML IJ SOLN
INTRAMUSCULAR | Status: AC
  Administered 2015-08-25: 10 mg via INTRAVENOUS
  Filled 2015-08-25: qty 1

## 2015-08-25 MED ORDER — LIDOCAINE HCL 1 % IJ SOLN
INTRAMUSCULAR | Status: DC | PRN
  Administered 2015-08-25: 20 mL via SUBCUTANEOUS

## 2015-08-25 MED ORDER — LACTATED RINGERS IV SOLN
INTRAVENOUS | Status: DC
  Administered 2015-08-25: 10:00:00 via INTRAVENOUS

## 2015-08-25 MED ORDER — OXYCODONE-ACETAMINOPHEN 7.5-325 MG PO TABS: 1.0000 | ORAL_TABLET | ORAL | Status: DC | PRN

## 2015-08-25 MED ORDER — OXYCODONE HCL 5 MG/5ML PO SOLN: 5.0000 mg | Freq: Once | ORAL | Status: DC | PRN

## 2015-08-25 MED ORDER — GLYCOPYRROLATE 0.2 MG/ML IJ SOLN
INTRAMUSCULAR | Status: DC | PRN
  Administered 2015-08-25: 0.4 mg via INTRAVENOUS

## 2015-08-25 SURGICAL SUPPLY — 22 items
APPLIER CLIP 11 MED OPEN (CLIP) ×3
BLADE CLIPPER SURG (BLADE) ×3 IMPLANT
CANISTER SUCT 1200ML W/VALVE (MISCELLANEOUS) ×3 IMPLANT
CHLORAPREP W/TINT 26ML (MISCELLANEOUS) ×3 IMPLANT
CLIP APPLIE 11 MED OPEN (CLIP) ×1 IMPLANT
DRAPE INCISE IOBAN 66X45 STRL (DRAPES) ×3 IMPLANT
DRAPE PED LAPAROTOMY (DRAPES) ×3 IMPLANT
DRSG TELFA 3X8 NADH (GAUZE/BANDAGES/DRESSINGS) ×3 IMPLANT
GLOVE BIO SURGEON STRL SZ7 (GLOVE) ×3 IMPLANT
GOWN STRL REUS W/ TWL LRG LVL3 (GOWN DISPOSABLE) ×2 IMPLANT
GOWN STRL REUS W/TWL LRG LVL3 (GOWN DISPOSABLE) ×4
MESH VENTRALEX ST 2.5 CRC MED (Mesh General) ×3 IMPLANT
NEEDLE HYPO 22GX1.5 SAFETY (NEEDLE) ×3 IMPLANT
NEEDLE HYPO 25X1 1.5 SAFETY (NEEDLE) ×3 IMPLANT
NS IRRIG 500ML POUR BTL (IV SOLUTION) ×3 IMPLANT
PACK BASIN MINOR ARMC (MISCELLANEOUS) ×3 IMPLANT
PAD GROUND ADULT SPLIT (MISCELLANEOUS) ×3 IMPLANT
SUT ETHIBOND NAB MO 7 #0 18IN (SUTURE) ×3 IMPLANT
SUT MNCRL AB 4-0 PS2 18 (SUTURE) ×3 IMPLANT
SUT VIC AB 3-0 SH 27 (SUTURE) ×2
SUT VIC AB 3-0 SH 27X BRD (SUTURE) ×1 IMPLANT
SYR 20CC LL (SYRINGE) ×3 IMPLANT

## 2015-08-25 NOTE — Transfer of Care (Signed)
Immediate Anesthesia Transfer of Care Note  Patient: Jeffrey Stokes  Procedure(s) Performed: Procedure(s): HERNIA REPAIR UMBILICAL ADULT (N/A)  Patient Location: PACU  Anesthesia Type:General  Level of Consciousness: sedated  Airway & Oxygen Therapy: Patient Spontanous Breathing and Patient connected to nasal cannula oxygen  Post-op Assessment: Report given to RN and Post -op Vital signs reviewed and stable  Post vital signs: Reviewed and stable  Last Vitals:  Filed Vitals:   08/25/15 0948  BP: 134/95  Pulse: 85  Temp: 36.7 C  Resp: 16    Complications: No apparent anesthesia complications

## 2015-08-25 NOTE — H&P (View-Only) (Signed)
Patient ID: Jeffrey Stokes, male   DOB: 1960/10/29, 55 y.o.   MRN: [id number]  CC: UMBILICAL HERNIA  HPI Jeffrey Stokes is a 55 y.o. male  Who comes to clinic for evaluation of an umbilical hernia. Patient states the hernias been there for at least the last year and a half however last several months become exquisitely tender. He states that with any palpation of the area or if he rubs againstanything he jumped back pain. There has always remained soft, has never changed colors, he's never had a nausea and vomiting or changes in bowel habits. Patient denies any fevers, chills, chest pain, shortness of breath. He does have a significant history of a left shoulder injury for which she is currently in a sling as well as a history of prior strokes for which she is on Plavix. Patient states that after his last colonoscopy which was done a few months ago that they removed multiple polyps. He is interested in getting his umbilical hernia repaired however states that he will take a "first come first her "whichever surgeon can get him to the hospital sooner rather be for his umbilical hernia or for his left shoulder.  HPI  Past Medical History  Diagnosis Date  . Allergy   . Stroke [facility])     3 strokes last one on 2012/ mouth tilt/ weakness right hand side  . Wears dentures   . HOH (hard of hearing)     bilateral  . History of motion sickness     laying on back  . Sleep apnea     had one sleep study, inconclusive,needs repeat    Past Surgical History  Procedure Laterality Date  . Shoulder arthroscopy Left 03/2014    bicep and nerve problems still  . Tonsillectomy    . Colonoscopy with propofol N/A 06/01/2015    Procedure: COLONOSCOPY WITH PROPOFOL;  Surgeon: [name], MD;  Location: [facility];  Service: Endoscopy;  Laterality: N/A;  ASCENDING COLON POLYP DESCENDING COLON POLYP TRANSVERSE COLON POLYP    Family History  Problem Relation Age of Onset  . Osteoarthritis Father      Social History Social History  Substance Use Topics  . Smoking status: Former Smoker -- 2.00 packs/day for 33 years    Types: Cigarettes    Start date: 06/28/1971    Quit date: 12/25/2005  . Smokeless tobacco: Former Systems developer    Types: Chew  . Alcohol Use: No     Comment: Quit a long time ago    Allergies  Allergen Reactions  . Hydrocodone Anxiety    Anxiety attacks  . Norco [Hydrocodone-Acetaminophen] Nausea Only  . Cymbalta [Duloxetine Hcl]     Constipation   . Lyrica [Pregabalin] Other (See Comments)    "drunk feeling"    Current Outpatient Prescriptions  Medication Sig Dispense Refill  . Ascorbic Acid (VITAMIN C PO) Take 1 tablet by mouth daily. 500 mg    . cetirizine (ZYRTEC) 10 MG tablet Take 10 mg by mouth daily. pm    . Cetirizine HCl 10 MG TBDP Take 1 tablet by mouth daily. 30 tablet 5  . Cholecalciferol (VITAMIN D PO) Take 1 tablet by mouth daily. 1000 iu    . clopidogrel (PLAVIX) 75 MG tablet Take 1 tablet (75 mg total) by mouth daily. (Patient taking differently: Take 75 mg by mouth daily. am) 90 tablet 3  . Cyanocobalamin (B-12 PO) Take 1 tablet by mouth daily. 1000 mcg    . diphenhydrAMINE (  BENADRYL) 50 MG capsule Take 50 mg by mouth at bedtime.     . fluticasone (FLONASE) 50 MCG/ACT nasal spray 2 sprays as needed.     . gabapentin (NEURONTIN) 300 MG capsule Take 1 capsule by mouth at bedtime.    . Garlic 123XX123 MG CAPS Take 1,000 mg by mouth daily. am    . Magnesium 250 MG TABS Take by mouth daily. am    . Multiple Vitamins-Minerals (MULTIVITAMIN ADULT PO) Take by mouth daily. am    . Omega 3 1000 MG CAPS Take 2,000 mg by mouth daily. am    . POTASSIUM PO Take by mouth daily. 595 mg am    . ranitidine (ZANTAC) 300 MG tablet Take 1 tablet (300 mg total) by mouth at bedtime. 30 tablet 5  . shark liver oil-cocoa butter (PREPARATION H) 0.25-88.44 % suppository Place 1 suppository rectally as needed for hemorrhoids.    . valACYclovir (VALTREX) 1000 MG tablet TAKE  1 TABLET DAILY (Patient taking differently: TAKE 1 TABLET DAILY am) 90 tablet 1   No current facility-administered medications for this visit.     Review of Systems A  Multi-point review of systems was asked and was negative except for the findings documented in the history of present illness  Physical Exam Blood pressure 123/83, pulse 73, temperature 97.9 F (36.6 C), temperature source Oral, height 6' (1.829 m), weight 92.987 kg (205 lb). CONSTITUTIONAL:  No acute distress. EYES: Pupils are equal, round, and reactive to light, Sclera are non-icteric. EARS, NOSE, MOUTH AND THROAT: The oropharynx is clear. The oral mucosa is pink and moist. Hearing is intact to voice. LYMPH NODES:  Lymph nodes in the neck are normal. RESPIRATORY:  Lungs are clear. There is normal respiratory effort, with equal breath sounds bilaterally, and without pathologic use of accessory muscles. CARDIOVASCULAR: Heart is regular without murmurs, gallops, or rubs. GI: The abdomen is soft, nontender, and nondistended.  Obvious umbilical hernia that is easily reducible but exquisitely tender. There is no hepatosplenomegaly. There are normal bowel sounds in all quadrants. GU: Rectal deferred.   MUSCULOSKELETAL:  Left upper extremity and a sling that is tender to palpation at the shoulder. No cyanosis or edema.   SKIN: Turgor is good and there are no pathologic skin lesions or ulcers. NEUROLOGIC: Motor and sensation is grossly normal. Cranial nerves are grossly intact. PSYCH:  Oriented to person, place and time. Affect is normal.  Data Reviewed  there is no imaging or labs to review for this assessment I have personally reviewed the patient's imaging, laboratory findings and medical records.    Assessment     umbilical hernia     Plan     55 year old male with a significant past history of stroke and anticoagulant use as well as shoulder injury now with a symptomatic umbilical hernia. Discussed the procedure of an  open umbilical hernia repair in detail to include the risks, benefits, alternatives. Patient voices understanding and desires to proceed at the earliest possible time. Discussed possible surgery date on fiber 28th with my partner Dr. Dahlia Byes and patient is agreeable for this. Plan for an open umbilical hernia repair on 08/25/2015.      Time spent with the patient was 45 minutes, with more than 50% of the time spent in face-to-face education, counseling and care coordination.     Clayburn Pert, MD FACS General Surgeon 08/14/2015, 3:45 PM

## 2015-08-25 NOTE — OR Nursing (Signed)
Anesthesia and OR crew made aware of patient's limitations of left arm and shoulder.

## 2015-08-25 NOTE — Anesthesia Procedure Notes (Signed)
Procedure Name: Intubation Date/Time: 08/25/2015 11:58 AM Performed by: Rosaria Ferries, Lashonne Shull Pre-anesthesia Checklist: Patient identified, Emergency Drugs available, Suction available and Patient being monitored Patient Re-evaluated:Patient Re-evaluated prior to inductionOxygen Delivery Method: Circle system utilized Preoxygenation: Pre-oxygenation with 100% oxygen Intubation Type: IV induction Laryngoscope Size: Mac and 3 Grade View: Grade I Tube type: Oral Tube size: 7.0 mm Number of attempts: 1 Placement Confirmation: ETT inserted through vocal cords under direct vision,  positive ETCO2 and breath sounds checked- equal and bilateral Secured at: 21 cm Tube secured with: Tape Dental Injury: Teeth and Oropharynx as per pre-operative assessment

## 2015-08-25 NOTE — Interval H&P Note (Signed)
History and Physical Interval Note:  08/25/2015 11:12 AM  Jeffrey Stokes  has presented today for surgery, with the diagnosis of umbilical hernia  The various methods of treatment have been discussed with the patient and family. After consideration of risks, benefits and other options for treatment, the patient has consented to  Procedure(s): HERNIA REPAIR UMBILICAL ADULT (N/A) as a surgical intervention .  The patient's history has been reviewed, patient examined, no change in status, stable for surgery.  I have reviewed the patient's chart and labs.  Questions were answered to the patient's satisfaction.     [place]

## 2015-08-25 NOTE — Anesthesia Preprocedure Evaluation (Addendum)
Anesthesia Evaluation  Patient identified by MRN, date of birth, ID band Patient awake    Reviewed: Allergy & Precautions, H&P , NPO status , Patient's Chart, lab work & pertinent test results  History of Anesthesia Complications Negative for: history of anesthetic complications  Airway Mallampati: III  TM Distance: >3 FB Neck ROM: limited    Dental  (+) Poor Dentition, Missing, Edentulous Upper, Edentulous Lower   Pulmonary neg pulmonary ROS, sleep apnea , former smoker,    Pulmonary exam normal breath sounds clear to auscultation       Cardiovascular Exercise Tolerance: Good (-) angina+ Peripheral Vascular Disease  (-) Past MI and (-) DOE Normal cardiovascular exam+ dysrhythmias Atrial Fibrillation  Rhythm:regular Rate:Normal     Neuro/Psych PSYCHIATRIC DISORDERS Anxiety Depression CVA, Residual Symptoms    GI/Hepatic Neg liver ROS, GERD  Controlled and Medicated,  Endo/Other  negative endocrine ROS  Renal/GU negative Renal ROS  negative genitourinary   Musculoskeletal   Abdominal   Peds  Hematology negative hematology ROS (+)   Anesthesia Other Findings Past Medical History:   Allergy                                                      Wears dentures                                               HOH (hard of hearing)                                          Comment:bilateral   History of motion sickness                                     Comment:laying on back   Sleep apnea                                                    Comment:had one sleep study, inconclusive,needs repeat   Stroke [facility])                                                   Comment:3 strokes last one on 2012/ mouth tilt/               weakness right hand side   Hx-TIA (transient ischemic attack)              2012         Anxiety  Depression                                                    GERD (gastroesophageal reflux disease)                      Past Surgical History:   SHOULDER ARTHROSCOPY                            Left 03/2014        Comment:bicep and nerve problems still   TONSILLECTOMY                                                 COLONOSCOPY WITH PROPOFOL                       N/A 06/01/2015      Comment:Procedure: COLONOSCOPY WITH PROPOFOL;  Surgeon:              [name], MD;  Location: [facility];  Service: Endoscopy;  Laterality: N/A;                ASCENDING COLON POLYP DESCENDING COLON               POLYP TRANSVERSE COLON POLYP   SALIVARY GLAND SURGERY                                        EYE SURGERY                                                     Comment:Age 55   BMI    Body Mass Index   27.25 kg/m 2    Patient reports no problems with oxycodone  Reproductive/Obstetrics negative OB ROS                            Anesthesia Physical Anesthesia Plan  ASA: III  Anesthesia Plan: General ETT   Post-op Pain Management:    Induction:   Airway Management Planned:   Additional Equipment:   Intra-op Plan:   Post-operative Plan:   Informed Consent: I have reviewed the patients History and Physical, chart, labs and discussed the procedure including the risks, benefits and alternatives for the proposed anesthesia with the patient or authorized representative who has indicated his/her understanding and acceptance.   Dental Advisory Given  Plan Discussed with: Anesthesiologist, CRNA and Surgeon  Anesthesia Plan Comments:        Anesthesia Quick Evaluation

## 2015-08-25 NOTE — Op Note (Signed)
Abdominal Hernia Repair  Pre-operative Diagnosis: Symptomatic umbilical hernia  Post-operative Diagnosis: Same  Surgeon: Caroleen Hamman, MD FACS  Anesthesia: Gen. with endotracheal tube  Procedure: Umbilical hernia repair with ventraleX Medium  patch  Assistant:    Findings:  2.5 cm UH  Estimated Blood Loss: 10 cc         Drains: None         Specimens: Hernia sac       Complications: none              Procedure Details  The patient was seen again in the Holding Room. The benefits, complications, treatment options, and expected outcomes were discussed with the patient. The risks of bleeding, infection, recurrence of symptoms, failure to resolve symptoms, bowel injury, mesh placement, mesh infection, any of which could require further surgery were reviewed with the patient. The likelihood of improving the patient's symptoms with return to their baseline status is good.  The patient and/or family concurred with the proposed plan, giving informed consent.  The patient was taken to Operating Room, identified as Jeffrey Stokes and the procedure verified.  A Time Out was held and the above information confirmed.  Prior to the induction of general anesthesia, antibiotic prophylaxis was administered. VTE prophylaxis was in place. General endotracheal anesthesia was then administered and tolerated well. After the induction, the abdomen was prepped with Chloraprep and draped in the sterile fashion. The patient was positioned in the supine position.  Electrocautery was used to dissect through the cutaneous tissue the hernia sac was opened and excised. The defect measured 2-1/2 cm. A medium ventrale X patch by Bard was selected. This was anchored with multiple Ethibond interrupted sutures in the standard fashion. 3-0 Vicryl was used to close the subcutaneoustissue and 4-0 Monocryl used to close the skin. Dermabond was placed on the incision.  Laparotomy counts were correct and there were no  complications    Caroleen Hamman, MD, FACS

## 2015-08-25 NOTE — Telephone Encounter (Signed)
Disability Form was filled out and faxed.

## 2015-08-25 NOTE — Anesthesia Postprocedure Evaluation (Signed)
Anesthesia Post Note  Patient: Jeffrey Stokes  Procedure(s) Performed: Procedure(s) (LRB): HERNIA REPAIR UMBILICAL ADULT (N/A)  Patient location during evaluation: PACU Anesthesia Type: General Level of consciousness: awake and alert Pain management: pain level controlled Vital Signs Assessment: post-procedure vital signs reviewed and stable Respiratory status: spontaneous breathing, nonlabored ventilation, respiratory function stable and patient connected to nasal cannula oxygen Cardiovascular status: blood pressure returned to baseline and stable Postop Assessment: no signs of nausea or vomiting Anesthetic complications: no    Last Vitals:  Filed Vitals:   08/25/15 1606 08/25/15 1713  BP: 114/66 113/64  Pulse: 70 89  Temp: 35.6 C   Resp: 16 16    Last Pain:  Filed Vitals:   08/25/15 1718  PainSc: [place]

## 2015-08-25 NOTE — Discharge Instructions (Signed)

## 2015-08-26 ENCOUNTER — Encounter: Payer: Self-pay | Admitting: Surgery

## 2015-08-26 LAB — SURGICAL PATHOLOGY

## 2015-08-28 ENCOUNTER — Telehealth: Payer: Self-pay

## 2015-08-28 MED ORDER — SULFAMETHOXAZOLE-TRIMETHOPRIM 800-160 MG PO TABS: 1.0000 | ORAL_TABLET | Freq: Two times a day (BID) | ORAL | Status: DC

## 2015-08-28 NOTE — Telephone Encounter (Signed)
Patient called in and states that wound from Umbilical Hernia is red and warm to touch. No drainage. Has cold chills but temperature is only 96.88F. Image received and reviewed with Dr. Adonis Huguenin. Verified medication allergies with patient.

## 2015-08-28 NOTE — Telephone Encounter (Signed)
Dr. Adonis Huguenin has ordered Bactrim for patient's infection and patient will need to follow-up in clinic as scheduled on 09/07/15.  Bactrim sent to preferred pharmacy at this time.

## 2015-09-04 ENCOUNTER — Encounter: Payer: Managed Care, Other (non HMO) | Admitting: Surgery

## 2015-09-07 ENCOUNTER — Ambulatory Visit (INDEPENDENT_AMBULATORY_CARE_PROVIDER_SITE_OTHER): Payer: Managed Care, Other (non HMO) | Admitting: Surgery

## 2015-09-07 ENCOUNTER — Encounter: Payer: Self-pay | Admitting: Surgery

## 2015-09-07 ENCOUNTER — Telehealth: Payer: Self-pay

## 2015-09-07 VITALS — BP 120/76 | HR 61 | Temp 97.7°F | Ht 72.0 in | Wt 203.0 lb

## 2015-09-07 DIAGNOSIS — Z09 Encounter for follow-up examination after completed treatment for conditions other than malignant neoplasm: Secondary | ICD-10-CM

## 2015-09-07 MED ORDER — LIDOCAINE 5 % EX OINT: 1.0000 "application " | TOPICAL_OINTMENT | Freq: Three times a day (TID) | CUTANEOUS | Status: DC | PRN

## 2015-09-07 NOTE — Progress Notes (Signed)
S/ post open umbilical hernia repair with mesh. Overall doing well he is hypersensitive over the skin area surrounding the umbilicus. No drainage. He has prescribed Percocet and he is not taking this anymore. He is unable to wear any clothing over the incision site due to discomfort. He is tolerating regular diet he's afebrile and ambulating.  Physical exam: No acute distress awake alert Abdomen: Incision clean and dry and intact, no evidence of infection there is hyper esthesia over the skin there is no fluctuance and there is no evidence of a hematoma or recurrence  A/p oral doing well after umbilical hernia he has developed some hyperesthesia at the incision site without any and topical complications. I discussed with him about adding gabapentin but he is unable to tolerate this and I also discussed with him about the importance of ice and the addition of topical lidocaine for symptomatic relief. We will give him a 2 week work excuse and we will see him back in 2 weeks and reassess and any other needs that time

## 2015-09-07 NOTE — Telephone Encounter (Signed)
Patient's wife called in and an error has been made with Disability note printed today at office. Visit.  Patient is actually supposed to be out until next office visit with Dr. Azalee Course on 09/23/15.  Disability note has been updated and faxed to employee [phone] at this time with Attn: Santa Lighter

## 2015-09-07 NOTE — Patient Instructions (Signed)
Please use ice to this area 4-5 times daily for 15-20 minutes each time.  We have sent in Lidocaine gel to your pharmacy. Apply this three times daily when in pain.  You will follow-up in office in 2 weeks to check on your status at that time.  Please see your appointment below.

## 2015-09-09 ENCOUNTER — Encounter: Payer: Managed Care, Other (non HMO) | Admitting: Internal Medicine

## 2015-09-12 ENCOUNTER — Telehealth: Payer: Self-pay

## 2015-09-12 NOTE — Telephone Encounter (Signed)
Disability paperwork for Jeffrey Stokes was filled out and faxed with additional information they requested.

## 2015-09-23 ENCOUNTER — Encounter (INDEPENDENT_AMBULATORY_CARE_PROVIDER_SITE_OTHER): Payer: Self-pay

## 2015-09-23 ENCOUNTER — Encounter: Payer: Self-pay | Admitting: Surgery

## 2015-09-23 ENCOUNTER — Ambulatory Visit (INDEPENDENT_AMBULATORY_CARE_PROVIDER_SITE_OTHER): Payer: Managed Care, Other (non HMO) | Admitting: Surgery

## 2015-09-23 VITALS — BP 125/78 | HR 65 | Temp 96.5°F | Ht 72.0 in | Wt 201.0 lb

## 2015-09-23 DIAGNOSIS — K429 Umbilical hernia without obstruction or gangrene: Secondary | ICD-10-CM

## 2015-09-23 NOTE — Patient Instructions (Addendum)
You may return to work on light duty at your job tomorrow with restrictions until 10/06/15. See note provided.  Please call with any questions or concerns.

## 2015-09-23 NOTE — Progress Notes (Signed)
54 year old status post umbilical hernia repair. Patient states that he is doing well does still some stiffness in the air but denies any redness or drainage from the wound. Patient states that the pain is much better in the area although there is still some soreness. Patient is eating and drinking well and having bowel movements without any difficulty.  Filed Vitals:   09/23/15 1023  BP: 125/78  Pulse: 65  Temp: 96.5 F (35.8 C)   PE:  Gen: NAD  Abd: soft, non-tender, incision c/d/i    A/P: Healing well from open umbilical hernia repair he is to continue the work restrictions for 4 weeks out. Can follow-up when necessary.

## 2015-10-26 ENCOUNTER — Ambulatory Visit (INDEPENDENT_AMBULATORY_CARE_PROVIDER_SITE_OTHER): Payer: Managed Care, Other (non HMO) | Admitting: Internal Medicine

## 2015-10-26 ENCOUNTER — Encounter: Payer: Self-pay | Admitting: Internal Medicine

## 2015-10-26 VITALS — BP 100/80 | HR 72 | Temp 98.2°F | Resp 16 | Ht 72.0 in | Wt 194.0 lb

## 2015-10-26 DIAGNOSIS — I639 Cerebral infarction, unspecified: Secondary | ICD-10-CM | POA: Diagnosis not present

## 2015-10-26 DIAGNOSIS — F32A Depression, unspecified: Secondary | ICD-10-CM

## 2015-10-26 DIAGNOSIS — F329 Major depressive disorder, single episode, unspecified: Secondary | ICD-10-CM | POA: Diagnosis not present

## 2015-10-26 DIAGNOSIS — Z0000 Encounter for general adult medical examination without abnormal findings: Secondary | ICD-10-CM | POA: Diagnosis not present

## 2015-10-26 DIAGNOSIS — Z125 Encounter for screening for malignant neoplasm of prostate: Secondary | ICD-10-CM

## 2015-10-26 LAB — POCT URINALYSIS DIPSTICK
BILIRUBIN UA: NEGATIVE
Blood, UA: NEGATIVE
GLUCOSE UA: NEGATIVE
KETONES UA: NEGATIVE
Leukocytes, UA: NEGATIVE
Nitrite, UA: NEGATIVE
Protein, UA: NEGATIVE
SPEC GRAV UA: 1.025
UROBILINOGEN UA: 0.2
pH, UA: 5

## 2015-10-26 NOTE — Progress Notes (Signed)
Date:  10/26/2015   Name:  Jeffrey Stokes   DOB:  June 14, 1961   MRN:  [id number]   Chief Complaint: Annual Exam Jeffrey Stokes is a 55 y.o. male who presents today for his Complete Annual Exam. He feels fairly well. He reports exercising none. He reports he is sleeping poorly because his shoulder hurts. He underwent abdominal hernia repair several months ago and is healing well.  Depression - ongoing issues due to health and work issues. He has Cymbalta to take but is only taking intermittently.  He is not eager to take daily medication but I suggested that he try it daily.  Should pain - ongoing chronic pain since surgery.  He is seeing specialists and trying to work through the problem.  He continues to work doing as he can.  Today he is in a sling.  Review of Systems  Constitutional: Positive for unexpected weight change (eating less due to pain and stress). Negative for fever and chills.  HENT: Negative for ear pain, hearing loss and trouble swallowing.   Eyes: Negative for visual disturbance.  Respiratory: Negative for cough, chest tightness, shortness of breath and wheezing.   Cardiovascular: Negative for chest pain, palpitations and leg swelling.  Gastrointestinal: Negative for abdominal pain, constipation and blood in stool.  Endocrine: Negative for polydipsia and polyuria.  Genitourinary: Negative for dysuria, hematuria and difficulty urinating.  Musculoskeletal: Positive for myalgias and arthralgias. Negative for joint swelling and gait problem.  Neurological: Negative for tremors, weakness, numbness and headaches.  Psychiatric/Behavioral: Positive for sleep disturbance and dysphoric mood. Negative for suicidal ideas and self-injury. The patient is nervous/anxious.     Patient Active Problem List   Diagnosis Date Noted  . Rhinitis, allergic 07/29/2015  . Benign neoplasm of ascending colon   . Benign neoplasm of transverse colon   . Benign neoplasm of descending colon   .  Hemorrhoid 05/12/2015  . LPRD (laryngopharyngeal reflux disease) 03/31/2015  . Depression 03/31/2015  . Panic disorder without agoraphobia 03/31/2015  . Cerebral vascular accident ([place]) 12/03/2014  . Herpes 12/03/2014  . Obstructive apnea 12/03/2014  . Disorder of rotator cuff 12/03/2014  . Bilateral carotid artery disease ([place]) 04/15/2014  . Knee stiffness 03/03/2011    Prior to Admission medications   Medication Sig Start Date End Date Taking? Authorizing Provider  acetaminophen (TYLENOL) 325 MG tablet Take 650 mg by mouth every 6 (six) hours as needed.   Yes Historical Provider, MD  Ascorbic Acid (VITAMIN C PO) Take 1 tablet by mouth daily. Reported on 09/23/2015   Yes Historical Provider, MD  cetirizine (ZYRTEC) 10 MG tablet Take 10 mg by mouth at bedtime.    Yes Historical Provider, MD  Cholecalciferol (VITAMIN D PO) Take 1 tablet by mouth daily. Reported on 09/23/2015   Yes Historical Provider, MD  Cyanocobalamin (B-12 PO) Take 1 tablet by mouth daily. Reported on 09/23/2015   Yes Historical Provider, MD  diphenhydrAMINE (BENADRYL) 50 MG capsule Take 50 mg by mouth at bedtime. Reported on 09/23/2015   Yes Historical Provider, MD  fluticasone (FLONASE) 50 MCG/ACT nasal spray Place 2 sprays into both nostrils as needed. Reported on 09/23/2015 12/25/14  Yes Historical Provider, MD  Garlic 123XX123 MG CAPS Take 1,000 mg by mouth daily. Reported on 09/23/2015   Yes Historical Provider, MD  Magnesium 250 MG TABS Take 250 mg by mouth daily. Reported on 09/23/2015   Yes Historical Provider, MD  Multiple Vitamins-Minerals (MULTIVITAMIN ADULT PO) Take 1 tablet by  mouth daily. Reported on 09/23/2015   Yes Historical Provider, MD  Omega 3 1000 MG CAPS Take 2,000 mg by mouth daily. Reported on 09/23/2015   Yes Historical Provider, MD  POTASSIUM PO Take 595 mg by mouth daily. Reported on 09/23/2015   Yes Historical Provider, MD  ranitidine (ZANTAC) 300 MG tablet Take 1 tablet (300 mg total) by mouth at bedtime.  07/29/15  Yes Glean Hess, MD  valACYclovir (VALTREX) 1000 MG tablet TAKE 1 TABLET DAILY 05/05/15  Yes Glean Hess, MD    Allergies  Allergen Reactions  . Hydrocodone Anxiety    Anxiety attacks  . Norco [Hydrocodone-Acetaminophen] Nausea Only  . Cymbalta [Duloxetine Hcl]     Constipation   . Lyrica [Pregabalin] Other (See Comments)    "drunk feeling"    Past Surgical History  Procedure Laterality Date  . Shoulder arthroscopy Left 03/2014    bicep and nerve problems still  . Tonsillectomy    . Colonoscopy with propofol N/A 06/01/2015    Procedure: COLONOSCOPY WITH PROPOFOL;  Surgeon: [name], MD;  Location: [place];  Service: Endoscopy;  Laterality: N/A;  ASCENDING COLON POLYP DESCENDING COLON POLYP TRANSVERSE COLON POLYP  . Salivary gland surgery    . Eye surgery      Age 19   . Umbilical hernia repair N/A 08/25/2015    Procedure: HERNIA REPAIR UMBILICAL ADULT;  Surgeon: Jules Husbands, MD;  Location: [facility];  Service: General;  Laterality: N/A;    Social History  Substance Use Topics  . Smoking status: Former Smoker -- 2.00 packs/day for 33 years    Types: Cigarettes    Start date: 06/28/1971    Quit date: 12/25/2005  . Smokeless tobacco: Former Systems developer    Types: Chew  . Alcohol Use: No     Comment: Quit a long time ago     Medication list has been reviewed and updated.   Physical Exam  Constitutional: He is oriented to person, place, and time. He appears well-developed and well-nourished.  HENT:  Head: Normocephalic.  Right Ear: Tympanic membrane, external ear and ear canal normal.  Left Ear: Tympanic membrane, external ear and ear canal normal.  Nose: Nose normal.  Mouth/Throat: Uvula is midline and oropharynx is clear and moist.  Eyes: Conjunctivae and EOM are normal. Pupils are equal, round, and reactive to light.  Neck: Normal range of motion. Neck supple. Carotid bruit is not present. No thyromegaly present.  Cardiovascular: Normal  rate, regular rhythm, normal heart sounds and intact distal pulses.   Pulmonary/Chest: Effort normal and breath sounds normal. He has no wheezes. Right breast exhibits no mass. Left breast exhibits no mass.  Abdominal: Soft. Normal appearance and bowel sounds are normal. There is no hepatosplenomegaly. There is no tenderness.  Musculoskeletal:       Right shoulder: He exhibits decreased range of motion, tenderness and swelling.       Left shoulder: He exhibits decreased range of motion, tenderness and swelling.  Lymphadenopathy:    He has no cervical adenopathy.  Neurological: He is alert and oriented to person, place, and time. He has normal reflexes. He displays no atrophy and no tremor.  Skin: Skin is warm, dry and intact.  Psychiatric: He has a normal mood and affect. His speech is normal and behavior is normal. Judgment and thought content normal.  Nursing note and vitals reviewed.  Wt Readings from Last 3 Encounters:  10/26/15 194 lb (87.998 kg)  09/23/15 201 lb (91.173  kg)  09/07/15 203 lb (92.08 kg)    BP 100/80 mmHg  Pulse 72  Temp(Src) 98.2 F (36.8 C) (Oral)  Resp 16  Ht 6' (1.829 m)  Wt 194 lb (87.998 kg)  BMI 26.31 kg/m2  SpO2 98%  Assessment and Plan: 1. Annual physical exam CMP and CBC were normal in February Begin exercise with a stationary bike - TSH - POCT urinalysis dipstick  2. Prostate cancer screening DRE deferred to lack of symptoms - PSA  3. Cerebrovascular accident (CVA), unspecified mechanism ([place]) Remote event with no residual Should probably be taking daily Aspirin - Lipid panel  4. Depression Recommend Cymbalta daily - it not tolerated, consider another medication   Halina Maidens, MD [facility]  10/26/2015

## 2015-10-27 LAB — LIPID PANEL
CHOL/HDL RATIO: 3.2 ratio (ref 0.0–5.0)
CHOLESTEROL TOTAL: 173 mg/dL (ref 100–199)
HDL: 54 mg/dL (ref >39)
LDL CALC: 104 mg/dL — AB (ref 0–99)
Triglycerides: 77 mg/dL (ref 0–149)
VLDL CHOLESTEROL CAL: 15 mg/dL (ref 5–40)

## 2015-10-27 LAB — PSA: Prostate Specific Ag, Serum: 0.3 ng/mL (ref 0.0–4.0)

## 2015-10-27 LAB — TSH: TSH: 1.44 u[IU]/mL (ref 0.450–4.500)

## 2015-11-01 ENCOUNTER — Other Ambulatory Visit: Payer: Self-pay | Admitting: Internal Medicine

## 2015-11-24 ENCOUNTER — Ambulatory Visit: Payer: Managed Care, Other (non HMO) | Admitting: Surgery

## 2015-11-25 ENCOUNTER — Encounter: Payer: Self-pay | Admitting: Surgery

## 2015-11-25 ENCOUNTER — Telehealth: Payer: Self-pay | Admitting: Surgery

## 2015-11-25 ENCOUNTER — Ambulatory Visit (INDEPENDENT_AMBULATORY_CARE_PROVIDER_SITE_OTHER): Payer: Managed Care, Other (non HMO) | Admitting: Surgery

## 2015-11-25 VITALS — BP 112/77 | HR 59 | Temp 97.8°F | Wt 192.0 lb

## 2015-11-25 DIAGNOSIS — K648 Other hemorrhoids: Secondary | ICD-10-CM

## 2015-11-25 DIAGNOSIS — K643 Fourth degree hemorrhoids: Secondary | ICD-10-CM

## 2015-11-25 NOTE — Patient Instructions (Signed)
We ordered your Hemorrhoid cream to [facility],   [address], [place], [place] [address]

## 2015-11-25 NOTE — Telephone Encounter (Signed)
Called patient's wife and explained that the medication that was prescribed was an ointment that is only sold at [facility]. She stated that she understood now and that she will pick it up tomorrow. [facility]'s information was provided to the patient's wife as well.

## 2015-11-25 NOTE — Telephone Encounter (Signed)
Patients wife called back and would like the hemmoroid cream called in to [facility] in [place]

## 2015-11-25 NOTE — Progress Notes (Signed)
Surgeon hatches very well known to me and I did umbilical hernia repair 3 months ago and is doing well from this. Experience some mild intermittent abdominal pain. Now the reason for this visit is moderate to severe anorectal pain associated with hemorrhoid. He had a recent colonoscopy and 3 hemorrhoids were found. Our last few weeks has been spirits and anorectal pain, no hematochezia. Hasn't some sitz baths with no relief. No previous hemorrhoid surgery  PE: NAD Abd: soft, scar from [facility] repair, no recurrence or infection, no peritonitis Rectal : left grade IV lateral hemorrhoid and grade III posterolateral left and right hemorrhoids. Ext: well perfused , no edema  A/p Intermatic internal hemorrhoids. We'll do sitz baths, high fiber diet, topical cream RTC 3 weeks symptoms persist we'll probably do open hemorrhoidectomy. Situation was playing in detail and counseling provided. I Spent approximately 20 minutes in this encounterI

## 2015-12-17 ENCOUNTER — Ambulatory Visit (INDEPENDENT_AMBULATORY_CARE_PROVIDER_SITE_OTHER): Payer: Managed Care, Other (non HMO) | Admitting: Surgery

## 2015-12-17 ENCOUNTER — Encounter: Payer: Self-pay | Admitting: Surgery

## 2015-12-17 VITALS — BP 156/85 | HR 59 | Temp 98.3°F | Ht 72.0 in | Wt 190.0 lb

## 2015-12-17 DIAGNOSIS — K649 Unspecified hemorrhoids: Secondary | ICD-10-CM | POA: Diagnosis not present

## 2015-12-17 NOTE — Progress Notes (Signed)
F/U Internal hemorrhoids Medical rx working  Minimal pain, significant improvement  PE NAD  Abd Soft NT He wishes to ski rectal exam at this time Ext: warm, no edema  A/P continue medical rx F/U prn If sxs persist may require hemorrhoidectomy

## 2015-12-17 NOTE — Patient Instructions (Signed)
Please give us a call if you have any questions or concerns. 

## 2016-02-12 DIAGNOSIS — G5642 Causalgia of left upper limb: Secondary | ICD-10-CM | POA: Insufficient documentation

## 2016-04-20 DIAGNOSIS — G5622 Lesion of ulnar nerve, left upper limb: Secondary | ICD-10-CM | POA: Insufficient documentation

## 2016-04-22 ENCOUNTER — Other Ambulatory Visit: Payer: Self-pay | Admitting: Internal Medicine

## 2016-04-26 ENCOUNTER — Encounter: Payer: Self-pay | Admitting: Internal Medicine

## 2016-04-26 ENCOUNTER — Ambulatory Visit (INDEPENDENT_AMBULATORY_CARE_PROVIDER_SITE_OTHER): Payer: Managed Care, Other (non HMO) | Admitting: Internal Medicine

## 2016-04-26 VITALS — BP 112/78 | HR 84 | Resp 16 | Ht 72.0 in | Wt 195.0 lb

## 2016-04-26 DIAGNOSIS — J40 Bronchitis, not specified as acute or chronic: Secondary | ICD-10-CM

## 2016-04-26 DIAGNOSIS — N401 Enlarged prostate with lower urinary tract symptoms: Secondary | ICD-10-CM | POA: Diagnosis not present

## 2016-04-26 DIAGNOSIS — N138 Other obstructive and reflux uropathy: Secondary | ICD-10-CM

## 2016-04-26 MED ORDER — LEVOFLOXACIN 500 MG PO TABS: 500.0000 mg | ORAL_TABLET | Freq: Every day | ORAL | 0 refills | Status: DC

## 2016-04-26 NOTE — Patient Instructions (Signed)

## 2016-04-26 NOTE — Progress Notes (Signed)
Date:  04/26/2016   Name:  LEMARR Stokes   DOB:  [birth year]   MRN:  Jeffrey Stokes   Chief Complaint: Cough (3 weeks ) and Urinary Frequency (Getting up 3 times at night. )  Cough  This is a new problem. The current episode started 1 to 4 weeks ago. The problem has been unchanged. The problem occurs every few minutes. The cough is non-productive. Associated symptoms include chest pain and shortness of breath. Pertinent negatives include no nasal congestion, postnasal drip, rash or sore throat. He has tried OTC cough suppressant and OTC inhaler for the symptoms. The treatment provided no relief. normal PSA in May  Urinary Frequency   This is a new problem. The problem has been waxing and waning. The patient is experiencing no pain. There has been no fever. Associated symptoms include frequency and hesitancy. Pertinent negatives include no hematuria or urgency. He has tried nothing for the symptoms. normal PSA in May     Review of Systems  HENT: Negative for postnasal drip and sore throat.   Respiratory: Positive for cough and shortness of breath.   Cardiovascular: Positive for chest pain.  Gastrointestinal: Negative for abdominal pain and diarrhea.  Genitourinary: Positive for frequency and hesitancy. Negative for hematuria and urgency.  Musculoskeletal: Positive for arthralgias (on-going shoulder pain).  Skin: Negative for rash.    Patient Active Problem List   Diagnosis Date Noted  . Ulnar neuritis, left 04/20/2016  . Complex regional pain syndrome type 2 of left upper extremity 02/12/2016  . Rhinitis, allergic 07/29/2015  . Benign neoplasm of ascending colon   . Benign neoplasm of transverse colon   . Benign neoplasm of descending colon   . Hemorrhoid 05/12/2015  . LPRD (laryngopharyngeal reflux disease) 03/31/2015  . Depression 03/31/2015  . Panic disorder without agoraphobia 03/31/2015  . Cerebral vascular accident ([place]) 12/03/2014  . Herpes 12/03/2014  . Obstructive apnea  12/03/2014  . Disorder of rotator cuff 12/03/2014  . Bilateral carotid artery disease ([place]) 04/15/2014  . Knee stiffness 03/03/2011    Prior to Admission medications   Medication Sig Start Date End Date Taking? Authorizing Provider  acetaminophen (TYLENOL) 325 MG tablet Take 650 mg by mouth every 6 (six) hours as needed.   Yes Historical Provider, MD  amitriptyline (ELAVIL) 10 MG tablet Take 10 mg by mouth. 02/12/16 02/11/17 Yes Historical Provider, MD  cetirizine (ZYRTEC) 10 MG tablet Take 10 mg by mouth at bedtime.    Yes Historical Provider, MD  DULoxetine (CYMBALTA) 60 MG capsule Take 60 mg by mouth daily.   Yes Historical Provider, MD  fluticasone (FLONASE) 50 MCG/ACT nasal spray Place 2 sprays into both nostrils as needed. Reported on 09/23/2015 12/25/14  Yes Historical Provider, MD  lidocaine (LIDODERM) 5 % Apply patch to painful area. Patch may remain in place for up to 12 hours in a 24 hour period. 04/20/16 05/20/16 Yes Historical Provider, MD  ranitidine (ZANTAC) 300 MG tablet Take 1 tablet (300 mg total) by mouth at bedtime. 07/29/15  Yes Glean Hess, MD  valACYclovir (VALTREX) 1000 MG tablet TAKE 1 TABLET DAILY 04/22/16  Yes Glean Hess, MD  Ascorbic Acid (VITAMIN C PO) Take 1 tablet by mouth daily. Reported on 09/23/2015    Historical Provider, MD  Cholecalciferol (VITAMIN D PO) Take 1 tablet by mouth daily. Reported on 09/23/2015    Historical Provider, MD  Cyanocobalamin (B-12 PO) Take 1 tablet by mouth daily. Reported on 09/23/2015    Historical  Provider, MD  Garlic 123XX123 MG CAPS Take 1,000 mg by mouth daily. Reported on 09/23/2015    Historical Provider, MD  Magnesium 250 MG TABS Take 250 mg by mouth daily. Reported on 09/23/2015    Historical Provider, MD  Multiple Vitamins-Minerals (MULTIVITAMIN ADULT PO) Take 1 tablet by mouth daily. Reported on 09/23/2015    Historical Provider, MD  Omega 3 1000 MG CAPS Take 2,000 mg by mouth daily. Reported on 09/23/2015    Historical  Provider, MD  POTASSIUM PO Take 595 mg by mouth daily. Reported on 09/23/2015    Historical Provider, MD    Allergies  Allergen Reactions  . Hydrocodone Nausea Only and Anxiety  . Cymbalta [Duloxetine Hcl]     Constipation   . Lyrica [Pregabalin] Other (See Comments)    "drunk feeling"    Past Surgical History:  Procedure Laterality Date  . COLONOSCOPY WITH PROPOFOL N/A 06/01/2015   Procedure: COLONOSCOPY WITH PROPOFOL;  Surgeon: [name], MD;  Location: [place];  Service: Endoscopy;  Laterality: N/A;  ASCENDING COLON POLYP DESCENDING COLON POLYP TRANSVERSE COLON POLYP  . EYE SURGERY     Age 44   . SALIVARY GLAND SURGERY    . SHOULDER ARTHROSCOPY Left 03/2014   bicep and nerve problems still  . TONSILLECTOMY    . UMBILICAL HERNIA REPAIR N/A 08/25/2015   Procedure: HERNIA REPAIR UMBILICAL ADULT;  Surgeon: Jules Husbands, MD;  Location: [facility];  Service: General;  Laterality: N/A;    Social History  Substance Use Topics  . Smoking status: Former Smoker    Packs/day: 2.00    Years: 33.00    Types: Cigarettes    Start date: 06/28/1971    Quit date: 12/25/2005  . Smokeless tobacco: Former Systems developer    Types: Chew  . Alcohol use No     Comment: Quit a long time ago     Medication list has been reviewed and updated.   Physical Exam  Constitutional: He is oriented to person, place, and time. He appears well-developed. No distress.  HENT:  Head: Normocephalic and atraumatic.  Cardiovascular: Normal rate, regular rhythm and normal heart sounds.   Pulmonary/Chest: Effort normal. No respiratory distress. He has decreased breath sounds. He has no wheezes. He has rhonchi.  Musculoskeletal:       Left shoulder: He exhibits decreased range of motion and tenderness.  Neurological: He is alert and oriented to person, place, and time.  Skin: Skin is warm and dry. No rash noted.  Psychiatric: He has a normal mood and affect. His behavior is normal. Thought content normal.    Nursing note and vitals reviewed.   BP 112/78   Pulse 84   Resp 16   Ht 6' (1.829 m)   Wt 195 lb (88.5 kg)   SpO2 98%   BMI 26.45 kg/m   Assessment and Plan: 1. Bronchitis Continue over the counter cough suppressants - levofloxacin (LEVAQUIN) 500 MG tablet; Take 1 tablet (500 mg total) by mouth daily.  Dispense: 10 tablet; Refill: 0  2. BPH with obstruction/lower urinary tract symptoms No medications for now - consider Flomax   Halina Maidens, MD [facility]  04/26/2016

## 2016-06-19 ENCOUNTER — Other Ambulatory Visit: Payer: Self-pay | Admitting: Internal Medicine

## 2016-06-24 ENCOUNTER — Ambulatory Visit (INDEPENDENT_AMBULATORY_CARE_PROVIDER_SITE_OTHER): Payer: Managed Care, Other (non HMO) | Admitting: Internal Medicine

## 2016-06-24 ENCOUNTER — Encounter: Payer: Self-pay | Admitting: Internal Medicine

## 2016-06-24 VITALS — BP 112/73 | HR 92 | Temp 97.6°F | Ht 72.0 in | Wt 198.0 lb

## 2016-06-24 DIAGNOSIS — G629 Polyneuropathy, unspecified: Secondary | ICD-10-CM | POA: Diagnosis not present

## 2016-06-24 DIAGNOSIS — G5642 Causalgia of left upper limb: Secondary | ICD-10-CM | POA: Diagnosis not present

## 2016-06-24 DIAGNOSIS — R809 Proteinuria, unspecified: Secondary | ICD-10-CM | POA: Diagnosis not present

## 2016-06-24 LAB — POCT URINALYSIS DIPSTICK
Glucose, UA: NEGATIVE
KETONES UA: NEGATIVE
Leukocytes, UA: NEGATIVE
NITRITE UA: NEGATIVE
PH UA: 5
PROTEIN UA: NEGATIVE
RBC UA: NEGATIVE
Spec Grav, UA: 1.01
Urobilinogen, UA: 0.2

## 2016-06-24 MED ORDER — GABAPENTIN 100 MG PO CAPS: 100.0000 mg | ORAL_CAPSULE | Freq: Every day | ORAL | 2 refills | Status: DC

## 2016-06-24 NOTE — Progress Notes (Signed)
Date:  06/24/2016   Name:  Jeffrey Stokes   DOB:  06-20-1961   MRN:  [id number]   Chief Complaint: Leg Pain (Pt stated having sharp pain Lt leg) and Anxiety Leg Pain   The injury mechanism was a direct blow (plus CVA on left side). The pain is present in the left leg. The quality of the pain is described as stabbing. The pain is mild. The pain has been fluctuating since onset. The symptoms are aggravated by weight bearing. He has tried elevation for the symptoms. The treatment provided mild relief.  Anxiety  Presents for follow-up visit. Symptoms include nervous/anxious behavior. Patient reports no chest pain, shortness of breath or suicidal ideas. Symptoms occur most days.    CRS - left upper arm.  Doing better on new medication and cymbalta.  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    Review of Systems  Constitutional: Negative for chills, fatigue and fever.  Respiratory: Negative for cough, chest tightness, shortness of breath and wheezing.   Cardiovascular: Negative for chest pain.  Musculoskeletal: Positive for arthralgias and myalgias.  Psychiatric/Behavioral: Negative for suicidal ideas. The patient is nervous/anxious.     Patient Active Problem List   Diagnosis Date Noted  . Ulnar neuritis, left 04/20/2016  . Complex regional pain syndrome type 2 of left upper extremity 02/12/2016  . Rhinitis, allergic 07/29/2015  . Benign neoplasm of ascending colon   . Benign neoplasm of transverse colon   . Benign neoplasm of descending colon   . Hemorrhoid 05/12/2015  . LPRD (laryngopharyngeal reflux disease) 03/31/2015  . Depression 03/31/2015  . Panic disorder without agoraphobia 03/31/2015  . Cerebral vascular accident ([place]) 12/03/2014  . Herpes 12/03/2014  . Obstructive apnea 12/03/2014  . Disorder of rotator cuff 12/03/2014  . Bilateral carotid artery disease ([place]) 04/15/2014  . Knee  stiffness 03/03/2011    Prior to Admission medications   Medication Sig Start Date End Date Taking? Authorizing Provider  acetaminophen (TYLENOL) 325 MG tablet Take 650 mg by mouth every 6 (six) hours as needed.   Yes Historical Provider, MD  amitriptyline (ELAVIL) 10 MG tablet Take 10 mg by mouth. 02/12/16 02/11/17 Yes Historical Provider, MD  Ascorbic Acid (VITAMIN C PO) Take 1 tablet by mouth daily. Reported on 09/23/2015   Yes Historical Provider, MD  cetirizine (ZYRTEC) 10 MG tablet Take 10 mg by mouth at bedtime.    Yes Historical Provider, MD  Cholecalciferol (VITAMIN D PO) Take 1 tablet by mouth daily. Reported on 09/23/2015   Yes Historical Provider, MD  Cyanocobalamin (B-12 PO) Take 1 tablet by mouth daily. Reported on 09/23/2015   Yes Historical Provider, MD  DULoxetine (CYMBALTA) 60 MG capsule Take 60 mg by mouth daily.   Yes Historical Provider, MD  fluticasone (FLONASE) 50 MCG/ACT nasal spray Place 2 sprays into both nostrils as needed. Reported on 09/23/2015 12/25/14  Yes Historical Provider, MD  Garlic [id number] MG CAPS Take 1,000 mg by mouth daily. Reported on 09/23/2015   Yes Historical Provider, MD  lidocaine (LIDODERM) 5 % Apply patch to painful area. Patch may remain in place for up to 12 hours in a 24 hour period. 04/20/16  Yes Historical Provider, MD  Magnesium 250 MG TABS Take 250 mg by mouth daily. Reported on 09/23/2015   Yes Historical Provider, MD  Multiple Vitamins-Minerals (MULTIVITAMIN ADULT PO) Take 1 tablet by  mouth daily. Reported on 09/23/2015   Yes Historical Provider, MD  Omega 3 1000 MG CAPS Take 2,000 mg by mouth daily. Reported on 09/23/2015   Yes Historical Provider, MD  POTASSIUM PO Take 595 mg by mouth daily. Reported on 09/23/2015   Yes Historical Provider, MD  ranitidine (ZANTAC) 300 MG tablet Take 1 tablet (300 mg total) by mouth at bedtime. 07/29/15  Yes Glean Hess, MD  valACYclovir (VALTREX) 1000 MG tablet TAKE 1 TABLET DAILY 04/22/16  Yes Glean Hess, MD      Allergies  Allergen Reactions  . Hydrocodone Nausea Only and Anxiety  . Cymbalta [Duloxetine Hcl]     Constipation   . Lyrica [Pregabalin] Other (See Comments)    "drunk feeling"    Past Surgical History:  Procedure Laterality Date  . COLONOSCOPY WITH PROPOFOL N/A 06/01/2015   Procedure: COLONOSCOPY WITH PROPOFOL;  Surgeon: [name], MD;  Location: [facility];  Service: Endoscopy;  Laterality: N/A;  ASCENDING COLON POLYP DESCENDING COLON POLYP TRANSVERSE COLON POLYP  . EYE SURGERY     Age 51   . SALIVARY GLAND SURGERY    . SHOULDER ARTHROSCOPY Left 03/2014   bicep and nerve problems still  . TONSILLECTOMY    . UMBILICAL HERNIA REPAIR N/A 08/25/2015   Procedure: HERNIA REPAIR UMBILICAL ADULT;  Surgeon: Jules Husbands, MD;  Location: [facility];  Service: General;  Laterality: N/A;    Social History  Substance Use Topics  . Smoking status: Former Smoker    Packs/day: 2.00    Years: 33.00    Types: Cigarettes    Start date: 06/28/1971    Quit date: 12/25/2005  . Smokeless tobacco: Former Systems developer    Types: Chew  . Alcohol use No     Comment: Quit a long time ago     Medication list has been reviewed and updated.   Physical Exam  Constitutional: He is oriented to person, place, and time. He appears well-developed. No distress.  HENT:  Head: Normocephalic and atraumatic.  Neck: Normal range of motion. Neck supple.  Cardiovascular: Normal rate, regular rhythm and normal heart sounds.   Pulmonary/Chest: Effort normal and breath sounds normal. No respiratory distress.  Musculoskeletal: He exhibits tenderness. He exhibits no edema.       Left ankle: Tenderness.  Tender lower left leg near ankle - no edema, redness, calf tenderness or cord  Neurological: He is alert and oriented to person, place, and time.  Skin: Skin is warm and dry. No rash noted.  Psychiatric: He has a normal mood and affect. His behavior is normal. Thought content normal.  Nursing note and  vitals reviewed.   BP 112/73   Pulse 92   Temp 97.6 F (36.4 C)   Ht 6' (1.829 m)   Wt 198 lb (89.8 kg)   SpO2 97%   BMI 26.85 kg/m   Assessment and Plan: 1. Proteinuria, unspecified type Repeat negative - pt reassured - POCT urinalysis dipstick  2. Complex regional pain syndrome type 2 of left upper extremity Followed by Orthopedics  3. Neuropathy (HCC) Likely sequelae from CVA Re-try low dose gabapentin at hs - gabapentin (NEURONTIN) 100 MG capsule; Take 1 capsule (100 mg total) by mouth at bedtime.  Dispense: 30 capsule; Refill: [place], MD [facility]  06/24/2016

## 2016-07-02 ENCOUNTER — Observation Stay: Payer: Commercial Managed Care - PPO

## 2016-07-02 ENCOUNTER — Observation Stay
Admission: EM | Admit: 2016-07-02 | Discharge: 2016-07-02 | Disposition: A | Discharge status: Home / Self Care | Payer: Commercial Managed Care - PPO | Attending: Internal Medicine | Admitting: Internal Medicine

## 2016-07-02 ENCOUNTER — Emergency Department: Payer: Commercial Managed Care - PPO

## 2016-07-02 DIAGNOSIS — K219 Gastro-esophageal reflux disease without esophagitis: Secondary | ICD-10-CM | POA: Diagnosis not present

## 2016-07-02 DIAGNOSIS — E876 Hypokalemia: Secondary | ICD-10-CM | POA: Insufficient documentation

## 2016-07-02 DIAGNOSIS — I69351 Hemiplegia and hemiparesis following cerebral infarction affecting right dominant side: Secondary | ICD-10-CM | POA: Diagnosis not present

## 2016-07-02 DIAGNOSIS — I639 Cerebral infarction, unspecified: Secondary | ICD-10-CM | POA: Diagnosis present

## 2016-07-02 DIAGNOSIS — Z885 Allergy status to narcotic agent status: Secondary | ICD-10-CM | POA: Diagnosis not present

## 2016-07-02 DIAGNOSIS — G4730 Sleep apnea, unspecified: Secondary | ICD-10-CM | POA: Insufficient documentation

## 2016-07-02 DIAGNOSIS — IMO0002 Reserved for concepts with insufficient information to code with codable children: Secondary | ICD-10-CM

## 2016-07-02 DIAGNOSIS — I63332 Cerebral infarction due to thrombosis of left posterior cerebral artery: Secondary | ICD-10-CM | POA: Diagnosis not present

## 2016-07-02 DIAGNOSIS — I69392 Facial weakness following cerebral infarction: Secondary | ICD-10-CM | POA: Diagnosis present

## 2016-07-02 DIAGNOSIS — Z7982 Long term (current) use of aspirin: Secondary | ICD-10-CM | POA: Diagnosis not present

## 2016-07-02 DIAGNOSIS — Z823 Family history of stroke: Secondary | ICD-10-CM | POA: Diagnosis not present

## 2016-07-02 DIAGNOSIS — I69398 Other sequelae of cerebral infarction: Secondary | ICD-10-CM | POA: Diagnosis not present

## 2016-07-02 DIAGNOSIS — G459 Transient cerebral ischemic attack, unspecified: Principal | ICD-10-CM | POA: Insufficient documentation

## 2016-07-02 DIAGNOSIS — Z888 Allergy status to other drugs, medicaments and biological substances status: Secondary | ICD-10-CM | POA: Insufficient documentation

## 2016-07-02 DIAGNOSIS — F329 Major depressive disorder, single episode, unspecified: Secondary | ICD-10-CM | POA: Diagnosis not present

## 2016-07-02 DIAGNOSIS — F410 Panic disorder [episodic paroxysmal anxiety] without agoraphobia: Secondary | ICD-10-CM | POA: Insufficient documentation

## 2016-07-02 DIAGNOSIS — H9193 Unspecified hearing loss, bilateral: Secondary | ICD-10-CM | POA: Insufficient documentation

## 2016-07-02 LAB — COMPREHENSIVE METABOLIC PANEL
ALBUMIN: 4.6 g/dL (ref 3.5–5.0)
ALT: 30 U/L (ref 17–63)
ANION GAP: 5 (ref 5–15)
AST: 27 U/L (ref 15–41)
Alkaline Phosphatase: 72 U/L (ref 38–126)
BILIRUBIN TOTAL: 1.2 mg/dL (ref 0.3–1.2)
BUN: 14 mg/dL (ref 6–20)
CO2: 29 mmol/L (ref 22–32)
Calcium: 9 mg/dL (ref 8.9–10.3)
Chloride: 105 mmol/L (ref 101–111)
Creatinine, Ser: 1.05 mg/dL (ref 0.61–1.24)
GFR calc Af Amer: >60 mL/min (ref >60)
Glucose, Bld: 112 mg/dL — ABNORMAL HIGH (ref 65–99)
POTASSIUM: 3.4 mmol/L — AB (ref 3.5–5.1)
Sodium: 139 mmol/L (ref 135–145)
TOTAL PROTEIN: 7.6 g/dL (ref 6.5–8.1)

## 2016-07-02 LAB — URINE DRUG SCREEN, QUALITATIVE (ARMC ONLY)
AMPHETAMINES, UR SCREEN: NOT DETECTED
BENZODIAZEPINE, UR SCRN: NOT DETECTED
Barbiturates, Ur Screen: NOT DETECTED
Cannabinoid 50 Ng, Ur ~~LOC~~: NOT DETECTED
Cocaine Metabolite,Ur ~~LOC~~: NOT DETECTED
MDMA (ECSTASY) UR SCREEN: NOT DETECTED
METHADONE SCREEN, URINE: NOT DETECTED
Opiate, Ur Screen: NOT DETECTED
Phencyclidine (PCP) Ur S: NOT DETECTED
TRICYCLIC, UR SCREEN: POSITIVE — AB

## 2016-07-02 LAB — CBC
HCT: 45.6 % (ref 40.0–52.0)
HEMOGLOBIN: 16.2 g/dL (ref 13.0–18.0)
MCH: 35.4 pg — AB (ref 26.0–34.0)
MCHC: 35.6 g/dL (ref 32.0–36.0)
MCV: 99.4 fL (ref 80.0–100.0)
Platelets: 232 10*3/uL (ref 150–440)
RBC: 4.58 MIL/uL (ref 4.40–5.90)
RDW: 12.9 % (ref 11.5–14.5)
WBC: 11.3 10*3/uL — ABNORMAL HIGH (ref 3.8–10.6)

## 2016-07-02 LAB — DIFFERENTIAL
Basophils Absolute: 0.1 10*3/uL (ref 0–0.1)
Basophils Relative: 1 %
EOS ABS: 0.2 10*3/uL (ref 0–0.7)
EOS PCT: 2 %
LYMPHS ABS: 4 10*3/uL — AB (ref 1.0–3.6)
Lymphocytes Relative: 35 %
MONOS PCT: 9 %
Monocytes Absolute: 1 10*3/uL (ref 0.2–1.0)
NEUTROS PCT: 53 %
Neutro Abs: 6 10*3/uL (ref 1.4–6.5)

## 2016-07-02 LAB — PROTIME-INR
INR: 1.05
Prothrombin Time: 13.7 seconds (ref 11.4–15.2)

## 2016-07-02 LAB — TROPONIN I

## 2016-07-02 LAB — TYPE AND SCREEN
ABO/RH(D): O POS
ANTIBODY SCREEN: NEGATIVE

## 2016-07-02 LAB — ETHANOL

## 2016-07-02 LAB — APTT: aPTT: 34 seconds (ref 24–36)

## 2016-07-02 MED ORDER — DULOXETINE HCL 60 MG PO CPEP
60.0000 mg | ORAL_CAPSULE | Freq: Every day | ORAL | Status: DC
Start: 2016-07-02 — End: 2016-07-02
  Administered 2016-07-02: 11:00:00 60 mg via ORAL
  Filled 2016-07-02: qty 1

## 2016-07-02 MED ORDER — AMITRIPTYLINE HCL 10 MG PO TABS
10.0000 mg | ORAL_TABLET | Freq: Every day | ORAL | Status: DC
  Filled 2016-07-02: qty 1

## 2016-07-02 MED ORDER — VITAMIN C 500 MG PO TABS: 250.0000 mg | ORAL_TABLET | Freq: Every day | ORAL | Status: DC

## 2016-07-02 MED ORDER — ASPIRIN 325 MG PO TBEC: 325.0000 mg | DELAYED_RELEASE_TABLET | Freq: Every day | ORAL | 0 refills | Status: DC

## 2016-07-02 MED ORDER — ACETAMINOPHEN 325 MG PO TABS: 650.0000 mg | ORAL_TABLET | ORAL | Status: DC | PRN

## 2016-07-02 MED ORDER — VALACYCLOVIR HCL 500 MG PO TABS
1000.0000 mg | ORAL_TABLET | Freq: Every day | ORAL | Status: DC
  Administered 2016-07-02: 11:00:00 1000 mg via ORAL
  Filled 2016-07-02: qty 2

## 2016-07-02 MED ORDER — STROKE: EARLY STAGES OF RECOVERY BOOK
Freq: Once | Status: AC
  Administered 2016-07-02: 08:00:00

## 2016-07-02 MED ORDER — ENOXAPARIN SODIUM 40 MG/0.4ML ~~LOC~~ SOLN
40.0000 mg | SUBCUTANEOUS | Status: DC
  Filled 2016-07-02: qty 0.4

## 2016-07-02 MED ORDER — ACETAMINOPHEN 325 MG PO TABS: 650.0000 mg | ORAL_TABLET | Freq: Four times a day (QID) | ORAL | Status: DC | PRN

## 2016-07-02 MED ORDER — OMEGA-3-ACID ETHYL ESTERS 1 G PO CAPS
1.0000 g | ORAL_CAPSULE | Freq: Two times a day (BID) | ORAL | Status: DC
  Administered 2016-07-02: 1 g via ORAL
  Filled 2016-07-02: qty 1

## 2016-07-02 MED ORDER — VITAMIN C 500 MG PO TABS
500.0000 mg | ORAL_TABLET | Freq: Every day | ORAL | Status: DC
  Administered 2016-07-02: 11:00:00 500 mg via ORAL
  Filled 2016-07-02: qty 1

## 2016-07-02 MED ORDER — ADULT MULTIVITAMIN W/MINERALS CH
ORAL_TABLET | Freq: Every day | ORAL | Status: DC
  Administered 2016-07-02: 11:00:00 1 via ORAL
  Filled 2016-07-02: qty 1

## 2016-07-02 MED ORDER — CHOLECALCIFEROL 10 MCG (400 UNIT) PO TABS
400.0000 [IU] | ORAL_TABLET | Freq: Every day | ORAL | Status: DC
  Administered 2016-07-02: 400 [IU] via ORAL
  Filled 2016-07-02: qty 1

## 2016-07-02 MED ORDER — ASPIRIN 81 MG PO CHEW
324.0000 mg | CHEWABLE_TABLET | Freq: Once | ORAL | Status: AC
  Administered 2016-07-02: 324 mg via ORAL
  Filled 2016-07-02: qty 4

## 2016-07-02 MED ORDER — SENNOSIDES-DOCUSATE SODIUM 8.6-50 MG PO TABS: 1.0000 | ORAL_TABLET | Freq: Every evening | ORAL | Status: DC | PRN

## 2016-07-02 MED ORDER — SODIUM CHLORIDE 0.9 % IV SOLN
INTRAVENOUS | Status: DC
  Administered 2016-07-02: 08:00:00 via INTRAVENOUS

## 2016-07-02 MED ORDER — OMEGA 3 1000 MG PO CAPS: 2000.0000 mg | ORAL_CAPSULE | Freq: Every day | ORAL | Status: DC

## 2016-07-02 MED ORDER — MAGNESIUM OXIDE 400 (241.3 MG) MG PO TABS
400.0000 mg | ORAL_TABLET | Freq: Every day | ORAL | Status: DC
  Administered 2016-07-02: 400 mg via ORAL
  Filled 2016-07-02: qty 1

## 2016-07-02 MED ORDER — ACETAMINOPHEN 160 MG/5ML PO SOLN: 650.0000 mg | ORAL | Status: DC | PRN

## 2016-07-02 MED ORDER — GABAPENTIN 100 MG PO CAPS: 100.0000 mg | ORAL_CAPSULE | Freq: Every day | ORAL | Status: DC

## 2016-07-02 MED ORDER — ACETAMINOPHEN 650 MG RE SUPP: 650.0000 mg | RECTAL | Status: DC | PRN

## 2016-07-02 MED ORDER — ASPIRIN EC 325 MG PO TBEC: 325.0000 mg | DELAYED_RELEASE_TABLET | Freq: Every day | ORAL | Status: DC

## 2016-07-02 MED ORDER — MAGNESIUM 250 MG PO TABS: 250.0000 mg | ORAL_TABLET | Freq: Every day | ORAL | Status: DC

## 2016-07-02 MED ORDER — POTASSIUM CHLORIDE CRYS ER 20 MEQ PO TBCR
20.0000 meq | EXTENDED_RELEASE_TABLET | Freq: Once | ORAL | Status: AC
  Administered 2016-07-02: 20 meq via ORAL
  Filled 2016-07-02: qty 1

## 2016-07-02 MED ORDER — GARLIC 1000 MG PO CAPS: 1000.0000 mg | ORAL_CAPSULE | Freq: Every day | ORAL | Status: DC

## 2016-07-02 MED ORDER — ASPIRIN 300 MG RE SUPP: 300.0000 mg | Freq: Every day | RECTAL | Status: DC

## 2016-07-02 MED ORDER — FAMOTIDINE 20 MG PO TABS
20.0000 mg | ORAL_TABLET | Freq: Every day | ORAL | Status: DC
  Administered 2016-07-02: 20 mg via ORAL
  Filled 2016-07-02: qty 1

## 2016-07-02 MED ORDER — LORATADINE 10 MG PO TABS
10.0000 mg | ORAL_TABLET | Freq: Every day | ORAL | Status: DC
  Administered 2016-07-02: 10 mg via ORAL
  Filled 2016-07-02: qty 1

## 2016-07-02 NOTE — ED Triage Notes (Signed)
Pt arrives to ED via POV from home with c/o stroke-like s/x's. Per pt's wife, pt got home from work at 98 am tonight and felt fine. Went to go to the BR at 2am when s/x's were first discovered. Pt's wife reports finding pt sitting on floor in BR at that time; no fall, pt had sit down when he began felling numbness and weakness. Pt reports RIGHT-sided facial numbness and drooping, pt reports RLE and RUE numbness and tingling, as well. Pt's wife denies any c/o difficulty speaking or swallowing at this time. Pt taken immediately to CT upon arrival; h/x obtained from wife in ED Rm 5.

## 2016-07-02 NOTE — Progress Notes (Addendum)
SLP Cancellation Note  Patient Details Name: Jeffrey Stokes MRN: [id number] DOB: May 16, 1961   Cancelled treatment:       Reason Eval/Treat Not Completed: SLP screened, no needs identified, will sign off (reviewed chart notes; consulted NSG, patient). Pt denied any difficulty w/ swallowing (ate full breakfast meal this morning); he denied any changes in his speech. He was able to converse w/ SLP and PT present indicating wants/needs appropriately, appeared fully A/O. Pt stated he had had 2-3 strokes in the past w/ min memory changes post the first one in "2009".  Instructed pt to f/u w/ primary MD once discharged home if he feels he has any changes from his baseline in order to seek outpatient ST services for evaluation if desired. NSG to reconsult if any changes in status while admitted. Pt agreed.   Orinda Kenner, MS, CCC-SLP Watson,Katherine 07/02/2016, 9:59 AM

## 2016-07-02 NOTE — Progress Notes (Signed)
Chaplain rounded the unit to provide a compassionate presence and support to the patient and family. Chaplain provided prayer at the request of the patient.  Jeffrey Stokes [phone]

## 2016-07-02 NOTE — H&P (Signed)
[facility] [place] NAME: Jeffrey Stokes    MR#:  [id number]  DATE OF BIRTH:  1961-02-04  DATE OF ADMISSION:  07/02/2016  PRIMARY CARE PHYSICIAN: Halina Maidens, MD   REQUESTING/REFERRING PHYSICIAN:   CHIEF COMPLAINT:   Chief Complaint  Patient presents with  . Code Stroke    HISTORY OF PRESENT ILLNESS: Jeffrey Stokes  is a 56 y.o. male with a known history of Transient ischemic attack, GERD, anxiety disorder, sleep apnea presented to the emergency room with numbness around the right eye and right facial area. Patient had the symptoms around 2 AM this morning. He got off work around 11:30 PM last night and went home. Patient came to the emergency room but is a numbness around the right eye in the right facial numbness have completely resolved. No weakness in any extremity. No complaints of any slurred speech. Patient was evaluated in the emergency room with a CT head which showed thalamic CVA with indeterminate chronology. No complaints of any headache, seizures. Hospitalist service was consulted for further care of the patient.  PAST MEDICAL HISTORY:   Past Medical History:  Diagnosis Date  . Allergy   . Anxiety   . Depression   . GERD (gastroesophageal reflux disease)   . History of motion sickness    laying on back  . HOH (hard of hearing)    bilateral  . Hx-TIA (transient ischemic attack) 2012  . Sleep apnea    had one sleep study, inconclusive,needs repeat  . Stroke [facility])    3 strokes last one on 2012/ mouth tilt/ weakness right hand side  . Wears dentures     PAST SURGICAL HISTORY: Past Surgical History:  Procedure Laterality Date  . COLONOSCOPY WITH PROPOFOL N/A 06/01/2015   Procedure: COLONOSCOPY WITH PROPOFOL;  Surgeon: [name], MD;  Location: [facility];  Service: Endoscopy;  Laterality: N/A;  ASCENDING COLON POLYP DESCENDING COLON POLYP TRANSVERSE COLON POLYP  . EYE SURGERY     Age 56   . SALIVARY GLAND  SURGERY    . SHOULDER ARTHROSCOPY Left 03/2014   bicep and nerve problems still  . TONSILLECTOMY    . UMBILICAL HERNIA REPAIR N/A 08/25/2015   Procedure: HERNIA REPAIR UMBILICAL ADULT;  Surgeon: Jules Husbands, MD;  Location: [facility];  Service: General;  Laterality: N/A;    SOCIAL HISTORY:  Social History  Substance Use Topics  . Smoking status: Former Smoker    Packs/day: 2.00    Years: 33.00    Types: Cigarettes    Start date: 06/28/1971    Quit date: 12/25/2005  . Smokeless tobacco: Former Systems developer    Types: Chew  . Alcohol use No     Comment: Quit a long time ago    FAMILY HISTORY:  Family History  Problem Relation Age of Onset  . Osteoarthritis Father   . Stroke Father     DRUG ALLERGIES:  Allergies  Allergen Reactions  . Hydrocodone Nausea Only and Anxiety  . Cymbalta [Duloxetine Hcl]     Constipation   . Lyrica [Pregabalin] Other (See Comments)    "drunk feeling"    REVIEW OF SYSTEMS:   CONSTITUTIONAL: No fever, fatigue or weakness.  EYES: No blurred or double vision.  EARS, NOSE, AND THROAT: No tinnitus or ear pain.  RESPIRATORY: No cough, shortness of breath, wheezing or hemoptysis.  CARDIOVASCULAR: No chest pain, orthopnea, edema.  GASTROINTESTINAL: No nausea, vomiting, diarrhea or abdominal pain.  GENITOURINARY: No dysuria, hematuria.  ENDOCRINE: No polyuria, nocturia,  HEMATOLOGY: No anemia, easy bruising or bleeding SKIN: No rash or lesion. MUSCULOSKELETAL: No joint pain or arthritis.   NEUROLOGIC: No tingling, left facial numbness, no weakness.  PSYCHIATRY: No anxiety or depression.   MEDICATIONS AT HOME:  Prior to Admission medications   Medication Sig Start Date End Date Taking? Authorizing Provider  acetaminophen (TYLENOL) 325 MG tablet Take 650 mg by mouth every 6 (six) hours as needed.   Yes Historical Provider, MD  amitriptyline (ELAVIL) 10 MG tablet Take 10 mg by mouth. 02/12/16 02/11/17 Yes Historical Provider, MD  Ascorbic Acid (VITAMIN C PO)  Take 1 tablet by mouth daily. Reported on 09/23/2015   Yes Historical Provider, MD  cetirizine (ZYRTEC) 10 MG tablet Take 10 mg by mouth at bedtime.    Yes Historical Provider, MD  Cholecalciferol (VITAMIN D PO) Take 1 tablet by mouth daily. Reported on 09/23/2015   Yes Historical Provider, MD  Cyanocobalamin (B-12 PO) Take 1 tablet by mouth daily. Reported on 09/23/2015   Yes Historical Provider, MD  DULoxetine (CYMBALTA) 60 MG capsule Take 60 mg by mouth daily.   Yes Historical Provider, MD  fluticasone (FLONASE) 50 MCG/ACT nasal spray Place 2 sprays into both nostrils as needed. Reported on 09/23/2015 12/25/14  Yes Historical Provider, MD  gabapentin (NEURONTIN) 100 MG capsule Take 1 capsule (100 mg total) by mouth at bedtime. 06/24/16  Yes Glean Hess, MD  Garlic 123XX123 MG CAPS Take 1,000 mg by mouth daily. Reported on 09/23/2015   Yes Historical Provider, MD  lidocaine (LIDODERM) 5 % Apply patch to painful area. Patch may remain in place for up to 12 hours in a 24 hour period. 04/20/16  Yes Historical Provider, MD  Magnesium 250 MG TABS Take 250 mg by mouth daily. Reported on 09/23/2015   Yes Historical Provider, MD  Multiple Vitamins-Minerals (MULTIVITAMIN ADULT PO) Take 1 tablet by mouth daily. Reported on 09/23/2015   Yes Historical Provider, MD  Omega 3 1000 MG CAPS Take 2,000 mg by mouth daily. Reported on 09/23/2015   Yes Historical Provider, MD  POTASSIUM PO Take 595 mg by mouth daily. Reported on 09/23/2015   Yes Historical Provider, MD  ranitidine (ZANTAC) 300 MG tablet Take 1 tablet (300 mg total) by mouth at bedtime. 07/29/15  Yes Glean Hess, MD  valACYclovir (VALTREX) 1000 MG tablet TAKE 1 TABLET DAILY 04/22/16  Yes Glean Hess, MD      PHYSICAL EXAMINATION:   VITAL SIGNS: Blood pressure 127/84, pulse (!) 58, temperature 97.4 F (36.3 C), resp. rate 16, height 6' (1.829 m), weight 87.5 kg (193 lb), SpO2 98 %.  GENERAL:  56 y.o.-year-old well built male patient lying in the  bed with no acute distress.  EYES: Pupils equal, round, reactive to light and accommodation. No scleral icterus. Extraocular muscles intact.  HEENT: Head atraumatic, normocephalic. Oropharynx and nasopharynx clear.  NECK:  Supple, no jugular venous distention. No thyroid enlargement, no tenderness.  LUNGS: Normal breath sounds bilaterally, no wheezing, rales,rhonchi or crepitation. No use of accessory muscles of respiration.  CARDIOVASCULAR: S1, S2 normal. No murmurs, rubs, or gallops.  ABDOMEN: Soft, nontender, nondistended. Bowel sounds present. No organomegaly or mass.  EXTREMITIES: No pedal edema, cyanosis, or clubbing.  NEUROLOGIC: Cranial nerves II through XII are intact. Muscle strength 5/5 in all extremities except left upper extremity weakness secondary to biceps weakness. Sensation intact. Gait not checked.  PSYCHIATRIC: The patient is alert and oriented  x 3.  SKIN: No obvious rash, lesion, or ulcer.   LABORATORY PANEL:   CBC  Recent Labs Lab 07/02/16 0301  WBC 11.3*  HGB 16.2  HCT 45.6  PLT 232  MCV 99.4  MCH 35.4*  MCHC 35.6  RDW 12.9  LYMPHSABS 4.0*  MONOABS 1.0  EOSABS 0.2  BASOSABS 0.1   ------------------------------------------------------------------------------------------------------------------  Chemistries   Recent Labs Lab 07/02/16 0301  NA 139  K 3.4*  CL 105  CO2 29  GLUCOSE 112*  BUN 14  CREATININE 1.05  CALCIUM 9.0  AST 27  ALT 30  ALKPHOS 72  BILITOT 1.2   ------------------------------------------------------------------------------------------------------------------ estimated creatinine clearance is 89.3 mL/min (by C-G formula based on SCr of 1.05 mg/dL). ------------------------------------------------------------------------------------------------------------------ No results for input(s): TSH, T4TOTAL, T3FREE, THYROIDAB in the last 72 hours.  Invalid input(s): FREET3   Coagulation profile  Recent Labs Lab 07/02/16 0301   INR 1.05   ------------------------------------------------------------------------------------------------------------------- No results for input(s): DDIMER in the last 72 hours. -------------------------------------------------------------------------------------------------------------------  Cardiac Enzymes  Recent Labs Lab 07/02/16 0301  TROPONINI <0.03   ------------------------------------------------------------------------------------------------------------------ Invalid input(s): POCBNP  ---------------------------------------------------------------------------------------------------------------  Urinalysis    Component Value Date/Time   BILIRUBINUR small 06/24/2016 1547   PROTEINUR neg 06/24/2016 1547   UROBILINOGEN 0.2 06/24/2016 1547   NITRITE neg 06/24/2016 1547   LEUKOCYTESUR Negative 06/24/2016 1547     RADIOLOGY: Ct Head Code Stroke W/o Cm  Result Date: 07/02/2016 CLINICAL DATA:  Code stroke. 56 y/o M; right-sided facial numbness. EXAM: CT HEAD WITHOUT CONTRAST TECHNIQUE: Contiguous axial images were obtained from the base of the skull through the vertex without intravenous contrast. COMPARISON:  07/01/2010 CT of the head. 07/02/2010 MRI of the brain. FINDINGS: Brain: Lucency and left thalamus not present on prior study (series 3, image 14). Otherwise no evidence for large acute territory infarct, focal mass effect, or intracranial hemorrhage. No extra-axial collection or hydrocephalus. Vascular: No hyperdense vessel. Skull: Normal. Negative for fracture or focal lesion. Sinuses/Orbits: No acute finding. Other: None. ASPECTS [facility] Stroke Program Early CT Score) - Ganglionic level infarction (caudate, lentiform nuclei, internal capsule, insula, M1-M3 cortex): 7 - Supraganglionic infarction (M4-M6 cortex): 3 Total score (0-10 with 10 being normal): 10 IMPRESSION: 1. Lucency in the left thalamus not present on prior study may represent age-indeterminate lacunar  infarct. This can be further characterized with MRI if clinically indicated. 2. No large territory infarct, focal mass effect, or intracranial hemorrhage. 3. ASPECTS is 10 These results were called by telephone at the time of interpretation on 07/02/2016 at 3:04 am to Dr. Karma Greaser, who verbally acknowledged these results. Electronically Signed   By: Kristine Garbe M.D.   On: 07/02/2016 03:11    EKG: Orders placed or performed during the hospital encounter of 07/02/16  . ED EKG  . ED EKG  . EKG 12-Lead  . EKG 12-Lead    IMPRESSION AND PLAN: 56 year old male patient with history of TIA, GERD sleep apnea presented to the emergency room with right facial numbness . Admitting diagnosis 1. Left thalamic CVA ischemic etiology 2. GERD 3. Sleep apnea 4. Hypokalemia Treatment plan Admit patient to telemetry observation bed Stroke workup with MRI brain, MRA brain, carotid ultrasound and echocardiogram Start patient on aspirin Replace potassium Neurology consultation.  All the records are reviewed and case discussed with ED provider. Management plans discussed with the patient, family and they are in agreement.  CODE STATUS:FULL CODE Code Status History    This patient does not have a recorded code status. Please follow  your organizational policy for patients in this situation.       TOTAL TIME TAKING CARE OF THIS PATIENT: 50 minutes.    Saundra Shelling M.D on 07/02/2016 at 5:40 AM  Between 7am to 6pm - Pager - [phone]  After 6pm go to [email or web address] - password [email or web address] [facility]  Office  [phone]  CC: Primary care physician; Halina Maidens, MD

## 2016-07-02 NOTE — ED Provider Notes (Signed)
[facility] Emergency Department Provider Note  ____________________________________________   First MD Initiated Contact with Patient 07/02/16 0301     (approximate)  I have reviewed the triage vital signs and the nursing notes.   HISTORY  Chief Complaint Code Stroke    HPI Jeffrey Stokes is a 56 y.o. male with a history of one prior CVA and 2 prior TIAs but he was not on any anticoagulation presents for acute onset strokelike symptoms that include right-sided facial droop and right-sided numbness and tingling.  He also states that his right upper extremity may be tingly as well but that has improved.  He has had no difficulty speaking or swallowing or finding his words.  He reports that the onset was acute within the last hour or so prior to arrival and that it started with an acute pain in his eye which turns out to be a subconjunctival hemorrhage; he states that that has happened each time he has had a stroke.  Is having no blurry vision, and he denies fever/chills, chest pain, shortness of breath, nausea, vomiting, abdominal pain.  The symptoms are mild but concerning to him given his history.  He was identified as a code stroke in triage and taken immediately to CT scan and then to an exam room.   Past Medical History:  Diagnosis Date  . Allergy   . Anxiety   . Depression   . GERD (gastroesophageal reflux disease)   . History of motion sickness    laying on back  . HOH (hard of hearing)    bilateral  . Hx-TIA (transient ischemic attack) 2012  . Sleep apnea    had one sleep study, inconclusive,needs repeat  . Stroke [facility])    3 strokes last one on 2012/ mouth tilt/ weakness right hand side  . Wears dentures     Patient Active Problem List   Diagnosis Date Noted  . Ulnar neuritis, left 04/20/2016  . Complex regional pain syndrome type 2 of left upper extremity 02/12/2016  . Rhinitis, allergic 07/29/2015  . Benign neoplasm of ascending colon   .  Benign neoplasm of transverse colon   . Benign neoplasm of descending colon   . Hemorrhoid 05/12/2015  . LPRD (laryngopharyngeal reflux disease) 03/31/2015  . Depression 03/31/2015  . Panic disorder without agoraphobia 03/31/2015  . Cerebral vascular accident ([place]) 12/03/2014  . Herpes 12/03/2014  . Obstructive apnea 12/03/2014  . Disorder of rotator cuff 12/03/2014  . Bilateral carotid artery disease ([place]) 04/15/2014  . Knee stiffness 03/03/2011    Past Surgical History:  Procedure Laterality Date  . COLONOSCOPY WITH PROPOFOL N/A 06/01/2015   Procedure: COLONOSCOPY WITH PROPOFOL;  Surgeon: [name], MD;  Location: [place];  Service: Endoscopy;  Laterality: N/A;  ASCENDING COLON POLYP DESCENDING COLON POLYP TRANSVERSE COLON POLYP  . EYE SURGERY     Age 89   . SALIVARY GLAND SURGERY    . SHOULDER ARTHROSCOPY Left 03/2014   bicep and nerve problems still  . TONSILLECTOMY    . UMBILICAL HERNIA REPAIR N/A 08/25/2015   Procedure: HERNIA REPAIR UMBILICAL ADULT;  Surgeon: Jules Husbands, MD;  Location: [facility];  Service: General;  Laterality: N/A;    Prior to Admission medications   Medication Sig Start Date End Date Taking? Authorizing Provider  acetaminophen (TYLENOL) 325 MG tablet Take 650 mg by mouth every 6 (six) hours as needed.    Historical Provider, MD  amitriptyline (ELAVIL) 10 MG tablet Take  10 mg by mouth. 02/12/16 02/11/17  Historical Provider, MD  Ascorbic Acid (VITAMIN C PO) Take 1 tablet by mouth daily. Reported on 09/23/2015    Historical Provider, MD  cetirizine (ZYRTEC) 10 MG tablet Take 10 mg by mouth at bedtime.     Historical Provider, MD  Cholecalciferol (VITAMIN D PO) Take 1 tablet by mouth daily. Reported on 09/23/2015    Historical Provider, MD  Cyanocobalamin (B-12 PO) Take 1 tablet by mouth daily. Reported on 09/23/2015    Historical Provider, MD  DULoxetine (CYMBALTA) 60 MG capsule Take 60 mg by mouth daily.    Historical Provider, MD  fluticasone  (FLONASE) 50 MCG/ACT nasal spray Place 2 sprays into both nostrils as needed. Reported on 09/23/2015 12/25/14   Historical Provider, MD  gabapentin (NEURONTIN) 100 MG capsule Take 1 capsule (100 mg total) by mouth at bedtime. 06/24/16   Glean Hess, MD  Garlic [id number] MG CAPS Take 1,000 mg by mouth daily. Reported on 09/23/2015    Historical Provider, MD  lidocaine (LIDODERM) 5 % Apply patch to painful area. Patch may remain in place for up to 12 hours in a 24 hour period. 04/20/16   Historical Provider, MD  Magnesium 250 MG TABS Take 250 mg by mouth daily. Reported on 09/23/2015    Historical Provider, MD  Multiple Vitamins-Minerals (MULTIVITAMIN ADULT PO) Take 1 tablet by mouth daily. Reported on 09/23/2015    Historical Provider, MD  Omega 3 1000 MG CAPS Take 2,000 mg by mouth daily. Reported on 09/23/2015    Historical Provider, MD  POTASSIUM PO Take 595 mg by mouth daily. Reported on 09/23/2015    Historical Provider, MD  ranitidine (ZANTAC) 300 MG tablet Take 1 tablet (300 mg total) by mouth at bedtime. 07/29/15   Glean Hess, MD  valACYclovir (VALTREX) 1000 MG tablet TAKE 1 TABLET DAILY 04/22/16   Glean Hess, MD    Allergies Hydrocodone; Cymbalta [duloxetine hcl]; and Lyrica [pregabalin]  Family History  Problem Relation Age of Onset  . Osteoarthritis Father   . Stroke Father     Social History Social History  Substance Use Topics  . Smoking status: Former Smoker    Packs/day: 2.00    Years: 33.00    Types: Cigarettes    Start date: 06/28/1971    Quit date: 12/25/2005  . Smokeless tobacco: Former Systems developer    Types: Chew  . Alcohol use No     Comment: Quit a long time ago    Review of Systems Constitutional: No fever/chills Eyes: No visual changes.  Acute rupture of blood vessel in right eye ENT: No sore throat. Cardiovascular: Denies chest pain. Respiratory: Denies shortness of breath. Gastrointestinal: No abdominal pain.  No nausea, no vomiting.  No diarrhea.  No  constipation. Genitourinary: Negative for dysuria. Musculoskeletal: Negative for back pain. Skin: Negative for rash. Neurological: Mild right sided facial droop, mild tingling in RUE  10-point ROS otherwise negative.  ____________________________________________   PHYSICAL EXAM:  VITAL SIGNS: ED Triage Vitals  Enc Vitals Group     BP 07/02/16 0304 (!) 124/91     Pulse Rate 07/02/16 0304 62     Resp 07/02/16 0304 16     Temp --      Temp src --      SpO2 07/02/16 0304 100 %     Weight 07/02/16 0256 193 lb (87.5 kg)     Height 07/02/16 0256 6' (1.829 m)     Head Circumference --  Peak Flow --      Pain Score 07/02/16 0256 0     Pain Loc --      Pain Edu? --      Excl. in [place]? --     Constitutional: Alert and oriented. Well appearing and in no acute distress. Eyes: Right lateral subconjunctival hemorrhage. PERRL. EOMI. no nystagmus Head: Atraumatic. Nose: No congestion/rhinnorhea. Mouth/Throat: Mucous membranes are moist.  Oropharynx non-erythematous. Neck: No stridor.  No meningeal signs.   Cardiovascular: Normal rate, regular rhythm. Good peripheral circulation. Grossly normal heart sounds. Respiratory: Normal respiratory effort.  No retractions. Lungs CTAB. Gastrointestinal: Soft and nontender. No distention.  Musculoskeletal: No lower extremity tenderness nor edema. No gross deformities of extremities. Neurologic:  Normal speech and language.  Mild right-sided facial droop which his wife says is more pronounced than normal.  Normal major muscle group strength.  See NIH stroke scale below Skin:  Skin is warm, dry and intact. No rash noted. Psychiatric: Mood and affect are normal. Speech and behavior are normal.  ____________________________________________   LABS (all labs ordered are listed, but only abnormal results are displayed)  Labs Reviewed  CBC - Abnormal; Notable for the following:       Result Value   WBC 11.3 (*)    MCH 35.4 (*)    All other  components within normal limits  DIFFERENTIAL - Abnormal; Notable for the following:    Lymphs Abs 4.0 (*)    All other components within normal limits  COMPREHENSIVE METABOLIC PANEL - Abnormal; Notable for the following:    Potassium 3.4 (*)    Glucose, Bld 112 (*)    All other components within normal limits  PROTIME-INR  APTT  TROPONIN I  ETHANOL  URINE DRUG SCREEN, QUALITATIVE ([facility] ONLY)  CBG MONITORING, ED  TYPE AND SCREEN   ____________________________________________  EKG  ED ECG REPORT I, Filomena Pokorney, the attending physician, personally viewed and interpreted this ECG.  Date: 07/02/2016 EKG Time: 03:02 Rate: 64 Rhythm: normal sinus rhythm QRS Axis: normal Intervals: normal ST/T Wave abnormalities: Non-specific ST segment / T-wave changes, but no evidence of acute ischemia. Conduction Disturbances: none Narrative Interpretation: no evidence of acute ischemia  ____________________________________________  RADIOLOGY   Ct Head Code Stroke W/o Cm  Result Date: 07/02/2016 CLINICAL DATA:  Code stroke. 56 y/o M; right-sided facial numbness. EXAM: CT HEAD WITHOUT CONTRAST TECHNIQUE: Contiguous axial images were obtained from the base of the skull through the vertex without intravenous contrast. COMPARISON:  07/01/2010 CT of the head. 07/02/2010 MRI of the brain. FINDINGS: Brain: Lucency and left thalamus not present on prior study (series 3, image 14). Otherwise no evidence for large acute territory infarct, focal mass effect, or intracranial hemorrhage. No extra-axial collection or hydrocephalus. Vascular: No hyperdense vessel. Skull: Normal. Negative for fracture or focal lesion. Sinuses/Orbits: No acute finding. Other: None. ASPECTS [facility] Stroke Program Early CT Score) - Ganglionic level infarction (caudate, lentiform nuclei, internal capsule, insula, M1-M3 cortex): 7 - Supraganglionic infarction (M4-M6 cortex): 3 Total score (0-10 with 10 being normal): 10 IMPRESSION:  1. Lucency in the left thalamus not present on prior study may represent age-indeterminate lacunar infarct. This can be further characterized with MRI if clinically indicated. 2. No large territory infarct, focal mass effect, or intracranial hemorrhage. 3. ASPECTS is 10 These results were called by telephone at the time of interpretation on 07/02/2016 at 3:04 am to Dr. Karma Greaser, who verbally acknowledged these results. Electronically Signed   By: Edgardo Roys.D.  On: 07/02/2016 03:11    ____________________________________________   PROCEDURES  Procedure(s) performed:   .Critical Care Performed by: Hinda Kehr Authorized by: Hinda Kehr   Critical care provider statement:    Critical care time (minutes):  30   Critical care time was exclusive of:  Separately billable procedures and treating other patients   Critical care was necessary to treat or prevent imminent or life-threatening deterioration of the following conditions:  CNS failure or compromise   Critical care was time spent personally by me on the following activities:  Development of treatment plan with patient or surrogate, discussions with consultants, evaluation of patient's response to treatment, examination of patient, obtaining history from patient or surrogate, ordering and performing treatments and interventions, ordering and review of laboratory studies, ordering and review of radiographic studies, pulse oximetry, re-evaluation of patient's condition and review of old charts     Critical Care performed: Yes, see critical care procedure note(s) ____________________________________________   INITIAL IMPRESSION / [place] / ED COURSE  Pertinent labs & imaging results that were available during my care of the patient were reviewed by me and considered in my medical decision making (see chart for details).  Hx of 3 prior CVAs/TIAs, onset of symptoms about 1 hour ago.    NIH Stroke  Scale  Interval: Baseline Time: 3:06 AM Person Administering Scale: Fernandez Kenley  1a  Level of consciousness: 0=alert; keenly responsive  1b. LOC questions:  0=Performs both tasks correctly  1c. LOC commands: 0=Performs both tasks correctly  2.  Best Gaze: 0=normal  3.  Visual: 0=No visual loss  4. Facial Palsy: 1=Minor paralysis (flattened nasolabial fold, asymmetric on smiling)  5a.  Motor left arm: 0=No drift, limb holds 90 (or 45) degrees for full 10 seconds  5b.  Motor right arm: 0=No drift, limb holds 90 (or 45) degrees for full 10 seconds  6a. motor left leg: 0=No drift, limb holds 90 (or 45) degrees for full 10 seconds  6b  Motor right leg:  0=No drift, limb holds 90 (or 45) degrees for full 10 seconds  7. Limb Ataxia: 0=Absent  8.  Sensory: 0=Normal; no sensory loss  9. Best Language:  0=No aphasia, normal  10. Dysarthria: 0=Normal  11. Extinction and Inattention: 0=No abnormality  12. Distal motor function: 0=Normal   Total:   1   In my opinion he is not a candidate for tPA given minimal deficits, but we will proceed with workup.  Awaiting radiology report and teleneuro evaluation.     Clinical Course as of Jul 02 405  Sat Jul 02, 2016  0308 Received call from radiology.  Probable basal ganglia infarction (new since 2012, but otherwise age-indeterminate).  Proceeding with teleneuro.  [CF]  0406 I spoke by phone with the neurologist on call.  We reviewed all of the data available and she agrees with the NIH stroke scale of 1 and the fact that the patient is not a candidate for TPA given the minor symptoms.  However she agrees with the radiology called that this most likely represents an acute infarction in the left thalamus.  I am giving a full dose aspirin and admitting for full stroke workup.  [CF]  [id number] Updated patient and family.  [CF]    Clinical Course User Index [CF] Hinda Kehr, MD    ____________________________________________  FINAL CLINICAL  IMPRESSION(S) / ED DIAGNOSES  Final diagnoses:  Acute CVA (cerebrovascular accident) ([place])  Facial droop due to stroke  MEDICATIONS GIVEN DURING THIS VISIT:  Medications  aspirin chewable tablet 324 mg (324 mg Oral Given 07/02/16 0354)     NEW OUTPATIENT MEDICATIONS STARTED DURING THIS VISIT:  New Prescriptions   No medications on file    Modified Medications   No medications on file    Discontinued Medications   No medications on file     Note:  This document was prepared using Dragon voice recognition software and may include unintentional dictation errors.    Hinda Kehr, MD 07/02/16 [phone]

## 2016-07-02 NOTE — Care Management Note (Signed)
Case Management Note  Patient Details  Name: Jeffrey Stokes MRN: [id number] Date of Birth: 11/18/1960  Subjective/Objective:     Discussed home health PT with [name]'s wife who refused home health PT services. No other home health needs identified.                Action/Plan:   Expected Discharge Date:  07/03/16               Expected Discharge Plan:     In-House Referral:     Discharge planning Services     Post Acute Care Choice:    Choice offered to:     DME Arranged:    DME Agency:     HH Arranged:    HH Agency:     Status of Service:     If discussed at [employer], dates discussed:    Additional Comments:  Gwenyth Dingee A, RN 07/02/2016, 3:49 PM

## 2016-07-02 NOTE — Progress Notes (Signed)
MD order received to discharge pt home today; verbally reviewed AVS with pt, no questions voiced at this time; pt discharged via nursing to the visitor's entrance

## 2016-07-02 NOTE — Evaluation (Signed)
Physical Therapy Evaluation Patient Details Name: Jeffrey Stokes MRN: [id number] DOB: December 02, 1960 Today's Date: 07/02/2016   History of Present Illness  56 yo male with suspected stroke in thalamus, ischemic and age undetermined.  Previous strokes in 2007, now on light duty at work for 56 year old L shoulder painful rotator cuff tear.  PMHx:  HOH, TIA, anxiety, CVA,   Clinical Impression  Pt is demonstrating mainly difficulty with controlling standing balance and has reduced endurance so will follow acutely for this, progressing to HHPT.  Pt is likely to refuse therapy at home and will hopefully progress in pt to reduce fall risk if so.  Focus further therapy on LE strength and controlling balance on the hall with appropriate AD.    Follow Up Recommendations Home health PT;Supervision for mobility/OOB    Equipment Recommendations  None recommended by PT    Recommendations for Other Services       Precautions / Restrictions Precautions Precautions: Fall Restrictions Weight Bearing Restrictions: No Other Position/Activity Restrictions: CANNOT touch L shoulder without severe pain      Mobility  Bed Mobility Overal bed mobility: Needs Assistance Bed Mobility: Supine to Sit;Sit to Supine     Supine to sit: Min assist Sit to supine: Min assist   General bed mobility comments: help to transition to side of bed but pt is able to assist  Transfers Overall transfer level: Needs assistance Equipment used: Rolling walker (2 wheeled);1 person hand held assist Transfers: Sit to/from [facility] Sit to Stand: Min guard;Min assist Stand pivot transfers: Min guard       General transfer comment: used IV pole for support  Ambulation/Gait Ambulation/Gait assistance: Min guard;Min assist Ambulation Distance (Feet): 200 Feet Assistive device: 1 person hand held assist (I Vpole) Gait Pattern/deviations: Step-through pattern;Decreased stride length;Wide base of  support;Drifts right/left (Pt stops often over his R eye) Gait velocity: reduced Gait velocity interpretation: Below normal speed for age/gender    Stairs            Wheelchair Mobility    Modified Rankin (Stroke Patients Only)       Balance Overall balance assessment: Needs assistance Sitting-balance support: Feet supported Sitting balance-Leahy Scale: Good   Postural control: Posterior lean Standing balance support: Bilateral upper extremity supported Standing balance-Leahy Scale: Fair Standing balance comment: impacted by R eye symptoms                             Pertinent Vitals/Pain Pain Assessment: Faces Faces Pain Scale: Hurts even more Pain Location: R eye in light Pain Intervention(s): Limited activity within patient's tolerance;Monitored during session (talked to pt about using sunglasses to avoid exposure to lig)    Home Living Family/patient expects to be discharged to:: Private residence Living Arrangements: Spouse/significant other Available Help at Discharge: Family;Available 24 hours/day Type of Home: House Home Access: Stairs to enter Entrance Stairs-Rails: Right Entrance Stairs-Number of Steps: 3 Home Layout: One level Home Equipment: Walker - 2 wheels;Cane - single point      Prior Function Level of Independence: Independent               Hand Dominance        Extremity/Trunk Assessment   Upper Extremity Assessment Upper Extremity Assessment: Overall WFL for tasks assessed    Lower Extremity Assessment Lower Extremity Assessment: Overall WFL for tasks assessed    Cervical / Trunk Assessment Cervical / Trunk Assessment: Normal  Communication  Communication: No difficulties (chart states HOH but no issues during PT)  Cognition Arousal/Alertness: Awake/alert Behavior During Therapy: WFL for tasks assessed/performed Overall Cognitive Status: Within Functional Limits for tasks assessed                       General Comments      Exercises     Assessment/Plan    PT Assessment Patient needs continued PT services  PT Problem List Decreased strength;Decreased range of motion;Decreased activity tolerance;Decreased balance;Decreased mobility;Decreased coordination;Decreased knowledge of use of DME;Decreased safety awareness;Pain          PT Treatment Interventions DME instruction;Gait training;Stair training;Functional mobility training;Therapeutic activities;Therapeutic exercise;Balance training;Neuromuscular re-education;Patient/family education    PT Goals (Current goals can be found in the Care Plan section)  Acute Rehab PT Goals Patient Stated Goal: to go home with no further therapy PT Goal Formulation: With patient/family Time For Goal Achievement: 07/16/16 Potential to Achieve Goals: Good    Frequency Min 2X/week   Barriers to discharge   has sufficient help at home as he has been partially disabled with wife asisting since L shoulder injury    Co-evaluation               End of Session Equipment Utilized During Treatment: Gait belt Activity Tolerance: Patient tolerated treatment well;Treatment limited secondary to medical complications (Comment) (R eye sensitivity) Patient left: in bed;with call bell/phone within reach (sitting side of bed, asked him not to get up alone) Nurse Communication: Mobility status    Functional Assessment Tool Used: clinical judgment Functional Limitation: Mobility: Walking and moving around Mobility: Walking and Moving Around Current Status [id number]): At least 20 percent but less than 40 percent impaired, limited or restricted Mobility: Walking and Moving Around Goal Status [phone]): At least 1 percent but less than 20 percent impaired, limited or restricted    Time: [id number] PT Time Calculation (min) (ACUTE ONLY): 23 min   Charges:   PT Evaluation $PT Eval Low Complexity: 1 Procedure PT Treatments $Gait Training: 8-22 mins    PT G Codes:   PT G-Codes **NOT FOR INPATIENT CLASS** Functional Assessment Tool Used: clinical judgment Functional Limitation: Mobility: Walking and moving around Mobility: Walking and Moving Around Current Status [id number]): At least 20 percent but less than 40 percent impaired, limited or restricted Mobility: Walking and Moving Around Goal Status [phone]): At least 1 percent but less than 20 percent impaired, limited or restricted    Ramond Dial 07/02/2016, 12:54 PM   Mee Hives, PT MS Acute Rehab Dept. Number: [place] [facility] [place]

## 2016-07-02 NOTE — Consult Note (Signed)
Reason for Consult:F face tingling  Referring Physician: Dr. Posey Pronto   CC: R face numbness   HPI: Jeffrey Stokes is an 56 y.o. male with a known history of Transient ischemic attack, GERD, anxiety disorder, sleep apnea presented to the emergency room with numbness around the right eye and right facial area. Patient had the symptoms around 2 AM this morning. He got off work around 11:30 PM last night and went home. Patient came to the emergency room but is a numbness around the right eye in the right facial numbness have completely resolved. No weakness in any extremity. No complaints of any slurred speech. CTH showed ? LThalamic infarct     Past Medical History:  Diagnosis Date  . Allergy   . Anxiety   . Depression   . GERD (gastroesophageal reflux disease)   . History of motion sickness    laying on back  . HOH (hard of hearing)    bilateral  . Hx-TIA (transient ischemic attack) 2012  . Sleep apnea    had one sleep study, inconclusive,needs repeat  . Stroke [facility])    3 strokes last one on 2012/ mouth tilt/ weakness right hand side  . Wears dentures     Past Surgical History:  Procedure Laterality Date  . COLONOSCOPY WITH PROPOFOL N/A 06/01/2015   Procedure: COLONOSCOPY WITH PROPOFOL;  Surgeon: [name], MD;  Location: [facility];  Service: Endoscopy;  Laterality: N/A;  ASCENDING COLON POLYP DESCENDING COLON POLYP TRANSVERSE COLON POLYP  . EYE SURGERY     Age 52   . SALIVARY GLAND SURGERY    . SHOULDER ARTHROSCOPY Left 03/2014   bicep and nerve problems still  . TONSILLECTOMY    . UMBILICAL HERNIA REPAIR N/A 08/25/2015   Procedure: HERNIA REPAIR UMBILICAL ADULT;  Surgeon: Jules Husbands, MD;  Location: [facility];  Service: General;  Laterality: N/A;    Family History  Problem Relation Age of Onset  . Osteoarthritis Father   . Stroke Father     Social History:  reports that he quit smoking about 10 years ago. His smoking use included Cigarettes. He started smoking  about 45 years ago. He has a 66.00 pack-year smoking history. He has quit using smokeless tobacco. His smokeless tobacco use included Chew. He reports that he does not drink alcohol or use drugs.  Allergies  Allergen Reactions  . Hydrocodone Nausea Only and Anxiety  . Cymbalta [Duloxetine Hcl]     Constipation   . Lyrica [Pregabalin] Other (See Comments)    "drunk feeling"    Medications: I have reviewed the patient's current medications.  ROS: History obtained from the patient  General ROS: negative for - chills, fatigue, fever, night sweats, weight gain or weight loss Psychological ROS: negative for - behavioral disorder, hallucinations, memory difficulties, mood swings or suicidal ideation Ophthalmic ROS: negative for - blurry vision, double vision, eye pain or loss of vision ENT ROS: negative for - epistaxis, nasal discharge, oral lesions, sore throat, tinnitus or vertigo Allergy and Immunology ROS: negative for - hives or itchy/watery eyes Hematological and Lymphatic ROS: negative for - bleeding problems, bruising or swollen lymph nodes Endocrine ROS: negative for - galactorrhea, hair pattern changes, polydipsia/polyuria or temperature intolerance Respiratory ROS: negative for - cough, hemoptysis, shortness of breath or wheezing Cardiovascular ROS: negative for - chest pain, dyspnea on exertion, edema or irregular heartbeat Gastrointestinal ROS: negative for - abdominal pain, diarrhea, hematemesis, nausea/vomiting or stool incontinence Genito-Urinary ROS: negative for - dysuria,  hematuria, incontinence or urinary frequency/urgency Musculoskeletal ROS: negative for - joint swelling or muscular weakness Neurological ROS: as noted in HPI Dermatological ROS: negative for rash and skin lesion changes  Physical Examination: Blood pressure 122/71, pulse 65, temperature 97.4 F (36.3 C), temperature source Oral, resp. rate 20, height 6' (1.829 m), weight 88.5 kg (195 lb 1 oz), SpO2 99  %.    Neurological Examination Mental Status: Alert, oriented, thought content appropriate.  Speech fluent without evidence of aphasia.  Able to follow 3 step commands without difficulty. Cranial Nerves: II: Discs flat bilaterally; Visual fields grossly normal, pupils equal, round, reactive to light and accommodation III,IV, VI: ptosis not present, extra-ocular motions intact bilaterally V,VII: ? R facial droop  VIII: hearing normal bilaterally IX,X: gag reflex present XI: bilateral shoulder shrug XII: midline tongue extension Motor: Right : Upper extremity   5/5    Left:     Upper extremity   1/5  Lower extremity   5/5     Lower extremity   3/5 Tone and bulk:normal tone throughout; no atrophy noted Sensory: Pinprick and light touch intact throughout, bilaterally Deep Tendon Reflexes: 1+ and symmetric throughout Plantars: Right: downgoing   Left: downgoing Cerebellar: normal finger-to-nose, normal rapid alternating movements and normal heel-to-shin test Gait: not tested       Laboratory Studies:   Basic Metabolic Panel:  Recent Labs Lab 07/02/16 0301  NA 139  K 3.4*  CL 105  CO2 29  GLUCOSE 112*  BUN 14  CREATININE 1.05  CALCIUM 9.0    Liver Function Tests:  Recent Labs Lab 07/02/16 0301  AST 27  ALT 30  ALKPHOS 72  BILITOT 1.2  PROT 7.6  ALBUMIN 4.6   No results for input(s): LIPASE, AMYLASE in the last 168 hours. No results for input(s): AMMONIA in the last 168 hours.  CBC:  Recent Labs Lab 07/02/16 0301  WBC 11.3*  NEUTROABS 6.0  HGB 16.2  HCT 45.6  MCV 99.4  PLT 232    Cardiac Enzymes:  Recent Labs Lab 07/02/16 0301  TROPONINI <0.03    BNP: Invalid input(s): POCBNP  CBG: No results for input(s): GLUCAP in the last 168 hours.  Microbiology: No results found for this or any previous visit.  Coagulation Studies:  Recent Labs  07/02/16 0301  LABPROT 13.7  INR 1.05    Urinalysis: No results for input(s): COLORURINE,  LABSPEC, PHURINE, GLUCOSEU, HGBUR, BILIRUBINUR, KETONESUR, PROTEINUR, UROBILINOGEN, NITRITE, LEUKOCYTESUR in the last 168 hours.  Invalid input(s): APPERANCEUR  Lipid Panel:     Component Value Date/Time   CHOL 173 10/26/2015 1109   TRIG 77 10/26/2015 1109   HDL 54 10/26/2015 1109   CHOLHDL 3.2 10/26/2015 1109   LDLCALC 104 (H) 10/26/2015 1109    HgbA1C: No results found for: HGBA1C  Urine Drug Screen:     Component Value Date/Time   LABOPIA NONE DETECTED 07/02/2016 0905   COCAINSCRNUR NONE DETECTED 07/02/2016 0905   LABBENZ NONE DETECTED 07/02/2016 0905   AMPHETMU NONE DETECTED 07/02/2016 0905   THCU NONE DETECTED 07/02/2016 0905   LABBARB NONE DETECTED 07/02/2016 0905    Alcohol Level:  Recent Labs Lab 07/02/16 0302  ETH <5    Other results: EKG: normal EKG, normal sinus rhythm, unchanged from previous tracings.  Imaging: Ct Head Code Stroke W/o Cm  Result Date: 07/02/2016 CLINICAL DATA:  Code stroke. 56 y/o M; right-sided facial numbness. EXAM: CT HEAD WITHOUT CONTRAST TECHNIQUE: Contiguous axial images were obtained from the base  of the skull through the vertex without intravenous contrast. COMPARISON:  07/01/2010 CT of the head. 07/02/2010 MRI of the brain. FINDINGS: Brain: Lucency and left thalamus not present on prior study (series 3, image 14). Otherwise no evidence for large acute territory infarct, focal mass effect, or intracranial hemorrhage. No extra-axial collection or hydrocephalus. Vascular: No hyperdense vessel. Skull: Normal. Negative for fracture or focal lesion. Sinuses/Orbits: No acute finding. Other: None. ASPECTS [facility] Stroke Program Early CT Score) - Ganglionic level infarction (caudate, lentiform nuclei, internal capsule, insula, M1-M3 cortex): 7 - Supraganglionic infarction (M4-M6 cortex): 3 Total score (0-10 with 10 being normal): 10 IMPRESSION: 1. Lucency in the left thalamus not present on prior study may represent age-indeterminate lacunar  infarct. This can be further characterized with MRI if clinically indicated. 2. No large territory infarct, focal mass effect, or intracranial hemorrhage. 3. ASPECTS is 10 These results were called by telephone at the time of interpretation on 07/02/2016 at 3:04 am to Dr. Karma Greaser, who verbally acknowledged these results. Electronically Signed   By: Kristine Garbe M.D.   On: 07/02/2016 03:11     Assessment/Plan:   56 y.o. male with a known history of Transient ischemic attack, GERD, anxiety disorder, sleep apnea presented to the emergency room with numbness around the right eye and right facial area. Patient had the symptoms around 2 AM this morning. He got off work around 11:30 PM last night and went home. Patient came to the emergency room but is a numbness around the right eye in the right facial numbness have completely resolved. No weakness in any extremity. No complaints of any slurred speech. CTH showed ? LThalamic infarct   - Pt states his L side is chronically weak - On exam significant exacerbation of his symptoms including R facial droop and deviation of tongue to the  R which changes. - Will await MRI findings - anti platelet therapy - Pt/ot   07/02/2016, 10:58 AM

## 2016-07-02 NOTE — Discharge Summary (Signed)
[facility] [place] NAME: Jeffrey Stokes    MR#:  [id number]  DATE OF BIRTH:  1960/11/07  DATE OF ADMISSION:  07/02/2016 ADMITTING PHYSICIAN: Saundra Shelling, MD  DATE OF DISCHARGE: 07/02/16  PRIMARY CARE PHYSICIAN: Halina Maidens, MD    ADMISSION DIAGNOSIS:  Facial droop due to stroke [I69.392] Acute CVA (cerebrovascular accident) ([place]) [I63.9]  DISCHARGE DIAGNOSIS:  TIA  SECONDARY DIAGNOSIS:   Past Medical History:  Diagnosis Date  . Allergy   . Anxiety   . Depression   . GERD (gastroesophageal reflux disease)   . History of motion sickness    laying on back  . HOH (hard of hearing)    bilateral  . Hx-TIA (transient ischemic attack) 2012  . Sleep apnea    had one sleep study, inconclusive,needs repeat  . Stroke [facility])    3 strokes last one on 2012/ mouth tilt/ weakness right hand side  . Wears dentures     HOSPITAL COURSE:   56 year old male patient with history of TIA, GERD sleep apnea presented to the emergency room with right facial numbness .  1. right facial driop and numbness-resolved Appears TIA MRI/MRA negative -pt advised take daily asa -on omega FA-appreciate neuro input. Ok to go home since w/u neg otherwise  2. GERD -cont ppi  3. Sleep apnea -advised wieght loss  4. Hypokalemia Replete  No neuro symptoms Appears at baseline Requesting to go home Ambulated with PT Spoke with wife CONSULTS OBTAINED:  Treatment Team:  Leotis Pain, MD  DRUG ALLERGIES:   Allergies  Allergen Reactions  . Hydrocodone Nausea Only and Anxiety  . Cymbalta [Duloxetine Hcl]     Constipation   . Lyrica [Pregabalin] Other (See Comments)    "drunk feeling"    DISCHARGE MEDICATIONS:   Current Discharge Medication List    START taking these medications   Details  aspirin EC 325 MG EC tablet Take 1 tablet (325 mg total) by mouth daily. Qty: 30 tablet, Refills: 0      CONTINUE these medications which  have NOT CHANGED   Details  acetaminophen (TYLENOL) 325 MG tablet Take 650 mg by mouth every 6 (six) hours as needed.    amitriptyline (ELAVIL) 10 MG tablet Take 10 mg by mouth.    Ascorbic Acid (VITAMIN C PO) Take 1 tablet by mouth daily. Reported on 09/23/2015    cetirizine (ZYRTEC) 10 MG tablet Take 10 mg by mouth at bedtime.     Cholecalciferol (VITAMIN D PO) Take 1 tablet by mouth daily. Reported on 09/23/2015    Cyanocobalamin (B-12 PO) Take 1 tablet by mouth daily. Reported on 09/23/2015    DULoxetine (CYMBALTA) 60 MG capsule Take 60 mg by mouth daily.    fluticasone (FLONASE) 50 MCG/ACT nasal spray Place 2 sprays into both nostrils as needed. Reported on 09/23/2015    gabapentin (NEURONTIN) 100 MG capsule Take 1 capsule (100 mg total) by mouth at bedtime. Qty: 30 capsule, Refills: 2   Associated Diagnoses: Neuropathy (HCC)    Garlic 123XX123 MG CAPS Take 1,000 mg by mouth daily. Reported on 09/23/2015    lidocaine (LIDODERM) 5 % Apply patch to painful area. Patch may remain in place for up to 12 hours in a 24 hour period.    Magnesium 250 MG TABS Take 250 mg by mouth daily. Reported on 09/23/2015    Multiple Vitamins-Minerals (MULTIVITAMIN ADULT PO) Take 1 tablet by mouth daily. Reported on 09/23/2015  Omega 3 1000 MG CAPS Take 2,000 mg by mouth daily. Reported on 09/23/2015    POTASSIUM PO Take 595 mg by mouth daily. Reported on 09/23/2015    ranitidine (ZANTAC) 300 MG tablet Take 1 tablet (300 mg total) by mouth at bedtime. Qty: 30 tablet, Refills: 5   Associated Diagnoses: Gastroesophageal reflux disease, esophagitis presence not specified    valACYclovir (VALTREX) 1000 MG tablet TAKE 1 TABLET DAILY Qty: 90 tablet, Refills: 1        If you experience worsening of your admission symptoms, develop shortness of breath, life threatening emergency, suicidal or homicidal thoughts you must seek medical attention immediately by calling 911 or calling your MD immediately  if  symptoms less severe.  You Must read complete instructions/literature along with all the possible adverse reactions/side effects for all the Medicines you take and that have been prescribed to you. Take any new Medicines after you have completely understood and accept all the possible adverse reactions/side effects.   Please note  You were cared for by a hospitalist during your hospital stay. If you have any questions about your discharge medications or the care you received while you were in the hospital after you are discharged, you can call the unit and asked to speak with the hospitalist on call if the hospitalist that took care of you is not available. Once you are discharged, your primary care physician will handle any further medical issues. Please note that NO REFILLS for any discharge medications will be authorized once you are discharged, as it is imperative that you return to your primary care physician (or establish a relationship with a primary care physician if you do not have one) for your aftercare needs so that they can reassess your need for medications and monitor your lab values. Today   SUBJECTIVE   I am fine. Can I go home?  VITAL SIGNS:  Blood pressure 122/71, pulse 65, temperature 97.4 F (36.3 C), temperature source Oral, resp. rate 20, height 6' (1.829 m), weight 88.5 kg (195 lb 1 oz), SpO2 99 %.  I/O:   Intake/Output Summary (Last 24 hours) at 07/02/16 1446 Last data filed at 07/02/16 1413  Gross per 24 hour  Intake              480 ml  Output              350 ml  Net              130 ml    PHYSICAL EXAMINATION:  GENERAL:  56 y.o.-year-old patient lying in the bed with no acute distress.  EYES: Pupils equal, round, reactive to light and accommodation. No scleral icterus. Extraocular muscles intact.  HEENT: Head atraumatic, normocephalic. Oropharynx and nasopharynx clear.  NECK:  Supple, no jugular venous distention. No thyroid enlargement, no tenderness.   LUNGS: Normal breath sounds bilaterally, no wheezing, rales,rhonchi or crepitation. No use of accessory muscles of respiration.  CARDIOVASCULAR: S1, S2 normal. No murmurs, rubs, or gallops.  ABDOMEN: Soft, non-tender, non-distended. Bowel sounds present. No organomegaly or mass.  EXTREMITIES: No pedal edema, cyanosis, or clubbing.  NEUROLOGIC: Cranial nerves II through XII are intact. Muscle strength 5/5 in all extremities. Sensation intact. Gait not checked.  PSYCHIATRIC: The patient is alert and oriented x 3.  SKIN: No obvious rash, lesion, or ulcer.   DATA REVIEW:   CBC   Recent Labs Lab 07/02/16 0301  WBC 11.3*  HGB 16.2  HCT 45.6  PLT 232  Chemistries   Recent Labs Lab 07/02/16 0301  NA 139  K 3.4*  CL 105  CO2 29  GLUCOSE 112*  BUN 14  CREATININE 1.05  CALCIUM 9.0  AST 27  ALT 30  ALKPHOS 72  BILITOT 1.2    Microbiology Results   No results found for this or any previous visit (from the past 240 hour(s)).  RADIOLOGY:  Mr Brain Wo Contrast  Result Date: 07/02/2016 CLINICAL DATA:  Numbness about the right eye and face. EXAM: MRI HEAD WITHOUT CONTRAST MRA HEAD WITHOUT CONTRAST TECHNIQUE: Multiplanar, multiecho pulse sequences of the brain and surrounding structures were obtained without intravenous contrast. Angiographic images of the head were obtained using MRA technique without contrast. COMPARISON:  07/12/2010. FINDINGS: MRI HEAD FINDINGS Brain: No acute infarction, hemorrhage, hydrocephalus, extra-axial collection or mass lesion. No atrophy or white matter disease. No findings along the right trigeminal nerve or skullbase asymmetry. Vascular: Normal flow voids.  Arterial findings below. Skull and upper cervical spine: Negative Sinuses/Orbits: Negative MRA HEAD FINDINGS Symmetric carotid and vertebral arteries. No flow limiting stenosis or major branch occlusion. No beading or aneurysm. IMPRESSION: Negative MRI and MRA.  No infarct or other explanation for  symptoms. Electronically Signed   By: Monte Fantasia M.D.   On: 07/02/2016 13:49   US Carotid Bilateral (at [facility] Only)  Result Date: 07/02/2016 CLINICAL DATA:  TIA/stroke symptoms, right visual disturbance, remote tobacco use. EXAM: BILATERAL CAROTID DUPLEX ULTRASOUND TECHNIQUE: Pearline Cables scale imaging, color Doppler and duplex ultrasound were performed of bilateral carotid and vertebral arteries in the neck. COMPARISON:  07/02/2016 CT head FINDINGS: Criteria: Quantification of carotid stenosis is based on velocity parameters that correlate the residual internal carotid diameter with NASCET-based stenosis levels, using the diameter of the distal internal carotid lumen as the denominator for stenosis measurement. The following velocity measurements were obtained: RIGHT ICA:  94/21 cm/sec CCA:  0000000 cm/sec SYSTOLIC ICA/CCA RATIO:  0.9 DIASTOLIC ICA/CCA RATIO:  1.0 ECA:  145 cm/sec LEFT ICA:  113/25 cm/sec CCA:  [id number] cm/sec SYSTOLIC ICA/CCA RATIO:  1.1 DIASTOLIC ICA/CCA RATIO:  0.9 ECA:  102 cm/sec RIGHT CAROTID ARTERY: Minor intimal thickening and atherosclerotic plaque formation. No hemodynamically significant right ICA stenosis, velocity elevation, or turbulent flow. Degree of narrowing less than 50%. RIGHT VERTEBRAL ARTERY:  Antegrade LEFT CAROTID ARTERY: Similar scattered minor intimal thickening and atherosclerotic plaque formation. No hemodynamically significant left ICA stenosis, velocity elevation, or turbulent flow. LEFT VERTEBRAL ARTERY:  Antegrade IMPRESSION: Minor carotid atherosclerosis. No hemodynamically significant ICA stenosis. Degree of narrowing less than 50% bilaterally. Patent antegrade vertebral flow bilaterally Electronically Signed   By: Jerilynn Mages.  Shick M.D.   On: 07/02/2016 12:39   Mr Jodene Nam Head/brain [id number] Cm  Result Date: 07/02/2016 CLINICAL DATA:  Numbness about the right eye and face. EXAM: MRI HEAD WITHOUT CONTRAST MRA HEAD WITHOUT CONTRAST TECHNIQUE: Multiplanar, multiecho pulse  sequences of the brain and surrounding structures were obtained without intravenous contrast. Angiographic images of the head were obtained using MRA technique without contrast. COMPARISON:  07/12/2010. FINDINGS: MRI HEAD FINDINGS Brain: No acute infarction, hemorrhage, hydrocephalus, extra-axial collection or mass lesion. No atrophy or white matter disease. No findings along the right trigeminal nerve or skullbase asymmetry. Vascular: Normal flow voids.  Arterial findings below. Skull and upper cervical spine: Negative Sinuses/Orbits: Negative MRA HEAD FINDINGS Symmetric carotid and vertebral arteries. No flow limiting stenosis or major branch occlusion. No beading or aneurysm. IMPRESSION: Negative MRI and MRA.  No infarct or other explanation  for symptoms. Electronically Signed   By: Monte Fantasia M.D.   On: 07/02/2016 13:49   Ct Head Code Stroke W/o Cm  Result Date: 07/02/2016 CLINICAL DATA:  Code stroke. 56 y/o M; right-sided facial numbness. EXAM: CT HEAD WITHOUT CONTRAST TECHNIQUE: Contiguous axial images were obtained from the base of the skull through the vertex without intravenous contrast. COMPARISON:  07/01/2010 CT of the head. 07/02/2010 MRI of the brain. FINDINGS: Brain: Lucency and left thalamus not present on prior study (series 3, image 14). Otherwise no evidence for large acute territory infarct, focal mass effect, or intracranial hemorrhage. No extra-axial collection or hydrocephalus. Vascular: No hyperdense vessel. Skull: Normal. Negative for fracture or focal lesion. Sinuses/Orbits: No acute finding. Other: None. ASPECTS [facility] Stroke Program Early CT Score) - Ganglionic level infarction (caudate, lentiform nuclei, internal capsule, insula, M1-M3 cortex): 7 - Supraganglionic infarction (M4-M6 cortex): 3 Total score (0-10 with 10 being normal): 10 IMPRESSION: 1. Lucency in the left thalamus not present on prior study may represent age-indeterminate lacunar infarct. This can be further  characterized with MRI if clinically indicated. 2. No large territory infarct, focal mass effect, or intracranial hemorrhage. 3. ASPECTS is 10 These results were called by telephone at the time of interpretation on 07/02/2016 at 3:04 am to Dr. Karma Greaser, who verbally acknowledged these results. Electronically Signed   By: Kristine Garbe M.D.   On: 07/02/2016 03:11     Management plans discussed with the patient, family and they are in agreement.  CODE STATUS:     Code Status Orders        Start     Ordered   07/02/16 0730  Full code  Continuous     07/02/16 0729    Code Status History    Date Active Date Inactive Code Status Order ID Comments User Context   This patient has a current code status but no historical code status.      TOTAL TIME TAKING CARE OF THIS PATIENT: 40 minutes.    Jadien Lehigh M.D on 07/02/2016 at 2:46 PM  Between 7am to 6pm - Pager - [phone] After 6pm go to [email or web address] - password [email or web address] [facility]  Office  [phone]  CC: Primary care physician; Halina Maidens, MD

## 2016-07-03 NOTE — Evaluation (Signed)
Occupational Therapy Evaluation Patient Details Name: Jeffrey Stokes MRN: [id number] DOB: 06-11-1961 Today's Date: 07/03/2016    History of Present Illness 56 yo male with suspected stroke in thalamus, ischemic and age undetermined.  Previous strokes in 2007, now on light duty at work for 56 year old L shoulder painful rotator cuff tear.  PMHx:  HOH, TIA, anxiety, CVA,    Clinical Impression   Patient is a 56 year old male admitted for possible CVA with RUE weakness. Patient has history of several CVA with right side weakness in the past. Patient also has history of complete rotator cuff tear and nerve injury to LUE with limited use of LUE for past 2 years. Patient has previously seen OT for LUE, and states no change with LUE function at this time. Patient RUE and balance assessed. Patient RUE WFL. Patient has adapted ways for performing self care tasks due to LUE limitations. Able to describe adaptations, and perform pieces of task. Currently having difficulty with some self care in this setting due to unable to use adaptations (ex: using a urinal). Patient denies need for OT services at this time. Patient educated on how to request future OT if needs change.    Follow Up Recommendations  No OT follow up    Equipment Recommendations       Recommendations for Other Services       Precautions / Restrictions Precautions Precautions: Fall Restrictions Weight Bearing Restrictions: No Other Position/Activity Restrictions: CANNOT touch L shoulder without severe pain      Mobility Bed Mobility Overal bed mobility: Modified Independent Bed Mobility: Supine to Sit;Sit to Supine     Supine to sit: Min guard Sit to supine: Min guard   General bed mobility comments: Extra time and use of bed rails  Transfers                      Balance                                            ADL Overall ADL's : Modified independent                                              Vision     Perception     Praxis      Pertinent Vitals/Pain Pain Assessment: 0-10 Pain Score: 5  Pain Location: R eye in light Pain Descriptors / Indicators: Burning Pain Intervention(s): Limited activity within patient's tolerance;Monitored during session     Hand Dominance Right   Extremity/Trunk Assessment Upper Extremity Assessment Upper Extremity Assessment: LUE deficits/detail LUE Deficits / Details: Patient has previous injury +2 years of LUE rotator cuff and nerve injury. Patient has limited AROM in all of LUE. Uses left index and thumb to hold small, light items at times.   Lower Extremity Assessment Lower Extremity Assessment: Defer to PT evaluation   Cervical / Trunk Assessment Cervical / Trunk Assessment: Normal   Communication Communication Communication: No difficulties   Cognition Arousal/Alertness: Awake/alert Behavior During Therapy: WFL for tasks assessed/performed Overall Cognitive Status: Within Functional Limits for tasks assessed                     General Comments  Exercises       Shoulder Instructions      Home Living Family/patient expects to be discharged to:: Private residence Living Arrangements: Spouse/significant other Available Help at Discharge: Family;Available 24 hours/day Type of Home: House Home Access: Stairs to enter [employer] of Steps: 3 Entrance Stairs-Rails: Right Home Layout: One level         Bathroom Toilet: Standard     Home Equipment: Environmental consultant - 2 wheels;Cane - single point          Prior Functioning/Environment Level of Independence: Independent                 OT Problem List:     OT Treatment/Interventions:      OT Goals(Current goals can be found in the care plan section) Acute Rehab OT Goals Patient Stated Goal: to go home with no further therapy OT Goal Formulation: With patient Time For Goal Achievement: 07/16/16 Potential to  Achieve Goals: Good  OT Frequency:     Barriers to D/C:            Co-evaluation              End of Session Equipment Utilized During Treatment: Rolling walker  Activity Tolerance: Patient tolerated treatment well Patient left: in bed;with call bell/phone within reach;with bed alarm set;with nursing/sitter in room   Time: 1035-1055 OT Time Calculation (min): 20 min Charges:  OT General Charges $OT Visit: 1 Procedure OT Evaluation $OT Eval Low Complexity: 1 Procedure G-Codes: OT G-codes **NOT FOR INPATIENT CLASS** Functional Assessment Tool Used: Observation; clinical judgement Functional Limitation: Self care Self Care Current Status [id number]): At least 1 percent but less than 20 percent impaired, limited or restricted Self Care Goal Status [id number]): At least 1 percent but less than 20 percent impaired, limited or restricted Self Care Discharge Status [phone]): At least 1 percent but less than 20 percent impaired, limited or restricted  Cache Decoursey L 07/03/2016, 7:47 PM  Amie [place], OTR/L

## 2016-07-08 ENCOUNTER — Ambulatory Visit (INDEPENDENT_AMBULATORY_CARE_PROVIDER_SITE_OTHER): Payer: Commercial Managed Care - PPO | Admitting: Internal Medicine

## 2016-07-08 ENCOUNTER — Encounter: Payer: Self-pay | Admitting: Internal Medicine

## 2016-07-08 VITALS — BP 108/82 | HR 86 | Temp 97.6°F | Ht 72.0 in | Wt 197.0 lb

## 2016-07-08 DIAGNOSIS — G459 Transient cerebral ischemic attack, unspecified: Secondary | ICD-10-CM

## 2016-07-08 DIAGNOSIS — N481 Balanitis: Secondary | ICD-10-CM

## 2016-07-08 DIAGNOSIS — I639 Cerebral infarction, unspecified: Secondary | ICD-10-CM | POA: Diagnosis not present

## 2016-07-08 NOTE — Patient Instructions (Addendum)
Aspirin 325 mg once a day  Try Monistat twice a day for penile irritation.

## 2016-07-08 NOTE — Progress Notes (Signed)
Date:  07/08/2016   Name:  Jeffrey Stokes   DOB:  05-30-1961   MRN:  Jeffrey Stokes   Chief Complaint: Hospitalization Follow-up and Transient Ischemic Attack Eye Problem   The right eye is affected. The problem has been resolved. Pertinent negatives include no fever or weakness.  TIA - Admitted to Jeffrey Stokes for right eye droop and right facial tingling that lasted less than a day.  He was discharged on ASA 325 mg but has only been taking 81 mg. [place] - less than 50% stenosis bilaterally. MRI - no new stroke He states that the onset occurred when he developed a hemorrhage in his right sclera.  He also had a bad headache and then noticed right eye droop.  Right face droop was stable from previous stroke. Penile irritation - mild sx at the head of the penis.  No blisters or urethral discharge.    Review of Systems  Constitutional: Negative for chills, fatigue and fever.  HENT: Negative for drooling.   Eyes: Positive for visual disturbance (floaters).  Respiratory: Negative for chest tightness, shortness of breath and wheezing.   Cardiovascular: Negative for chest pain and leg swelling.  Gastrointestinal: Negative for abdominal pain.  Musculoskeletal: Negative for arthralgias and gait problem.  Neurological: Negative for dizziness, weakness, light-headedness, numbness and headaches.    Patient Active Problem List   Diagnosis Date Noted  . Ulnar neuritis, left 04/20/2016  . Complex regional pain syndrome type 2 of left upper extremity 02/12/2016  . Rhinitis, allergic 07/29/2015  . Benign neoplasm of ascending colon   . Benign neoplasm of transverse colon   . Benign neoplasm of descending colon   . Hemorrhoid 05/12/2015  . LPRD (laryngopharyngeal reflux disease) 03/31/2015  . Depression 03/31/2015  . Panic disorder without agoraphobia 03/31/2015  . Cerebral vascular accident (Jeffrey Stokes) 12/03/2014  . Herpes 12/03/2014  . Obstructive apnea 12/03/2014  . Disorder of rotator cuff 12/03/2014  .  Bilateral carotid artery disease (Jeffrey Stokes) 04/15/2014  . Knee stiffness 03/03/2011    Prior to Admission medications   Medication Sig Start Date End Date Taking? Authorizing Provider  acetaminophen (TYLENOL) 325 MG tablet Take 650 mg by mouth every 6 (six) hours as needed.   Yes Historical Provider, MD  amitriptyline (ELAVIL) 10 MG tablet Take 10 mg by mouth. 02/12/16 02/11/17 Yes Historical Provider, MD  Ascorbic Acid (VITAMIN C PO) Take 1 tablet by mouth daily. Reported on 09/23/2015   Yes Historical Provider, MD  aspirin EC 325 MG EC tablet Take 1 tablet (325 mg total) by mouth daily. 07/03/16  Yes Fritzi Mandes, MD  cetirizine (ZYRTEC) 10 MG tablet Take 10 mg by mouth at bedtime.    Yes Historical Provider, MD  Cholecalciferol (VITAMIN D PO) Take 1 tablet by mouth daily. Reported on 09/23/2015   Yes Historical Provider, MD  Cyanocobalamin (B-12 PO) Take 1 tablet by mouth daily. Reported on 09/23/2015   Yes Historical Provider, MD  DULoxetine (CYMBALTA) 60 MG capsule Take 60 mg by mouth daily.   Yes Historical Provider, MD  fluticasone (FLONASE) 50 MCG/ACT nasal spray Place 2 sprays into both nostrils as needed. Reported on 09/23/2015 12/25/14  Yes Historical Provider, MD  gabapentin (NEURONTIN) 100 MG capsule Take 1 capsule (100 mg total) by mouth at bedtime. 06/24/16  Yes Glean Hess, MD  Garlic [id number] MG CAPS Take 1,000 mg by mouth daily. Reported on 09/23/2015   Yes Historical Provider, MD  lidocaine (LIDODERM) 5 % Apply patch to painful area.  Patch may remain in place for up to 12 hours in a 24 hour period. 04/20/16  Yes Historical Provider, MD  Magnesium 250 MG TABS Take 250 mg by mouth daily. Reported on 09/23/2015   Yes Historical Provider, MD  Multiple Vitamins-Minerals (MULTIVITAMIN ADULT PO) Take 1 tablet by mouth daily. Reported on 09/23/2015   Yes Historical Provider, MD  Omega 3 1000 MG CAPS Take 2,000 mg by mouth daily. Reported on 09/23/2015   Yes Historical Provider, MD  POTASSIUM PO Take 595  mg by mouth daily. Reported on 09/23/2015   Yes Historical Provider, MD  ranitidine (ZANTAC) 300 MG tablet Take 1 tablet (300 mg total) by mouth at bedtime. 07/29/15  Yes Glean Hess, MD  valACYclovir (VALTREX) 1000 MG tablet TAKE 1 TABLET DAILY 04/22/16  Yes Glean Hess, MD    Allergies  Allergen Reactions  . Hydrocodone Nausea Only and Anxiety  . Cymbalta [Duloxetine Hcl]     Constipation   . Lyrica [Pregabalin] Other (See Comments)    "drunk feeling"    Past Surgical History:  Procedure Laterality Date  . COLONOSCOPY WITH PROPOFOL N/A 06/01/2015   Procedure: COLONOSCOPY WITH PROPOFOL;  Surgeon: [name], MD;  Location: [facility];  Service: Endoscopy;  Laterality: N/A;  ASCENDING COLON POLYP DESCENDING COLON POLYP TRANSVERSE COLON POLYP  . EYE SURGERY     Age 49   . SALIVARY GLAND SURGERY    . SHOULDER ARTHROSCOPY Left 03/2014   bicep and nerve problems still  . TONSILLECTOMY    . UMBILICAL HERNIA REPAIR N/A 08/25/2015   Procedure: HERNIA REPAIR UMBILICAL ADULT;  Surgeon: Jules Husbands, MD;  Location: [facility];  Service: General;  Laterality: N/A;    Social History  Substance Use Topics  . Smoking status: Former Smoker    Packs/day: 2.00    Years: 33.00    Types: Cigarettes    Start date: 06/28/1971    Quit date: 12/25/2005  . Smokeless tobacco: Former Systems developer    Types: Chew  . Alcohol use No     Comment: Quit a long time ago     Medication list has been reviewed and updated.   Physical Exam  Constitutional: He is oriented to person, place, and time. He appears well-developed. No distress.  HENT:  Head: Normocephalic and atraumatic.  Eyes: EOM are normal. Pupils are equal, round, and reactive to light.  Neck: Normal range of motion. Neck supple.  Cardiovascular: Normal rate, regular rhythm and normal heart sounds.   Pulmonary/Chest: Effort normal and breath sounds normal. No respiratory distress. He has no wheezes.  Musculoskeletal: He exhibits no  edema.  Neurological: He is alert and oriented to person, place, and time. He has normal strength and normal reflexes. No sensory deficit.  Skin: Skin is warm and dry. No rash noted.  Psychiatric: He has a normal mood and affect. His speech is normal and behavior is normal. Thought content normal.  Nursing note and vitals reviewed.   BP 108/82   Pulse 86   Temp 97.6 F (36.4 C)   Ht 6' (1.829 m)   Wt 197 lb (89.4 kg)   SpO2 97%   BMI 26.72 kg/m   Assessment and Plan: 1. Transient cerebral ischemia, unspecified type Begin full dose aspirin 325 mg daily Lab Results  Component Value Date   CHOL 173 10/26/2015   HDL 54 10/26/2015   LDLCALC 104 (H) 10/26/2015   TRIG 77 10/26/2015   CHOLHDL 3.2 10/26/2015  2. Cerebrovascular accident (CVA), unspecified mechanism ([place]) resolved  3. Balanitis Likely yeast - try otc Monistat bid   Halina Maidens, MD [facility]  07/08/2016

## 2016-08-25 ENCOUNTER — Ambulatory Visit (INDEPENDENT_AMBULATORY_CARE_PROVIDER_SITE_OTHER): Payer: Commercial Managed Care - PPO | Admitting: Internal Medicine

## 2016-08-25 ENCOUNTER — Ambulatory Visit
Admission: RE | Admit: 2016-08-25 | Discharge: 2016-08-25 | Disposition: A | Discharge status: Home / Self Care | Payer: Commercial Managed Care - PPO | Source: Ambulatory Visit | Attending: Internal Medicine | Admitting: Internal Medicine

## 2016-08-25 ENCOUNTER — Encounter: Payer: Self-pay | Admitting: Internal Medicine

## 2016-08-25 VITALS — BP 112/82 | HR 68 | Temp 97.5°F | Ht 72.0 in | Wt 199.0 lb

## 2016-08-25 DIAGNOSIS — J40 Bronchitis, not specified as acute or chronic: Secondary | ICD-10-CM | POA: Insufficient documentation

## 2016-08-25 MED ORDER — LEVOFLOXACIN 500 MG PO TABS: 500.0000 mg | ORAL_TABLET | Freq: Every day | ORAL | 0 refills | Status: DC

## 2016-08-25 NOTE — Patient Instructions (Signed)
Advair 100/50 - one inhalation twice a day - rinse mouth afterwards.

## 2016-08-25 NOTE — Progress Notes (Signed)
Date:  08/25/2016   Name:  Jeffrey Stokes   DOB:  [birth year]   MRN:  [id number]   Chief Complaint: Shortness of Breath (X 6 weeks. Worse at night while laying down. Cough- gotten better but won't go away. Loss of energy. Pt has had pneumonia 4 times and symptoms "feels the same as before") Shortness of Breath  This is a new problem. The current episode started 1 to 4 weeks ago. The problem occurs constantly. The problem has been gradually worsening. Associated symptoms include chest pain and orthopnea. Pertinent negatives include no fever, headaches, hemoptysis, leg swelling, rash, sputum production, vomiting or wheezing. The symptoms are aggravated by exercise.     Review of Systems  Constitutional: Positive for fatigue. Negative for chills and fever.  Eyes: Negative for visual disturbance.  Respiratory: Positive for cough, chest tightness and shortness of breath. Negative for hemoptysis, sputum production and wheezing.   Cardiovascular: Positive for chest pain and orthopnea. Negative for palpitations and leg swelling.  Gastrointestinal: Negative for diarrhea and vomiting.  Genitourinary: Negative for difficulty urinating and hematuria.  Musculoskeletal: Positive for arthralgias (CRPS of left shoulder).  Skin: Negative for color change and rash.  Neurological: Negative for dizziness, numbness and headaches.    Patient Active Problem List   Diagnosis Date Noted  . Ulnar neuritis, left 04/20/2016  . Complex regional pain syndrome type 2 of left upper extremity 02/12/2016  . Rhinitis, allergic 07/29/2015  . Benign neoplasm of ascending colon   . Benign neoplasm of transverse colon   . Benign neoplasm of descending colon   . Hemorrhoid 05/12/2015  . LPRD (laryngopharyngeal reflux disease) 03/31/2015  . Depression 03/31/2015  . Panic disorder without agoraphobia 03/31/2015  . Cerebral vascular accident ([place]) 12/03/2014  . Herpes 12/03/2014  . Obstructive apnea 12/03/2014  .  Disorder of rotator cuff 12/03/2014  . Bilateral carotid artery disease ([place]) 04/15/2014  . Knee stiffness 03/03/2011    Prior to Admission medications   Medication Sig Start Date End Date Taking? Authorizing Provider  acetaminophen (TYLENOL) 325 MG tablet Take 650 mg by mouth every 6 (six) hours as needed.   Yes Historical Provider, MD  amitriptyline (ELAVIL) 10 MG tablet Take 10 mg by mouth. 02/12/16 02/11/17 Yes Historical Provider, MD  Ascorbic Acid (VITAMIN C PO) Take 1 tablet by mouth daily. Reported on 09/23/2015   Yes Historical Provider, MD  aspirin EC 325 MG EC tablet Take 1 tablet (325 mg total) by mouth daily. 07/03/16  Yes Fritzi Mandes, MD  cetirizine (ZYRTEC) 10 MG tablet Take 10 mg by mouth at bedtime.    Yes Historical Provider, MD  Cholecalciferol (VITAMIN D PO) Take 1 tablet by mouth daily. Reported on 09/23/2015   Yes Historical Provider, MD  Cyanocobalamin (B-12 PO) Take 1 tablet by mouth daily. Reported on 09/23/2015   Yes Historical Provider, MD  DULoxetine (CYMBALTA) 60 MG capsule Take 60 mg by mouth daily.   Yes Historical Provider, MD  fluticasone (FLONASE) 50 MCG/ACT nasal spray Place 2 sprays into both nostrils as needed. Reported on 09/23/2015 12/25/14  Yes Historical Provider, MD  gabapentin (NEURONTIN) 100 MG capsule Take 1 capsule (100 mg total) by mouth at bedtime. 06/24/16  Yes Glean Hess, MD  Garlic [id number] MG CAPS Take 1,000 mg by mouth daily. Reported on 09/23/2015   Yes Historical Provider, MD  lidocaine (LIDODERM) 5 % Apply patch to painful area. Patch may remain in place for up to 12 hours in a 24  hour period. 04/20/16  Yes Historical Provider, MD  Magnesium 250 MG TABS Take 250 mg by mouth daily. Reported on 09/23/2015   Yes Historical Provider, MD  Multiple Vitamins-Minerals (MULTIVITAMIN ADULT PO) Take 1 tablet by mouth daily. Reported on 09/23/2015   Yes Historical Provider, MD  Omega 3 1000 MG CAPS Take 2,000 mg by mouth daily. Reported on 09/23/2015   Yes  Historical Provider, MD  POTASSIUM PO Take 595 mg by mouth daily. Reported on 09/23/2015   Yes Historical Provider, MD  ranitidine (ZANTAC) 300 MG tablet Take 1 tablet (300 mg total) by mouth at bedtime. 07/29/15  Yes Glean Hess, MD  valACYclovir (VALTREX) 1000 MG tablet TAKE 1 TABLET DAILY 04/22/16  Yes Glean Hess, MD    Allergies  Allergen Reactions  . Hydrocodone Nausea Only and Anxiety  . Cymbalta [Duloxetine Hcl]     Constipation   . Lyrica [Pregabalin] Other (See Comments)    "drunk feeling"    Past Surgical History:  Procedure Laterality Date  . COLONOSCOPY WITH PROPOFOL N/A 06/01/2015   Procedure: COLONOSCOPY WITH PROPOFOL;  Surgeon: [name], MD;  Location: [facility];  Service: Endoscopy;  Laterality: N/A;  ASCENDING COLON POLYP DESCENDING COLON POLYP TRANSVERSE COLON POLYP  . EYE SURGERY     Age 49   . SALIVARY GLAND SURGERY    . SHOULDER ARTHROSCOPY Left 03/2014   bicep and nerve problems still  . TONSILLECTOMY    . UMBILICAL HERNIA REPAIR N/A 08/25/2015   Procedure: HERNIA REPAIR UMBILICAL ADULT;  Surgeon: Jules Husbands, MD;  Location: [facility];  Service: General;  Laterality: N/A;    Social History  Substance Use Topics  . Smoking status: Former Smoker    Packs/day: 2.00    Years: 33.00    Types: Cigarettes    Start date: 06/28/1971    Quit date: 12/25/2005  . Smokeless tobacco: Former Systems developer    Types: Chew  . Alcohol use No     Comment: Quit a long time ago     Medication list has been reviewed and updated.   Physical Exam  Constitutional: He is oriented to person, place, and time. He appears well-developed. No distress.  HENT:  Head: Normocephalic and atraumatic.  Neck: Normal range of motion.  Cardiovascular: Normal rate, regular rhythm and normal heart sounds.   Pulmonary/Chest: Effort normal. No respiratory distress. He has no wheezes. He has no rales.  Musculoskeletal: Normal range of motion.       Left shoulder: He exhibits  tenderness (exquisitely tender).  Lymphadenopathy:    He has no cervical adenopathy.  Neurological: He is alert and oriented to person, place, and time.  Skin: Skin is warm and dry. No rash noted.  Psychiatric: He has a normal mood and affect. His speech is normal and behavior is normal. Thought content normal.  Nursing note and vitals reviewed.   BP 112/82   Pulse 68   Temp 97.5 F (36.4 C)   Ht 6' (1.829 m)   Wt 199 lb (90.3 kg)   SpO2 96%   BMI 26.99 kg/m   Assessment and Plan: 1. Bronchitis With hx smoking Begin Advair 100/50 one puff bid (sample) - DG Chest 2 View; Future - CBC with Differential/Platelet   Meds ordered this encounter  Medications  . levofloxacin (LEVAQUIN) 500 MG tablet    Sig: Take 1 tablet (500 mg total) by mouth daily.    Dispense:  7 tablet    Refill:  [place], MD [facility]  08/25/2016

## 2016-08-26 LAB — CBC WITH DIFFERENTIAL/PLATELET
Basophils Absolute: 0 10*3/uL (ref 0.0–0.2)
Basos: 0 %
EOS (ABSOLUTE): 0.2 10*3/uL (ref 0.0–0.4)
EOS: 2 %
HEMATOCRIT: 46.9 % (ref 37.5–51.0)
HEMOGLOBIN: 16.1 g/dL (ref 13.0–17.7)
Immature Grans (Abs): 0 10*3/uL (ref 0.0–0.1)
Immature Granulocytes: 0 %
LYMPHS ABS: 3.1 10*3/uL (ref 0.7–3.1)
Lymphs: 34 %
MCH: 34.6 pg — AB (ref 26.6–33.0)
MCHC: 34.3 g/dL (ref 31.5–35.7)
MCV: 101 fL — ABNORMAL HIGH (ref 79–97)
MONOCYTES: 13 %
MONOS ABS: 1.2 10*3/uL — AB (ref 0.1–0.9)
NEUTROS ABS: 4.7 10*3/uL (ref 1.4–7.0)
Neutrophils: 51 %
Platelets: 267 10*3/uL (ref 150–379)
RBC: 4.65 x10E6/uL (ref 4.14–5.80)
RDW: 14 % (ref 12.3–15.4)
WBC: 9.1 10*3/uL (ref 3.4–10.8)

## 2016-10-05 ENCOUNTER — Other Ambulatory Visit: Payer: Self-pay | Admitting: Internal Medicine

## 2017-03-14 ENCOUNTER — Ambulatory Visit (INDEPENDENT_AMBULATORY_CARE_PROVIDER_SITE_OTHER): Payer: Commercial Managed Care - PPO | Admitting: Internal Medicine

## 2017-03-14 ENCOUNTER — Encounter: Payer: Self-pay | Admitting: Internal Medicine

## 2017-03-14 VITALS — BP 112/68 | HR 61 | Ht 72.0 in | Wt 200.6 lb

## 2017-03-14 DIAGNOSIS — F325 Major depressive disorder, single episode, in full remission: Secondary | ICD-10-CM | POA: Insufficient documentation

## 2017-03-14 DIAGNOSIS — K219 Gastro-esophageal reflux disease without esophagitis: Secondary | ICD-10-CM | POA: Diagnosis not present

## 2017-03-14 DIAGNOSIS — F324 Major depressive disorder, single episode, in partial remission: Secondary | ICD-10-CM | POA: Diagnosis not present

## 2017-03-14 DIAGNOSIS — Z23 Encounter for immunization: Secondary | ICD-10-CM | POA: Diagnosis not present

## 2017-03-14 DIAGNOSIS — G5642 Causalgia of left upper limb: Secondary | ICD-10-CM | POA: Diagnosis not present

## 2017-03-14 MED ORDER — DULOXETINE HCL 60 MG PO CPEP: 60.0000 mg | ORAL_CAPSULE | Freq: Every day | ORAL | 5 refills | Status: DC

## 2017-03-14 MED ORDER — VALACYCLOVIR HCL 1 G PO TABS: 1000.0000 mg | ORAL_TABLET | Freq: Every day | ORAL | 1 refills | Status: DC

## 2017-03-14 MED ORDER — RANITIDINE HCL 300 MG PO TABS: 300.0000 mg | ORAL_TABLET | Freq: Two times a day (BID) | ORAL | 5 refills | Status: DC

## 2017-03-14 NOTE — Progress Notes (Signed)
Date:  03/14/2017   Name:  Jeffrey Stokes   DOB:  08/18/1960   MRN:  [id number]   Chief Complaint: Immunizations (Need flu shot. ); Depression (PHQ9- 9. Needs refill on Cymbalta. ); and Gastroesophageal Reflux (Needs refill on Zantac)  Depression         This is a chronic problem.  The problem has been waxing and waning since onset.  Associated symptoms include decreased interest.  Associated symptoms include no fatigue and no suicidal ideas.     The symptoms are aggravated by work stress (having to retire due to arm disability).  Past treatments include SNRIs - Serotonin and norepinephrine reuptake inhibitors.  Compliance with treatment is good.  Past medical history includes physical disability.   Gastroesophageal Reflux  He complains of heartburn. He reports no abdominal pain or no chest pain. This is a recurrent problem. The problem occurs occasionally. Pertinent negatives include no fatigue. He has tried a histamine-2 antagonist for the symptoms. The treatment provided significant relief.  He has to resign his job.  He is trying now to get disability.  He needs refills, labs and flu shot. He continues to take gabapentin intermittently when the pain is worse.   Review of Systems  Constitutional: Negative for chills, fatigue and fever.  Respiratory: Negative for chest tightness and shortness of breath.   Cardiovascular: Negative for chest pain and palpitations.  Gastrointestinal: Positive for heartburn. Negative for abdominal pain and blood in stool.  Genitourinary: Negative for difficulty urinating.  Musculoskeletal: Positive for arthralgias (both shoulders chronic rotator cuff tear).  Allergic/Immunologic: Negative for environmental allergies.  Psychiatric/Behavioral: Positive for depression and dysphoric mood. Negative for sleep disturbance and suicidal ideas. The patient is nervous/anxious.     Patient Active Problem List   Diagnosis Date Noted  . Major depressive disorder with  single episode, in partial remission ([place]) 03/14/2017  . Gastroesophageal reflux disease 03/14/2017  . Ulnar neuritis, left 04/20/2016  . Complex regional pain syndrome type 2 of left upper extremity 02/12/2016  . Rhinitis, allergic 07/29/2015  . Benign neoplasm of ascending colon   . Benign neoplasm of transverse colon   . Benign neoplasm of descending colon   . Hemorrhoid 05/12/2015  . LPRD (laryngopharyngeal reflux disease) 03/31/2015  . Panic disorder without agoraphobia 03/31/2015  . Cerebral vascular accident ([place]) 12/03/2014  . Herpes 12/03/2014  . Obstructive apnea 12/03/2014  . Disorder of rotator cuff 12/03/2014  . Bilateral carotid artery disease ([place]) 04/15/2014  . Knee stiffness 03/03/2011    Prior to Admission medications   Medication Sig Start Date End Date Taking? Authorizing Provider  acetaminophen (TYLENOL) 325 MG tablet Take 650 mg by mouth every 6 (six) hours as needed.   Yes [provider]  Ascorbic Acid (VITAMIN C PO) Take 1 tablet by mouth daily. Reported on 09/23/2015   Yes [provider]  aspirin EC 325 MG EC tablet Take 1 tablet (325 mg total) by mouth daily. 07/03/16  Yes Fritzi Mandes, MD  cetirizine (ZYRTEC) 10 MG tablet Take 10 mg by mouth at bedtime.    Yes [provider]  Cholecalciferol (VITAMIN D PO) Take 1 tablet by mouth daily. Reported on 09/23/2015   Yes [provider]  Cyanocobalamin (B-12 PO) Take 1 tablet by mouth daily. Reported on 09/23/2015   Yes [provider]  DULoxetine (CYMBALTA) 60 MG capsule Take 60 mg by mouth daily.   Yes [provider]  fluticasone (FLONASE) 50 MCG/ACT nasal spray Place  2 sprays into both nostrils as needed. Reported on 09/23/2015 12/25/14  Yes [provider]  gabapentin (NEURONTIN) 100 MG capsule Take 1 capsule (100 mg total) by mouth at bedtime. 06/24/16  Yes Glean Hess, MD  Garlic 3825 MG CAPS Take 1,000 mg by mouth daily. Reported on 09/23/2015    Yes [provider]  lidocaine (LIDODERM) 5 % Apply patch to painful area. Patch may remain in place for up to 12 hours in a 24 hour period. 04/20/16  Yes [provider]  Magnesium 250 MG TABS Take 250 mg by mouth daily. Reported on 09/23/2015   Yes [provider]  Multiple Vitamins-Minerals (MULTIVITAMIN ADULT PO) Take 1 tablet by mouth daily. Reported on 09/23/2015   Yes [provider]  Omega 3 1000 MG CAPS Take 2,000 mg by mouth daily. Reported on 09/23/2015   Yes [provider]  POTASSIUM PO Take 595 mg by mouth daily. Reported on 09/23/2015   Yes [provider]  ranitidine (ZANTAC) 300 MG tablet Take 1 tablet (300 mg total) by mouth at bedtime. 07/29/15  Yes Glean Hess, MD  valACYclovir (VALTREX) 1000 MG tablet TAKE 1 TABLET DAILY 10/05/16  Yes Glean Hess, MD  amitriptyline (ELAVIL) 10 MG tablet Take 10 mg by mouth. 02/12/16 02/11/17  [provider]    Allergies  Allergen Reactions  . Hydrocodone Nausea Only and Anxiety  . Lyrica [Pregabalin] Other (See Comments)    "drunk feeling"    Past Surgical History:  Procedure Laterality Date  . COLONOSCOPY WITH PROPOFOL N/A 06/01/2015   Procedure: COLONOSCOPY WITH PROPOFOL;  Surgeon: [name], MD;  Location: [facility];  Service: Endoscopy;  Laterality: N/A;  ASCENDING COLON POLYP DESCENDING COLON POLYP TRANSVERSE COLON POLYP  . EYE SURGERY     Age 56   . SALIVARY GLAND SURGERY    . SHOULDER ARTHROSCOPY Left 03/2014   bicep and nerve problems still  . TONSILLECTOMY    . UMBILICAL HERNIA REPAIR N/A 08/25/2015   Procedure: HERNIA REPAIR UMBILICAL ADULT;  Surgeon: Jules Husbands, MD;  Location: [facility];  Service: General;  Laterality: N/A;    Social History  Substance Use Topics  . Smoking status: Former Smoker    Packs/day: 2.00    Years: 33.00    Types: Cigarettes    Start date: 06/28/1971    Quit date: 12/25/2005  . Smokeless tobacco: Former Systems developer     Types: Chew  . Alcohol use No     Medication list has been reviewed and updated.  PHQ 2/9 Scores 03/14/2017 10/26/2015 07/29/2015  PHQ - 2 Score 3 6 0  PHQ- 9 Score 9 19 -    Physical Exam  Constitutional: He is oriented to person, place, and time. He appears well-developed. No distress.  HENT:  Head: Normocephalic and atraumatic.  Neck: Normal range of motion. Neck supple. Carotid bruit is not present. No thyromegaly present.  Cardiovascular: Normal rate, regular rhythm and normal heart sounds.   Pulmonary/Chest: Effort normal and breath sounds normal. No respiratory distress. He has no wheezes.  Musculoskeletal: He exhibits no edema.       Right shoulder: He exhibits decreased range of motion and tenderness.       Left shoulder: He exhibits decreased range of motion and tenderness.  Neurological: He is alert and oriented to person, place, and time.  Skin: Skin is warm and dry. No rash noted.  Psychiatric: He has a normal mood and affect. His  speech is normal and behavior is normal. Judgment and thought content normal. Cognition and memory are normal.  Nursing note and vitals reviewed.   BP 112/68   Pulse 61   Ht 6' (1.829 m)   Wt 200 lb 9.6 oz (91 kg)   SpO2 95%   BMI 27.21 kg/m   Assessment and Plan: 1. Major depressive disorder with single episode, in partial remission (HCC) Continue medications - DULoxetine (CYMBALTA) 60 MG capsule; Take 1 capsule (60 mg total) by mouth daily.  Dispense: 30 capsule; Refill: 5  2. Complex regional pain syndrome type 2 of left upper extremity Stable, now pursuing disability - Comprehensive metabolic panel  3. Need for influenza vaccination - Flu Vaccine QUAD 36+ mos IM  4. Gastroesophageal reflux disease, esophagitis presence not specified Continue medications - ranitidine (ZANTAC) 300 MG tablet; Take 1 tablet (300 mg total) by mouth 2 (two) times daily.  Dispense: 60 tablet; Refill: 5 - CBC with Differential/Platelet  5. LPRD  (laryngopharyngeal reflux disease) stable - ranitidine (ZANTAC) 300 MG tablet; Take 1 tablet (300 mg total) by mouth 2 (two) times daily.  Dispense: 60 tablet; Refill: 5   Meds ordered this encounter  Medications  . DULoxetine (CYMBALTA) 60 MG capsule    Sig: Take 1 capsule (60 mg total) by mouth daily.    Dispense:  30 capsule    Refill:  5  . ranitidine (ZANTAC) 300 MG tablet    Sig: Take 1 tablet (300 mg total) by mouth 2 (two) times daily.    Dispense:  60 tablet    Refill:  5  . valACYclovir (VALTREX) 1000 MG tablet    Sig: Take 1 tablet (1,000 mg total) by mouth daily.    Dispense:  90 tablet    Refill:  1    Partially dictated using Editor, commissioning. Any errors are unintentional.  Halina Maidens, MD [facility]  03/14/2017

## 2017-03-15 LAB — CBC WITH DIFFERENTIAL/PLATELET
BASOS: 0 %
Basophils Absolute: 0 10*3/uL (ref 0.0–0.2)
EOS (ABSOLUTE): 0.1 10*3/uL (ref 0.0–0.4)
EOS: 1 %
HEMATOCRIT: 46.1 % (ref 37.5–51.0)
HEMOGLOBIN: 16.1 g/dL (ref 13.0–17.7)
IMMATURE GRANS (ABS): 0 10*3/uL (ref 0.0–0.1)
Immature Granulocytes: 1 %
LYMPHS ABS: 2.3 10*3/uL (ref 0.7–3.1)
Lymphs: 31 %
MCH: 36.3 pg — AB (ref 26.6–33.0)
MCHC: 34.9 g/dL (ref 31.5–35.7)
MCV: 104 fL — ABNORMAL HIGH (ref 79–97)
Monocytes Absolute: 1 10*3/uL — ABNORMAL HIGH (ref 0.1–0.9)
Monocytes: 13 %
NEUTROS ABS: 4.1 10*3/uL (ref 1.4–7.0)
Neutrophils: 54 %
Platelets: 244 10*3/uL (ref 150–379)
RBC: 4.44 x10E6/uL (ref 4.14–5.80)
RDW: 13.7 % (ref 12.3–15.4)
WBC: 7.5 10*3/uL (ref 3.4–10.8)

## 2017-03-15 LAB — COMPREHENSIVE METABOLIC PANEL
ALBUMIN: 4.4 g/dL (ref 3.5–5.5)
ALT: 30 IU/L (ref 0–44)
AST: 25 IU/L (ref 0–40)
Albumin/Globulin Ratio: 2 (ref 1.2–2.2)
Alkaline Phosphatase: 79 IU/L (ref 39–117)
BILIRUBIN TOTAL: 0.4 mg/dL (ref 0.0–1.2)
BUN/Creatinine Ratio: 11 (ref 9–20)
BUN: 11 mg/dL (ref 6–24)
CHLORIDE: 104 mmol/L (ref 96–106)
CO2: 23 mmol/L (ref 20–29)
Calcium: 9.4 mg/dL (ref 8.7–10.2)
Creatinine, Ser: 0.97 mg/dL (ref 0.76–1.27)
GFR calc Af Amer: 103 mL/min/{1.73_m2} (ref >59)
GFR, EST NON AFRICAN AMERICAN: 89 mL/min/{1.73_m2} (ref >59)
GLOBULIN, TOTAL: 2.2 g/dL (ref 1.5–4.5)
Glucose: 92 mg/dL (ref 65–99)
POTASSIUM: 4.1 mmol/L (ref 3.5–5.2)
Sodium: 144 mmol/L (ref 134–144)
TOTAL PROTEIN: 6.6 g/dL (ref 6.0–8.5)

## 2017-04-03 ENCOUNTER — Other Ambulatory Visit: Payer: Self-pay | Admitting: Internal Medicine

## 2018-02-03 IMAGING — MR MR MRA HEAD W/O CM
11 series · 42 of 48 positions shown · non-contrast
Comparison: 07/12/2010.

CLINICAL DATA: Numbness about the right eye and face.

EXAM:
MRI HEAD WITHOUT CONTRAST
MRA HEAD WITHOUT CONTRAST
TECHNIQUE: Multiplanar, multiecho pulse sequences of the brain and surrounding
structures were obtained without intravenous contrast. Angiographic
images of the head were obtained using MRA technique without
contrast.

[Series 2: GRE · sagittal · 5.0mm · 0.45mm/px · 3 of 23 slices shown]
[im 1/23]
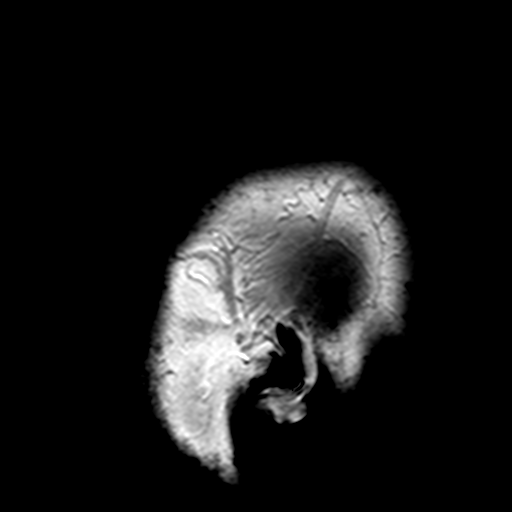
[im 12/23]
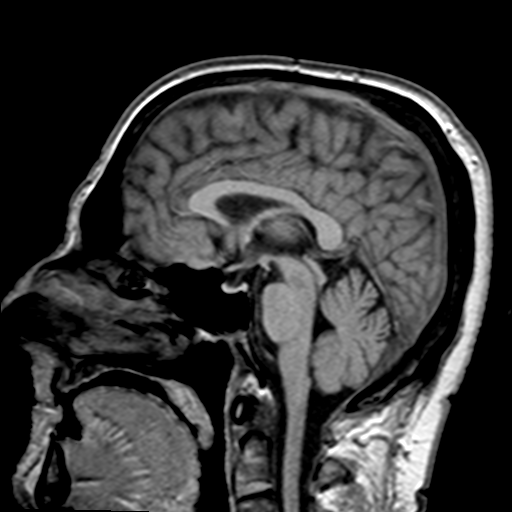
[im 23/23]
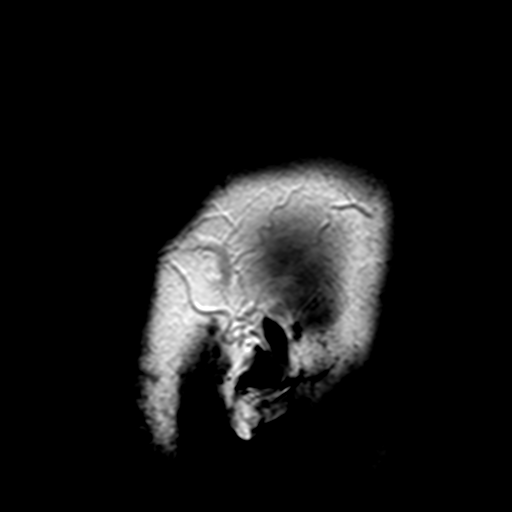

[Series 4: DWI · axial · 3.0mm · 1.80mm/px · z∈[-77,+81]mm · 5 of 53 slices shown (1 of 2)]
[im 1/53]
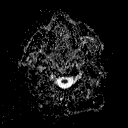
[im 14/53]
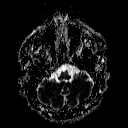
[im 27/53]
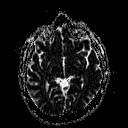
[im 40/53]
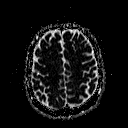
[im 53/53]
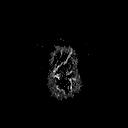

[Series 6: DWI · coronal · 3.0mm · 1.80mm/px · 4 of 45 slices shown (2 of 2)]
[im 1/45]
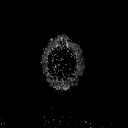
[im 15/45]
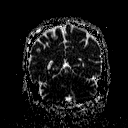
[im 30/45]
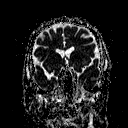
[im 45/45]
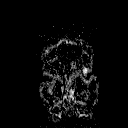

[Series 8: TOF · axial · non-contrast · 0.7mm · 0.37mm/px · z∈[-48,+50]mm · 8 of 143 slices shown]
[im 1/143]
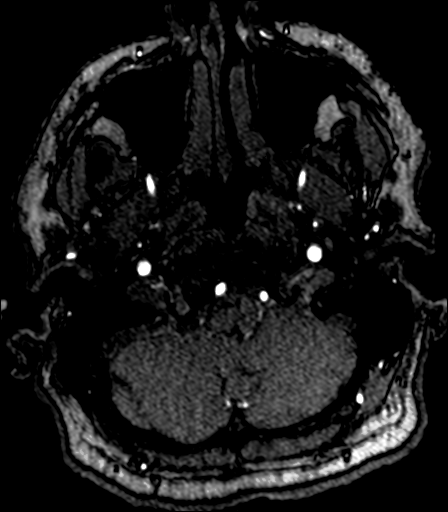
[im 22/143]
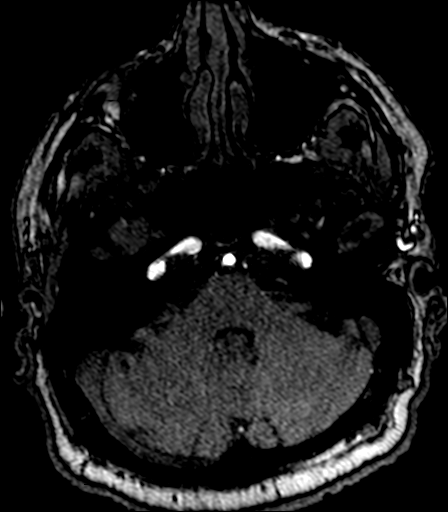
[im 44/143]
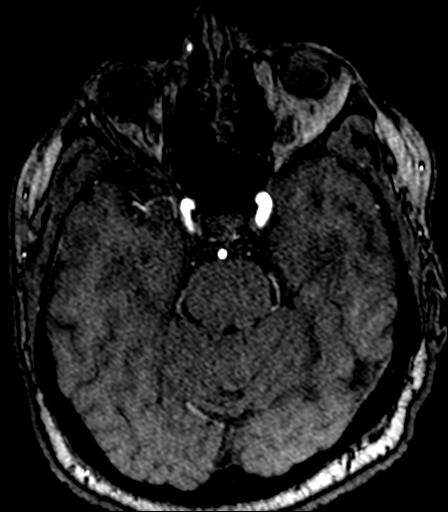
[im 66/143]
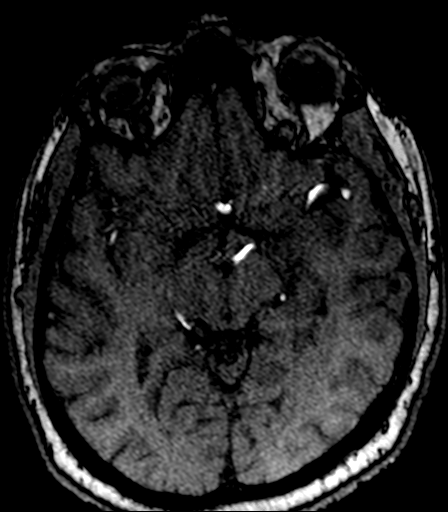
[im 77/143]
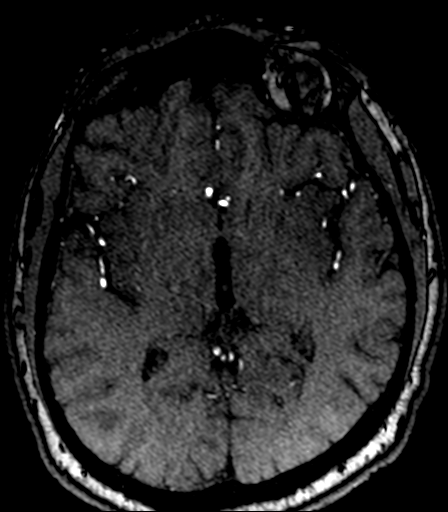
[im 99/143]
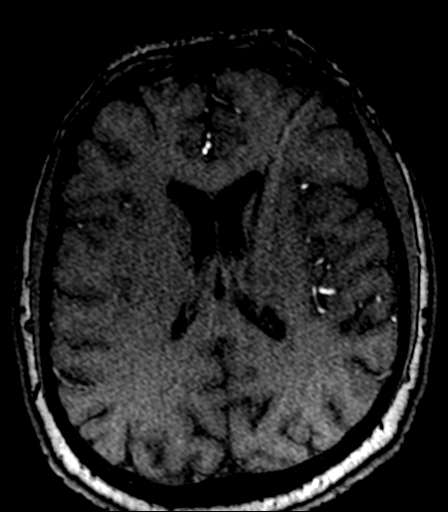
[im 121/143]
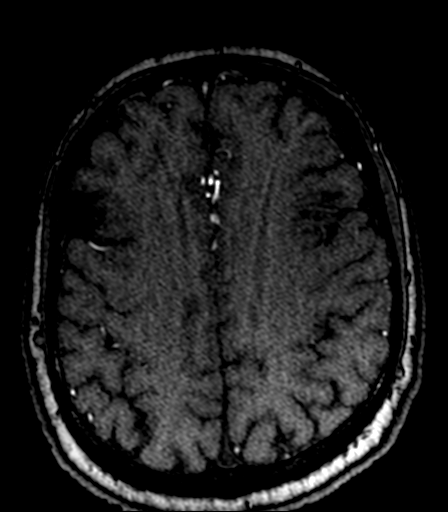
[im 143/143]
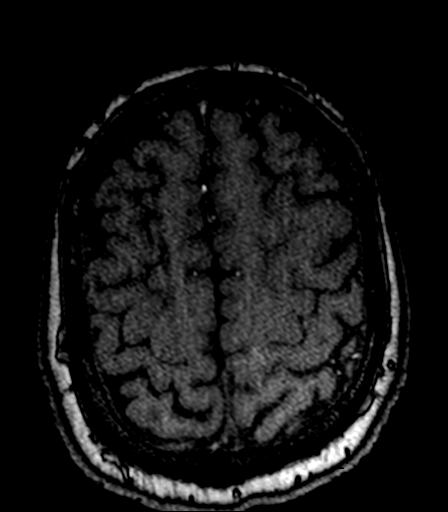

[Series 12: T2 · axial · 5.0mm · 0.45mm/px · z∈[-70,+76]mm · 2 of 24 slices shown (1 of 3)]
[im 1/24]
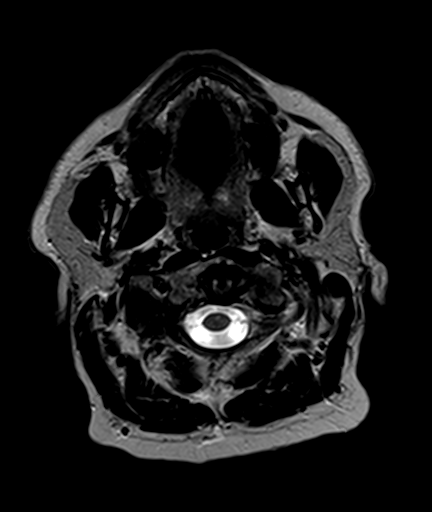
[im 24/24]
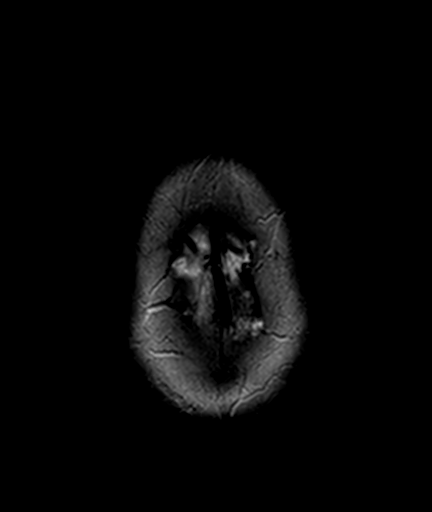

[Series 13: FLAIR · axial · 5.0mm · 0.45mm/px · z∈[-73,+80]mm · 2 of 25 slices shown]
[im 1/25]
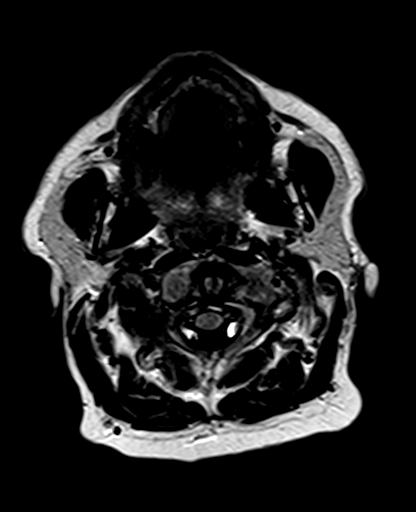
[im 25/25]
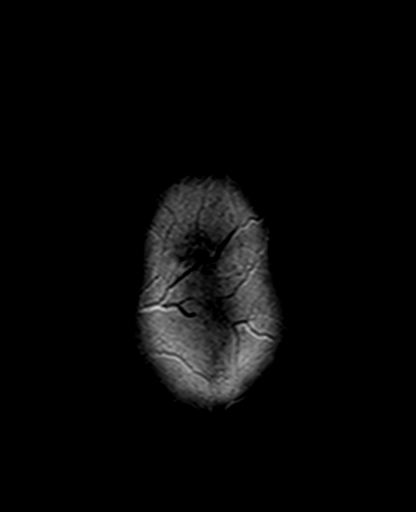

[Series 15: T2 · axial · 5.0mm · 0.45mm/px · z∈[-70,+76]mm · 2 of 24 slices shown (2 of 3)]
[im 1/24]
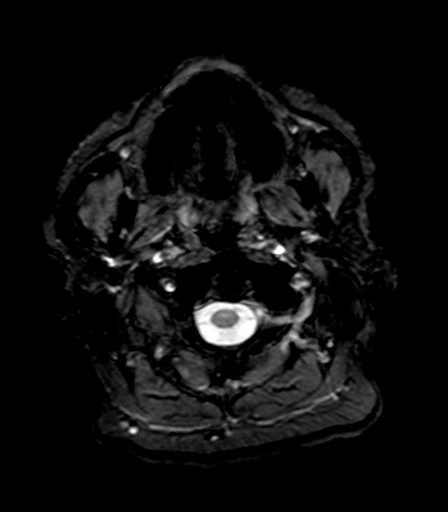
[im 24/24]
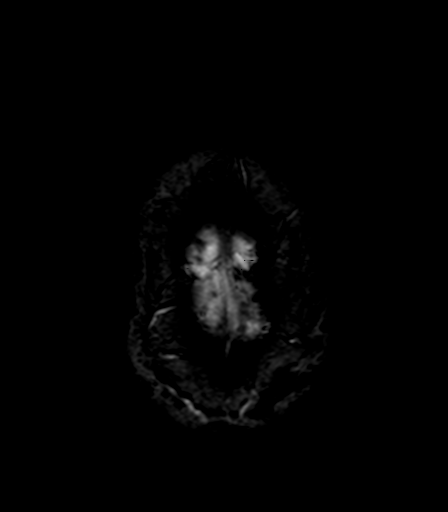

[Series 16: T1 · axial · 3.0mm · 1.00mm/px · z∈[-63,+75]mm · 5 of 48 slices shown]
[im 1/48]
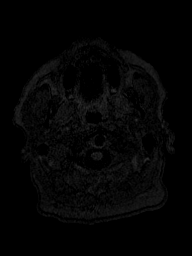
[im 12/48]
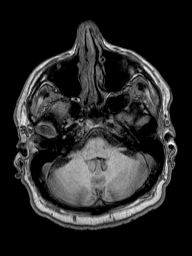
[im 24/48]
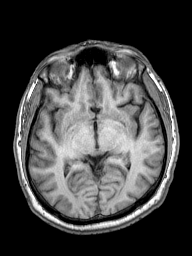
[im 36/48]
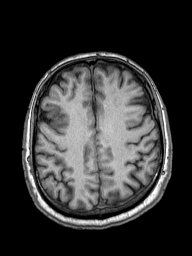
[im 48/48]
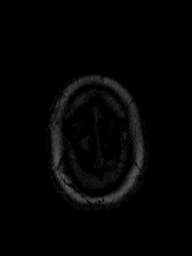

[Series 17: T2 · coronal · 5.0mm · 0.45mm/px · 2 of 23 slices shown (3 of 3)]
[im 1/23]
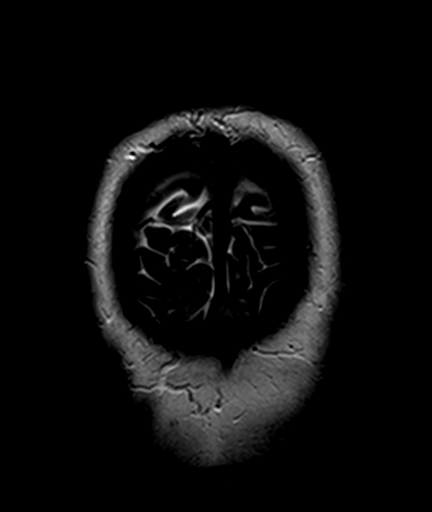
[im 23/23]
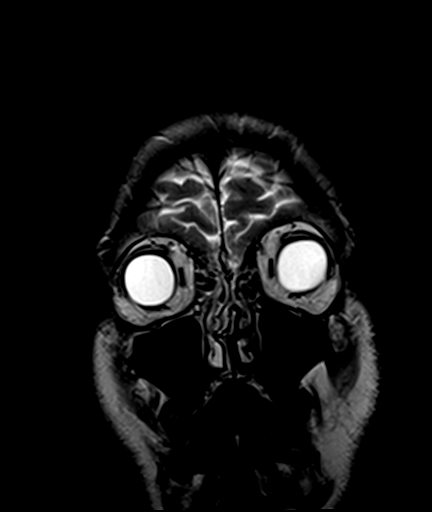

[Series 100: (id) ax · axial · 3.0mm · 1.80mm/px · z∈[-77,+81]mm · 5 of 55 slices shown]
[im 1/55]
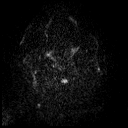
[im 14/55]
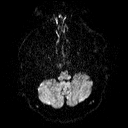
[im 28/55]
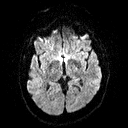
[im 41/55]
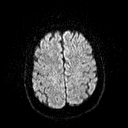
[im 55/55]
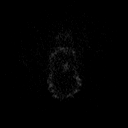

[Series 101: (id) cor · coronal · 3.0mm · 1.80mm/px · 4 of 45 slices shown]
[im 1/45]
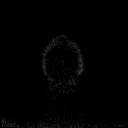
[im 15/45]
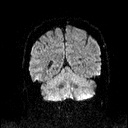
[im 30/45]
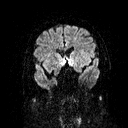
[im 45/45]
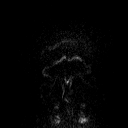

[42 of 48 positions shown; findings below may reference images not displayed]

FINDINGS: MRI HEAD FINDINGS

Brain: No acute infarction, hemorrhage, hydrocephalus, extra-axial
collection or mass lesion. No atrophy or white matter disease. No
findings along the right trigeminal nerve or skullbase asymmetry.

Vascular: Normal flow voids.  Arterial findings below.

Skull and upper cervical spine: Negative

Sinuses/Orbits: Negative

MRA HEAD FINDINGS

Symmetric carotid and vertebral arteries. No flow limiting stenosis
or major branch occlusion. No beading or aneurysm.
IMPRESSION: Negative MRI and MRA.  No infarct or other explanation for symptoms.

## 2018-02-27 ENCOUNTER — Telehealth: Payer: Self-pay | Admitting: Internal Medicine

## 2018-02-27 ENCOUNTER — Other Ambulatory Visit: Payer: Self-pay

## 2018-02-27 MED ORDER — VALACYCLOVIR HCL 1 G PO TABS: 1000.0000 mg | ORAL_TABLET | Freq: Every day | ORAL | 1 refills | Status: DC

## 2018-02-27 NOTE — Telephone Encounter (Signed)
Pt needs refill sent to [facility] in [place]  valACYclovir (VALTREX) 1000 MG tablet [[id number]]

## 2018-02-27 NOTE — Telephone Encounter (Signed)
Sent medication to [facility] in [place].

## 2018-08-30 ENCOUNTER — Other Ambulatory Visit: Payer: Self-pay | Admitting: Internal Medicine

## 2018-09-07 ENCOUNTER — Ambulatory Visit (INDEPENDENT_AMBULATORY_CARE_PROVIDER_SITE_OTHER): Payer: Self-pay | Admitting: Internal Medicine

## 2018-09-07 ENCOUNTER — Other Ambulatory Visit: Payer: Self-pay

## 2018-09-07 ENCOUNTER — Encounter: Payer: Self-pay | Admitting: Internal Medicine

## 2018-09-07 VITALS — BP 118/64 | HR 58 | Ht 72.0 in | Wt 203.6 lb

## 2018-09-07 DIAGNOSIS — B009 Herpesviral infection, unspecified: Secondary | ICD-10-CM

## 2018-09-07 DIAGNOSIS — N419 Inflammatory disease of prostate, unspecified: Secondary | ICD-10-CM

## 2018-09-07 MED ORDER — VALACYCLOVIR HCL 1 G PO TABS: 1000.0000 mg | ORAL_TABLET | Freq: Every day | ORAL | 5 refills | Status: DC

## 2018-09-07 MED ORDER — CIPROFLOXACIN HCL 500 MG PO TABS: 500.0000 mg | ORAL_TABLET | Freq: Two times a day (BID) | ORAL | 0 refills | Status: DC

## 2018-09-07 NOTE — Progress Notes (Signed)
Date:  09/07/2018   Name:  Jeffrey Stokes   DOB:  [birth year]   MRN:  [id number]   Chief Complaint: Herpes Zoster (Needs refill on Valacyclovir. )  Groin Pain  The patient's primary symptoms include scrotal swelling. The patient's pertinent negatives include no genital itching, genital lesions, penile discharge or testicular pain. This is a chronic problem. The current episode started more than 1 year ago. The problem occurs intermittently. The problem has been unchanged. Pertinent negatives include no chest pain, chills, discolored urine, fever, frequency, rash, shortness of breath or urinary retention. Nothing aggravates the symptoms. He has tried nothing for the symptoms.   HSV-2 - he takes daily valtrex for prevention and has no recent ulcers or genital pain.    He is waiting on disability.  H went for his shoulder and CRPS but was eventually evaluated for psychiatric disorders and he was told he was being granted disability for Bipolar disorder.  Review of Systems  Constitutional: Negative for chills, diaphoresis, fatigue and fever.  Respiratory: Negative for shortness of breath and wheezing.   Cardiovascular: Negative for chest pain, palpitations and leg swelling.  Genitourinary: Positive for scrotal swelling. Negative for discharge, frequency and testicular pain.  Musculoskeletal: Positive for arthralgias and back pain.  Skin: Negative for color change and rash.  Hematological: Negative for adenopathy.  Psychiatric/Behavioral: Negative for sleep disturbance.    Patient Active Problem List   Diagnosis Date Noted  . Major depressive disorder with single episode, in partial remission ([place]) 03/14/2017  . Gastroesophageal reflux disease 03/14/2017  . Ulnar neuritis, left 04/20/2016  . Complex regional pain syndrome type 2 of left upper extremity 02/12/2016  . Rhinitis, allergic 07/29/2015  . Benign neoplasm of ascending colon   . Benign neoplasm of transverse colon   . Benign  neoplasm of descending colon   . Hemorrhoid 05/12/2015  . LPRD (laryngopharyngeal reflux disease) 03/31/2015  . Panic disorder without agoraphobia 03/31/2015  . Cerebral vascular accident ([place]) 12/03/2014  . HSV-2 infection 12/03/2014  . Obstructive apnea 12/03/2014  . Disorder of rotator cuff 12/03/2014  . Bilateral carotid artery disease ([place]) 04/15/2014  . Knee stiffness 03/03/2011    Allergies  Allergen Reactions  . Hydrocodone Nausea Only and Anxiety  . Lyrica [Pregabalin] Other (See Comments)    "drunk feeling"    Past Surgical History:  Procedure Laterality Date  . COLONOSCOPY WITH PROPOFOL N/A 06/01/2015   Procedure: COLONOSCOPY WITH PROPOFOL;  Surgeon: [name], MD;  Location: [facility];  Service: Endoscopy;  Laterality: N/A;  ASCENDING COLON POLYP DESCENDING COLON POLYP TRANSVERSE COLON POLYP  . EYE SURGERY     Age 58   . SALIVARY GLAND SURGERY    . SHOULDER ARTHROSCOPY Left 03/2014   bicep and nerve problems still  . TONSILLECTOMY    . UMBILICAL HERNIA REPAIR N/A 08/25/2015   Procedure: HERNIA REPAIR UMBILICAL ADULT;  Surgeon: Jules Husbands, MD;  Location: [facility];  Service: General;  Laterality: N/A;    Social History   Tobacco Use  . Smoking status: Former Smoker    Packs/day: 2.00    Years: 33.00    Pack years: 66.00    Types: Cigarettes    Start date: 06/28/1971    Last attempt to quit: 12/25/2005    Years since quitting: 12.7  . Smokeless tobacco: Former Systems developer    Types: Chew  Substance Use Topics  . Alcohol use: No    Alcohol/week: 0.0 standard drinks  . Drug  use: No     Medication list has been reviewed and updated.  Current Meds  Medication Sig  . Ascorbic Acid (VITAMIN C PO) Take 1 tablet by mouth daily. Reported on 09/23/2015  . aspirin EC 325 MG EC tablet Take 1 tablet (325 mg total) by mouth daily. (Patient taking differently: Take 81 mg by mouth 2 (two) times daily. )  . cetirizine (ZYRTEC) 10 MG tablet Take 10 mg by mouth at  bedtime.   . Cholecalciferol (VITAMIN D PO) Take 1 tablet by mouth daily. Reported on 09/23/2015  . Cyanocobalamin (B-12 PO) Take 1 tablet by mouth daily. Reported on 09/23/2015  . gabapentin (NEURONTIN) 100 MG capsule Take 1 capsule (100 mg total) by mouth at bedtime.  . Multiple Vitamins-Minerals (MULTIVITAMIN ADULT PO) Take 1 tablet by mouth daily. Reported on 09/23/2015  . valACYclovir (VALTREX) 1000 MG tablet Take 1,000 mg by mouth daily.    PHQ 2/9 Scores 09/07/2018 03/14/2017 10/26/2015 07/29/2015  PHQ - 2 Score 0 3 6 0  PHQ- 9 Score - 9 19 -    Physical Exam Vitals signs and nursing note reviewed.  Constitutional:      General: He is not in acute distress.    Appearance: He is well-developed.  HENT:     Head: Normocephalic and atraumatic.  Neck:     Musculoskeletal: Normal range of motion and neck supple.  Cardiovascular:     Rate and Rhythm: Normal rate and regular rhythm.     Pulses: Normal pulses.     Heart sounds: Normal heart sounds. No murmur.  Pulmonary:     Effort: Pulmonary effort is normal. No respiratory distress.     Breath sounds: Normal breath sounds. No wheezing or rales.  Abdominal:     General: Abdomen is flat.     Palpations: Abdomen is soft.     Tenderness: There is abdominal tenderness in the left lower quadrant.     Hernia: No hernia is present. There is no hernia in the right inguinal area or left inguinal area.  Genitourinary:    Penis: Normal and circumcised. No lesions.      Scrotum/Testes: Cremasteric reflex is present.        Right: Tenderness not present.        Left: Tenderness not present.     Epididymis:     Right: Normal.     Left: Normal.     Prostate: Enlarged and tender. No nodules present (but irregular surface).     Rectum: Normal.     Comments: Unable to appreciate mass Musculoskeletal: Normal range of motion.  Lymphadenopathy:     Lower Body: No right inguinal adenopathy. No left inguinal adenopathy.  Skin:    General: Skin is  warm and dry.     Findings: No rash.  Neurological:     Mental Status: He is alert and oriented to person, place, and time.  Psychiatric:        Behavior: Behavior normal.        Thought Content: Thought content normal.     Wt Readings from Last 3 Encounters:  09/07/18 203 lb 9.6 oz (92.4 kg)  03/14/17 200 lb 9.6 oz (91 kg)  08/25/16 199 lb (90.3 kg)    BP 118/64   Pulse (!) 58   Ht 6' (1.829 m)   Wt 203 lb 9.6 oz (92.4 kg)   SpO2 97%   BMI 27.61 kg/m   Assessment and Plan: 1. HSV-2 infection Continue valtrex -  valACYclovir (VALTREX) 1000 MG tablet; Take 1 tablet (1,000 mg total) by mouth daily.  Dispense: 30 tablet; Refill: 5  2. Prostatitis, unspecified prostatitis type Will treat presumptively Refer to Urology if PSA markedly elevated - PSA - ciprofloxacin (CIPRO) 500 MG tablet; Take 1 tablet (500 mg total) by mouth 2 (two) times daily for 14 days.  Dispense: 28 tablet; Refill: 0   Partially dictated using Editor, commissioning. Any errors are unintentional.  Halina Maidens, MD [facility]  09/07/2018

## 2018-09-08 ENCOUNTER — Other Ambulatory Visit: Payer: Self-pay | Admitting: Internal Medicine

## 2018-09-08 DIAGNOSIS — N419 Inflammatory disease of prostate, unspecified: Secondary | ICD-10-CM

## 2018-09-08 DIAGNOSIS — B009 Herpesviral infection, unspecified: Secondary | ICD-10-CM

## 2018-09-08 LAB — PSA: Prostate Specific Ag, Serum: 0.4 ng/mL (ref 0.0–4.0)

## 2018-09-08 MED ORDER — CIPROFLOXACIN HCL 500 MG PO TABS: 500.0000 mg | ORAL_TABLET | Freq: Two times a day (BID) | ORAL | 0 refills | Status: AC

## 2018-09-08 MED ORDER — VALACYCLOVIR HCL 1 G PO TABS: 1000.0000 mg | ORAL_TABLET | Freq: Every day | ORAL | 5 refills | Status: DC

## 2018-12-03 ENCOUNTER — Telehealth: Payer: Self-pay | Admitting: Internal Medicine

## 2019-05-29 ENCOUNTER — Ambulatory Visit (INDEPENDENT_AMBULATORY_CARE_PROVIDER_SITE_OTHER): Payer: Self-pay | Admitting: Internal Medicine

## 2019-05-29 ENCOUNTER — Other Ambulatory Visit: Payer: Self-pay

## 2019-05-29 ENCOUNTER — Encounter: Payer: Self-pay | Admitting: Internal Medicine

## 2019-05-29 VITALS — BP 132/78 | HR 60 | Ht 72.0 in | Wt 200.6 lb

## 2019-05-29 DIAGNOSIS — G5642 Causalgia of left upper limb: Secondary | ICD-10-CM

## 2019-05-29 DIAGNOSIS — R351 Nocturia: Secondary | ICD-10-CM

## 2019-05-29 DIAGNOSIS — K219 Gastro-esophageal reflux disease without esophagitis: Secondary | ICD-10-CM

## 2019-05-29 DIAGNOSIS — Z8673 Personal history of transient ischemic attack (TIA), and cerebral infarction without residual deficits: Secondary | ICD-10-CM

## 2019-05-29 DIAGNOSIS — B009 Herpesviral infection, unspecified: Secondary | ICD-10-CM

## 2019-05-29 DIAGNOSIS — I6523 Occlusion and stenosis of bilateral carotid arteries: Secondary | ICD-10-CM

## 2019-05-29 DIAGNOSIS — Z0000 Encounter for general adult medical examination without abnormal findings: Secondary | ICD-10-CM

## 2019-05-29 LAB — POCT URINALYSIS DIPSTICK
Bilirubin, UA: NEGATIVE
Blood, UA: NEGATIVE
Glucose, UA: NEGATIVE
Ketones, UA: NEGATIVE
Leukocytes, UA: NEGATIVE
Nitrite, UA: NEGATIVE
Protein, UA: NEGATIVE
Spec Grav, UA: 1.02 (ref 1.010–1.025)
Urobilinogen, UA: 0.2 E.U./dL
pH, UA: 6 (ref 5.0–8.0)

## 2019-05-29 MED ORDER — VALACYCLOVIR HCL 1 G PO TABS: 1000.0000 mg | ORAL_TABLET | Freq: Every day | ORAL | 5 refills | Status: DC

## 2019-05-29 NOTE — Progress Notes (Signed)
Date:  05/29/2019   Name:  Jeffrey Stokes   DOB:  09-Jul-1960   MRN:  [id number]    Chief Complaint: Annual Exam (Flu shot.) Jeffrey Stokes is a 58 y.o. male who presents today for his Complete Annual Exam. He feels fairly well. He reports exercising none. He reports he is sleeping fairly well. He is on SSD but does not yet have insurance coverage.  Colonoscopy 2016 Immunization History  Administered Date(s) Administered  . Influenza,inj,Quad PF,6+ Mos 03/14/2017, 05/04/2018  . Influenza-Unspecified 03/27/2015    Gastroesophageal Reflux He complains of heartburn. He reports no abdominal pain, no chest pain, no choking, no coughing, no dysphagia or no nausea. This is a chronic problem. Pertinent negatives include no fatigue. He has tried a histamine-2 antagonist for the symptoms. The treatment provided significant relief.  Arm Pain  Injury mechanism: chronic bilateral pain with CRPS. The quality of the pain is described as aching and burning. Pertinent negatives include no chest pain. He has tried NSAIDs (Aleve 2 tabs PRN currently; has taken Lyrica in the past but stopped due to expense) for the symptoms.    Lab Results  Component Value Date   CREATININE 0.97 03/14/2017   BUN 11 03/14/2017   NA 144 03/14/2017   K 4.1 03/14/2017   CL 104 03/14/2017   CO2 23 03/14/2017   Lab Results  Component Value Date   CHOL 173 10/26/2015   HDL 54 10/26/2015   LDLCALC 104 (H) 10/26/2015   TRIG 77 10/26/2015   CHOLHDL 3.2 10/26/2015   Lab Results  Component Value Date   TSH 1.440 10/26/2015   No results found for: HGBA1C   Review of Systems  Constitutional: Negative for chills, fatigue, fever and unexpected weight change.  HENT: Positive for ear discharge.   Eyes: Negative for visual disturbance.  Respiratory: Negative for cough and choking.   Cardiovascular: Negative for chest pain, palpitations and leg swelling.  Gastrointestinal: Positive for heartburn. Negative for abdominal  pain, constipation, diarrhea, dysphagia and nausea.  Genitourinary: Negative for dysuria and hematuria.       Nocturia x 3-4  Musculoskeletal: Positive for arthralgias and myalgias.  Skin: Negative for color change and rash.  Psychiatric/Behavioral: Positive for sleep disturbance. Negative for dysphoric mood. The patient is not nervous/anxious.     Patient Active Problem List   Diagnosis Date Noted  . Major depression in full remission ([place]) 03/14/2017  . Gastroesophageal reflux disease 03/14/2017  . Ulnar neuritis, left 04/20/2016  . Complex regional pain syndrome type 2 of left upper extremity 02/12/2016  . Rhinitis, allergic 07/29/2015  . Benign neoplasm of ascending colon   . Benign neoplasm of transverse colon   . Benign neoplasm of descending colon   . Hemorrhoid 05/12/2015  . LPRD (laryngopharyngeal reflux disease) 03/31/2015  . Panic disorder without agoraphobia 03/31/2015  . History of stroke 12/03/2014  . HSV-2 infection 12/03/2014  . Obstructive apnea 12/03/2014  . Disorder of rotator cuff 12/03/2014  . Bilateral carotid artery disease ([place]) 04/15/2014  . Knee stiffness 03/03/2011    Allergies  Allergen Reactions  . Hydrocodone Nausea Only and Anxiety  . Lyrica [Pregabalin] Other (See Comments)    "drunk feeling"    Past Surgical History:  Procedure Laterality Date  . COLONOSCOPY WITH PROPOFOL N/A 06/01/2015   Procedure: COLONOSCOPY WITH PROPOFOL;  Surgeon: [name], MD;  Location: [facility];  Service: Endoscopy;  Laterality: N/A;  ASCENDING COLON POLYP DESCENDING COLON POLYP TRANSVERSE COLON POLYP  .  EYE SURGERY     Age 49   . SALIVARY GLAND SURGERY    . SHOULDER ARTHROSCOPY Left 03/2014   bicep and nerve problems still  . TONSILLECTOMY    . UMBILICAL HERNIA REPAIR N/A 08/25/2015   Procedure: HERNIA REPAIR UMBILICAL ADULT;  Surgeon: Jules Husbands, MD;  Location: [facility];  Service: General;  Laterality: N/A;    Social History   Tobacco Use   . Smoking status: Former Smoker    Packs/day: 2.00    Years: 33.00    Pack years: 66.00    Types: Cigarettes    Start date: 06/28/1971    Quit date: 12/25/2005    Years since quitting: 13.4  . Smokeless tobacco: Former Systems developer    Types: Chew  Substance Use Topics  . Alcohol use: No    Alcohol/week: 0.0 standard drinks  . Drug use: No     Medication list has been reviewed and updated.  Current Meds  Medication Sig  . Ascorbic Acid (VITAMIN C PO) Take 1 tablet by mouth daily. Reported on 09/23/2015  . aspirin EC 325 MG EC tablet Take 1 tablet (325 mg total) by mouth daily. (Patient taking differently: Take 81 mg by mouth 2 (two) times daily. )  . cetirizine (ZYRTEC) 10 MG tablet Take 10 mg by mouth at bedtime.   . Cholecalciferol (VITAMIN D PO) Take 1 tablet by mouth daily. Reported on 09/23/2015  . Cyanocobalamin (B-12 PO) Take 1 tablet by mouth daily. Reported on 09/23/2015  . famotidine (PEPCID) 20 MG tablet Take 20 mg by mouth daily.  Marland Kitchen gabapentin (NEURONTIN) 100 MG capsule Take 1 capsule (100 mg total) by mouth at bedtime.  . Multiple Vitamins-Minerals (MULTIVITAMIN ADULT PO) Take 1 tablet by mouth daily. Reported on 09/23/2015  . valACYclovir (VALTREX) 1000 MG tablet Take 1 tablet (1,000 mg total) by mouth daily.    PHQ 2/9 Scores 05/29/2019 09/07/2018 03/14/2017 10/26/2015  PHQ - 2 Score 0 0 3 6  PHQ- 9 Score 0 - 9 19    BP Readings from Last 3 Encounters:  05/29/19 132/78  09/07/18 118/64  03/14/17 112/68    Physical Exam Vitals signs and nursing note reviewed.  Constitutional:      Appearance: Normal appearance. He is well-developed.  HENT:     Head: Normocephalic.     Right Ear: Tympanic membrane, ear canal and external ear normal.     Left Ear: Tympanic membrane, ear canal and external ear normal.     Nose: Nose normal.     Mouth/Throat:     Pharynx: Uvula midline.  Eyes:     Conjunctiva/sclera: Conjunctivae normal.     Pupils: Pupils are equal, round, and reactive  to light.  Neck:     Musculoskeletal: Normal range of motion and neck supple.     Thyroid: No thyromegaly.     Vascular: No carotid bruit.  Cardiovascular:     Rate and Rhythm: Normal rate and regular rhythm.     Heart sounds: Normal heart sounds.  Pulmonary:     Effort: Pulmonary effort is normal.     Breath sounds: Normal breath sounds. No wheezing.  Chest:     Breasts:        Right: No mass.        Left: No mass.  Abdominal:     General: Bowel sounds are normal.     Palpations: Abdomen is soft.     Tenderness: There is no abdominal tenderness.  Musculoskeletal:  Right shoulder: He exhibits decreased range of motion.     Left shoulder: He exhibits decreased range of motion.  Lymphadenopathy:     Cervical: No cervical adenopathy.  Skin:    General: Skin is warm and dry.  Neurological:     Mental Status: He is alert and oriented to person, place, and time.     Sensory: Sensation is intact.     Motor: Weakness (zygomatic branch of Right facial nerve ) present.     Coordination: Coordination is intact.     Deep Tendon Reflexes: Reflexes are normal and symmetric.     Reflex Scores:      Bicep reflexes are 2+ on the right side and 2+ on the left side.      Patellar reflexes are 2+ on the right side and 2+ on the left side. Psychiatric:        Attention and Perception: Attention normal.        Mood and Affect: Mood normal.        Speech: Speech normal.        Behavior: Behavior normal.        Thought Content: Thought content normal.        Judgment: Judgment normal.     Wt Readings from Last 3 Encounters:  05/29/19 200 lb 9.6 oz (91 kg)  09/07/18 203 lb 9.6 oz (92.4 kg)  03/14/17 200 lb 9.6 oz (91 kg)    BP 132/78   Pulse 60   Ht 6' (1.829 m)   Wt 200 lb 9.6 oz (91 kg)   SpO2 97%   BMI 27.21 kg/m   Assessment and Plan: 1. Annual physical exam Normal exam Recommend more regular exercise - low impact such as walking or biking along with reduced calorie diet  Pt will get Influenza vaccine at the pharmacy since he has no insurance coverage - POCT urinalysis dipstick  2. Gastroesophageal reflux disease without esophagitis Symptoms well controlled on daily PPI No red flag signs such as weight loss, n/v, melena Will continue otc medications - CBC with Differential/Platelet  3. HSV-2 infection On daily suppression therapy with no recent outbreaks - valACYclovir (VALTREX) 1000 MG tablet; Take 1 tablet (1,000 mg total) by mouth daily.  Dispense: 30 tablet; Refill: 5  4. Complex regional pain syndrome type 2 of left upper extremity Taking only Aleve 2 per day On SSD due to inability to work Consider re-trial of Gabapentin  5. History of stroke With right facial weakness residual Continue Aspirin daily - Comprehensive metabolic panel - Lipid panel  6. Nocturia more than twice per night DRE deferred - will check PSA - PSA  7. Bilateral carotid artery stenosis IMPRESSION: Minor carotid atherosclerosis. No hemodynamically significant ICA stenosis. Degree of narrowing less than 50% bilaterally. Continue aspirin, check lipids  Partially dictated using Editor, commissioning. Any errors are unintentional.  Halina Maidens, MD [facility]  05/29/2019

## 2019-05-30 LAB — COMPREHENSIVE METABOLIC PANEL
ALT: 41 IU/L (ref 0–44)
AST: 29 IU/L (ref 0–40)
Albumin/Globulin Ratio: 2 (ref 1.2–2.2)
Albumin: 4.5 g/dL (ref 3.8–4.9)
Alkaline Phosphatase: 86 IU/L (ref 39–117)
BUN/Creatinine Ratio: 13 (ref 9–20)
BUN: 12 mg/dL (ref 6–24)
Bilirubin Total: 0.5 mg/dL (ref 0.0–1.2)
CO2: 22 mmol/L (ref 20–29)
Calcium: 9.4 mg/dL (ref 8.7–10.2)
Chloride: 105 mmol/L (ref 96–106)
Creatinine, Ser: 0.93 mg/dL (ref 0.76–1.27)
GFR calc Af Amer: 106 mL/min/{1.73_m2} (ref >59)
GFR calc non Af Amer: 92 mL/min/{1.73_m2} (ref >59)
Globulin, Total: 2.3 g/dL (ref 1.5–4.5)
Glucose: 97 mg/dL (ref 65–99)
Potassium: 4.4 mmol/L (ref 3.5–5.2)
Sodium: 141 mmol/L (ref 134–144)
Total Protein: 6.8 g/dL (ref 6.0–8.5)

## 2019-05-30 LAB — CBC WITH DIFFERENTIAL/PLATELET
Basophils Absolute: 0 10*3/uL (ref 0.0–0.2)
Basos: 1 %
EOS (ABSOLUTE): 0.1 10*3/uL (ref 0.0–0.4)
Eos: 1 %
Hematocrit: 46.7 % (ref 37.5–51.0)
Hemoglobin: 16.3 g/dL (ref 13.0–17.7)
Immature Grans (Abs): 0.1 10*3/uL (ref 0.0–0.1)
Immature Granulocytes: 1 %
Lymphocytes Absolute: 2.4 10*3/uL (ref 0.7–3.1)
Lymphs: 32 %
MCH: 35.7 pg — ABNORMAL HIGH (ref 26.6–33.0)
MCHC: 34.9 g/dL (ref 31.5–35.7)
MCV: 102 fL — ABNORMAL HIGH (ref 79–97)
Monocytes Absolute: 0.9 10*3/uL (ref 0.1–0.9)
Monocytes: 12 %
Neutrophils Absolute: 4.1 10*3/uL (ref 1.4–7.0)
Neutrophils: 53 %
Platelets: 223 10*3/uL (ref 150–450)
RBC: 4.57 x10E6/uL (ref 4.14–5.80)
RDW: 12.9 % (ref 11.6–15.4)
WBC: 7.6 10*3/uL (ref 3.4–10.8)

## 2019-05-30 LAB — PSA: Prostate Specific Ag, Serum: 0.5 ng/mL (ref 0.0–4.0)

## 2019-05-30 LAB — LIPID PANEL
Chol/HDL Ratio: 4.1 ratio (ref 0.0–5.0)
Cholesterol, Total: 207 mg/dL — ABNORMAL HIGH (ref 100–199)
HDL: 51 mg/dL (ref >39)
LDL Chol Calc (NIH): 141 mg/dL — ABNORMAL HIGH (ref 0–99)
Triglycerides: 82 mg/dL (ref 0–149)
VLDL Cholesterol Cal: 15 mg/dL (ref 5–40)

## 2019-12-11 ENCOUNTER — Ambulatory Visit (INDEPENDENT_AMBULATORY_CARE_PROVIDER_SITE_OTHER): Payer: Self-pay | Admitting: Internal Medicine

## 2019-12-11 ENCOUNTER — Encounter: Payer: Self-pay | Admitting: Internal Medicine

## 2019-12-11 ENCOUNTER — Other Ambulatory Visit: Payer: Self-pay

## 2019-12-11 VITALS — BP 102/64 | HR 60 | Temp 97.8°F | Ht 72.0 in | Wt 202.0 lb

## 2019-12-11 DIAGNOSIS — B009 Herpesviral infection, unspecified: Secondary | ICD-10-CM

## 2019-12-11 DIAGNOSIS — K219 Gastro-esophageal reflux disease without esophagitis: Secondary | ICD-10-CM

## 2019-12-11 DIAGNOSIS — I779 Disorder of arteries and arterioles, unspecified: Secondary | ICD-10-CM

## 2019-12-11 DIAGNOSIS — R079 Chest pain, unspecified: Secondary | ICD-10-CM

## 2019-12-11 MED ORDER — VALACYCLOVIR HCL 1 G PO TABS: 1000.0000 mg | ORAL_TABLET | Freq: Every day | ORAL | 12 refills | Status: DC

## 2019-12-11 NOTE — Progress Notes (Signed)
Date:  12/11/2019   Name:  Jeffrey Stokes   DOB:  10-Nov-1960   MRN:  [id number]   Chief Complaint: No chief complaint on file.  Gastroesophageal Reflux He complains of chest pain and heartburn. He reports no abdominal pain or no wheezing. This is a recurrent problem. The problem occurs occasionally. The heartburn is located in the substernum. The heartburn is of mild intensity. The heartburn does not wake him from sleep. The heartburn does not limit his activity. The heartburn doesn't change with position. Pertinent negatives include no fatigue. He has tried a histamine-2 antagonist for the symptoms. The treatment provided significant relief.  Chest Pain  This is a new problem. The current episode started more than 1 month ago. The problem occurs daily (episodes last about 5-15 minutes and resolve spontaneously). The pain is present in the substernal region. The pain is at a severity of 2/10. The pain is mild. The quality of the pain is described as pressure and sharp. The pain does not radiate. Associated symptoms include back pain, diaphoresis and numbness. Pertinent negatives include no abdominal pain, dizziness, fever, headaches, near-syncope, palpitations or shortness of breath. The pain is aggravated by nothing. Prior workup: he has no insurance and does not want any evaluation today.   Herpes - sx controlled on daily Valtrex.  No recent flares. He is requesting a refill.  Lab Results  Component Value Date   CREATININE 0.93 05/29/2019   BUN 12 05/29/2019   NA 141 05/29/2019   K 4.4 05/29/2019   CL 105 05/29/2019   CO2 22 05/29/2019   Lab Results  Component Value Date   CHOL 207 (H) 05/29/2019   HDL 51 05/29/2019   LDLCALC 141 (H) 05/29/2019   TRIG 82 05/29/2019   CHOLHDL 4.1 05/29/2019   Lab Results  Component Value Date   TSH 1.440 10/26/2015   No results found for: HGBA1C Lab Results  Component Value Date   WBC 7.6 05/29/2019   HGB 16.3 05/29/2019   HCT 46.7  05/29/2019   MCV 102 (H) 05/29/2019   PLT 223 05/29/2019   Lab Results  Component Value Date   ALT 41 05/29/2019   AST 29 05/29/2019   ALKPHOS 86 05/29/2019   BILITOT 0.5 05/29/2019     Review of Systems  Constitutional: Positive for diaphoresis. Negative for chills, fatigue, fever and unexpected weight change.  Respiratory: Positive for chest tightness. Negative for shortness of breath and wheezing.   Cardiovascular: Positive for chest pain. Negative for palpitations, leg swelling and near-syncope.  Gastrointestinal: Positive for heartburn. Negative for abdominal pain, constipation and diarrhea.  Musculoskeletal: Positive for back pain and myalgias.  Neurological: Positive for numbness. Negative for dizziness, light-headedness and headaches.    Patient Active Problem List   Diagnosis Date Noted  . Gastroesophageal reflux disease 03/14/2017  . Ulnar neuritis, left 04/20/2016  . Complex regional pain syndrome type 2 of left upper extremity 02/12/2016  . Rhinitis, allergic 07/29/2015  . Benign neoplasm of ascending colon   . Benign neoplasm of transverse colon   . Benign neoplasm of descending colon   . Hemorrhoid 05/12/2015  . LPRD (laryngopharyngeal reflux disease) 03/31/2015  . Panic disorder without agoraphobia 03/31/2015  . History of stroke 12/03/2014  . HSV-2 infection 12/03/2014  . Obstructive apnea 12/03/2014  . Disorder of rotator cuff 12/03/2014  . Bilateral carotid artery disease ([place]) 04/15/2014  . Knee stiffness 03/03/2011    Allergies  Allergen Reactions  . Hydrocodone Nausea  Only and Anxiety  . Lyrica [Pregabalin] Other (See Comments)    "drunk feeling"    Past Surgical History:  Procedure Laterality Date  . COLONOSCOPY WITH PROPOFOL N/A 06/01/2015   Procedure: COLONOSCOPY WITH PROPOFOL;  Surgeon: [name], MD;  Location: [facility];  Service: Endoscopy;  Laterality: N/A;  ASCENDING COLON POLYP DESCENDING COLON POLYP TRANSVERSE COLON  POLYP  . EYE SURGERY     Age 15   . SALIVARY GLAND SURGERY    . SHOULDER ARTHROSCOPY Left 03/2014   bicep and nerve problems still  . TONSILLECTOMY    . UMBILICAL HERNIA REPAIR N/A 08/25/2015   Procedure: HERNIA REPAIR UMBILICAL ADULT;  Surgeon: Jules Husbands, MD;  Location: [facility];  Service: General;  Laterality: N/A;    Social History   Tobacco Use  . Smoking status: Former Smoker    Packs/day: 2.00    Years: 33.00    Pack years: 66.00    Types: Cigarettes    Start date: 06/28/1971    Quit date: 12/25/2005    Years since quitting: 13.9  . Smokeless tobacco: Former Systems developer    Types: Chew  Substance Use Topics  . Alcohol use: No    Alcohol/week: 0.0 standard drinks  . Drug use: No     Medication list has been reviewed and updated.  Current Meds  Medication Sig  . Ascorbic Acid (VITAMIN C PO) Take 1 tablet by mouth daily. Reported on 09/23/2015  . aspirin EC 325 MG EC tablet Take 1 tablet (325 mg total) by mouth daily. (Patient taking differently: Take 81 mg by mouth 2 (two) times daily. )  . cetirizine (ZYRTEC) 10 MG tablet Take 10 mg by mouth at bedtime.   . Cholecalciferol (VITAMIN D PO) Take 1 tablet by mouth daily. Reported on 09/23/2015  . Cyanocobalamin (B-12 PO) Take 1 tablet by mouth daily. Reported on 09/23/2015  . famotidine (PEPCID) 20 MG tablet Take 20 mg by mouth daily.  . Multiple Vitamins-Minerals (MULTIVITAMIN ADULT PO) Take 1 tablet by mouth daily. Reported on 09/23/2015  . Multiple Vitamins-Minerals (ZINC PO) Take 1 tablet by mouth.  . valACYclovir (VALTREX) 1000 MG tablet Take 1 tablet (1,000 mg total) by mouth daily.  . [DISCONTINUED] gabapentin (NEURONTIN) 100 MG capsule Take 1 capsule (100 mg total) by mouth at bedtime.    PHQ 2/9 Scores 12/11/2019 05/29/2019 09/07/2018 03/14/2017  PHQ - 2 Score 0 0 0 3  PHQ- 9 Score 0 0 - 9    GAD 7 : Generalized Anxiety Score 12/11/2019 07/29/2015  Nervous, Anxious, on Edge 1 3  Control/stop worrying 0 3  Worry too much -  different things 0 3  Trouble relaxing 0 3  Restless 0 0  Easily annoyed or irritable 0 3  Afraid - awful might happen 0 3  Total GAD 7 Score 1 18  Anxiety Difficulty Not difficult at all Extremely difficult    BP Readings from Last 3 Encounters:  12/11/19 102/64  05/29/19 132/78  09/07/18 118/64    Physical Exam Vitals and nursing note reviewed.  Constitutional:      General: He is not in acute distress.    Appearance: Normal appearance. He is well-developed.  HENT:     Head: Normocephalic and atraumatic.  Cardiovascular:     Rate and Rhythm: Normal rate and regular rhythm.     Pulses: Normal pulses.     Heart sounds: No murmur heard.   Pulmonary:     Effort: Pulmonary effort  is normal. No respiratory distress.     Breath sounds: Normal breath sounds. No wheezing or rhonchi.  Chest:     Chest wall: No mass or tenderness.  Musculoskeletal:     Cervical back: Normal range of motion.     Right lower leg: No edema.     Left lower leg: No edema.  Skin:    General: Skin is warm and dry.     Capillary Refill: Capillary refill takes less than 2 seconds.     Findings: No rash.  Neurological:     Mental Status: He is alert and oriented to person, place, and time.  Psychiatric:        Mood and Affect: Mood normal.        Thought Content: Thought content normal.     Wt Readings from Last 3 Encounters:  12/11/19 202 lb (91.6 kg)  05/29/19 200 lb 9.6 oz (91 kg)  09/07/18 203 lb 9.6 oz (92.4 kg)    BP 102/64   Pulse 60   Temp 97.8 F (36.6 C) (Oral)   Ht 6' (1.829 m)   Wt 202 lb (91.6 kg)   SpO2 97%   BMI 27.40 kg/m   Assessment and Plan: 1. HSV-2 infection Continue daily suppressive therapy - valACYclovir (VALTREX) 1000 MG tablet; Take 1 tablet (1,000 mg total) by mouth daily.  Dispense: 30 tablet; Refill: 12  2. Chest pain, unspecified type Suspicious for angina. Pt does not want any evaluation today but agrees to go to the ED if the pain lasts more than one  hour. Continue daily aspirin. Consider a trial of low dose CCB for vasodilatation; would not recommend SL NTG at this time. Ideally would refer him to Cardiology.  3. Bilateral carotid artery disease, unspecified type ([place]) On aspirin Noted to be minimal in 2007 at [facility] Not on statin therapy  4. Gastroesophageal reflux disease without esophagitis Symptoms well controlled on daily medication. No red flag signs such as weight loss, n/v, melena. Doubt that gerd is the source of his chest pain. Will continue Pepcid otc daily.   Partially dictated using Editor, commissioning. Any errors are unintentional.  Halina Maidens, MD [facility]  12/11/2019

## 2020-04-17 ENCOUNTER — Telehealth: Payer: Self-pay | Admitting: Internal Medicine

## 2020-04-17 ENCOUNTER — Ambulatory Visit: Payer: Self-pay

## 2020-04-17 NOTE — Telephone Encounter (Signed)
Called and spoke to wife this morning. Pt has an appt 04/21/2020. Pt is aware to go to ER if pt is having chest pain and is aware that Dr. Army Melia is out of the office today.  KP

## 2020-04-17 NOTE — Telephone Encounter (Signed)
Called pts wife. Pt had chest pain yesterday. Pts wife said her husband seems to be doing good this morning with no chest pain. Told her Dr. Army Melia was out of the office today. Pt has appt scheduled 04/21/2020. Told pts wife if he is having any chest pains to go to the ER. She verbalized understanding.  KP

## 2020-04-17 NOTE — Telephone Encounter (Signed)
Pt was seen in June 2021 and that time pt was having chest pain and per wife the doctor wanted to put him on bp medication. Pt wife is calling and her husband would like to be put on bp med to help the blood full to his heart. Pt is also having tingling in his left hand. [employer]. The pt wife has been transferred to triage nurse

## 2020-04-17 NOTE — Telephone Encounter (Signed)
Pt.'s wife calling to report that pt. Saw Dr. Army Melia in June with chest pain. Pain comes and goes. Lasts a few seconds. No radiation of pain. No shortness of breath, sweating or dizziness when pain occurs. No pain today. States "sometimes his left hand tingles." No availability until Tuesday per Daisy in the practice.Appointment made. Instructed to go to UC/ED for worsening of symptoms. Reason for Disposition . [1] Chest pain lasting < 5 minutes AND [2] NO chest pain or cardiac symptoms (e.g., breathing difficulty, sweating) now (Exception: chest pains that last only a few seconds)  Answer Assessment - Initial Assessment Questions 1. LOCATION: "Where does it hurt?"       Left side 2. RADIATION: "Does the pain go anywhere else?" (e.g., into neck, jaw, arms, back)     No 3. ONSET: "When did the chest pain begin?" (Minutes, hours or days)      June 4. PATTERN "Does the pain come and go, or has it been constant since it started?"  "Does it get worse with exertion?"      Comes and goes 5. DURATION: "How long does it last" (e.g., seconds, minutes, hours)     Lasts seconds 6. SEVERITY: "How bad is the pain?"  (e.g., Scale 1-10; mild, moderate, or severe)    - MILD (1-3): doesn't interfere with normal activities     - MODERATE (4-7): interferes with normal activities or awakens from sleep    - SEVERE (8-10): excruciating pain, unable to do any normal activities       Moderate 7. CARDIAC RISK FACTORS: "Do you have any history of heart problems or risk factors for heart disease?" (e.g., angina, prior heart attack; diabetes, high blood pressure, high cholesterol, smoker, or strong family history of heart disease)     CVA 8. PULMONARY RISK FACTORS: "Do you have any history of lung disease?"  (e.g., blood clots in lung, asthma, emphysema, birth control pills)     No 9. CAUSE: "What do you think is causing the chest pain?"     Unsure 10. OTHER SYMPTOMS: "Do you have any other symptoms?" (e.g.,  dizziness, nausea, vomiting, sweating, fever, difficulty breathing, cough)       No 11. PREGNANCY: "Is there any chance you are pregnant?" "When was your last menstrual period?"       n/a  Protocols used: CHEST PAIN-A-AH

## 2020-04-21 ENCOUNTER — Other Ambulatory Visit: Payer: Self-pay

## 2020-04-21 ENCOUNTER — Ambulatory Visit (INDEPENDENT_AMBULATORY_CARE_PROVIDER_SITE_OTHER): Payer: Self-pay | Admitting: Internal Medicine

## 2020-04-21 ENCOUNTER — Encounter: Payer: Self-pay | Admitting: Internal Medicine

## 2020-04-21 VITALS — BP 136/88 | HR 68 | Temp 98.0°F | Ht 72.0 in | Wt 210.0 lb

## 2020-04-21 DIAGNOSIS — G5642 Causalgia of left upper limb: Secondary | ICD-10-CM

## 2020-04-21 DIAGNOSIS — J0100 Acute maxillary sinusitis, unspecified: Secondary | ICD-10-CM

## 2020-04-21 DIAGNOSIS — R0789 Other chest pain: Secondary | ICD-10-CM

## 2020-04-21 MED ORDER — AZITHROMYCIN 250 MG PO TABS: ORAL_TABLET | ORAL | 0 refills | Status: AC

## 2020-04-21 NOTE — Progress Notes (Signed)
Date:  04/21/2020   Name:  Jeffrey Stokes   DOB:  04-21-1961   MRN:  [id number]   Chief Complaint: Chest Pain (X5 months on and off, heart has been hurting,feels like fluid is in ears, cold air makes it hard to breath,)  Chest Pain  This is a recurrent problem. Episode onset: months ago. The problem occurs daily. The problem has been unchanged. The pain is present in the lateral region. The pain is mild. The quality of the pain is described as stabbing. The pain radiates to the left arm. Associated symptoms include a cough. Pertinent negatives include no abdominal pain, dizziness, fever, headaches or shortness of breath.  Otalgia  There is pain in both ears. This is a new problem. The current episode started in the past 7 days. The problem occurs constantly. The problem has been gradually improving. There has been no fever. Associated symptoms include coughing. Pertinent negatives include no abdominal pain, headaches or sore throat.    Lab Results  Component Value Date   CREATININE 0.93 05/29/2019   BUN 12 05/29/2019   NA 141 05/29/2019   K 4.4 05/29/2019   CL 105 05/29/2019   CO2 22 05/29/2019   Lab Results  Component Value Date   CHOL 207 (H) 05/29/2019   HDL 51 05/29/2019   LDLCALC 141 (H) 05/29/2019   TRIG 82 05/29/2019   CHOLHDL 4.1 05/29/2019   Lab Results  Component Value Date   TSH 1.440 10/26/2015   No results found for: HGBA1C Lab Results  Component Value Date   WBC 7.6 05/29/2019   HGB 16.3 05/29/2019   HCT 46.7 05/29/2019   MCV 102 (H) 05/29/2019   PLT 223 05/29/2019   Lab Results  Component Value Date   ALT 41 05/29/2019   AST 29 05/29/2019   ALKPHOS 86 05/29/2019   BILITOT 0.5 05/29/2019     Review of Systems  Constitutional: Positive for unexpected weight change. Negative for chills, fatigue and fever.  HENT: Positive for ear pain, postnasal drip and sinus pressure. Negative for sore throat and trouble swallowing.   Respiratory: Positive for  cough. Negative for chest tightness, shortness of breath and wheezing.   Cardiovascular: Positive for chest pain.  Gastrointestinal: Negative for abdominal pain.  Musculoskeletal: Positive for arthralgias and myalgias.  Neurological: Negative for dizziness and headaches.    Patient Active Problem List   Diagnosis Date Noted  . Gastroesophageal reflux disease 03/14/2017  . Ulnar neuritis, left 04/20/2016  . Complex regional pain syndrome type 2 of left upper extremity 02/12/2016  . Rhinitis, allergic 07/29/2015  . Benign neoplasm of ascending colon   . Benign neoplasm of transverse colon   . Benign neoplasm of descending colon   . LPRD (laryngopharyngeal reflux disease) 03/31/2015  . Panic disorder without agoraphobia 03/31/2015  . History of stroke 12/03/2014  . HSV-2 infection 12/03/2014  . Obstructive apnea 12/03/2014  . Disorder of rotator cuff 12/03/2014  . Bilateral carotid artery disease ([place]) 04/15/2014    Allergies  Allergen Reactions  . Hydrocodone Nausea Only and Anxiety  . Lyrica [Pregabalin] Other (See Comments)    "drunk feeling"    Past Surgical History:  Procedure Laterality Date  . COLONOSCOPY WITH PROPOFOL N/A 06/01/2015   Procedure: COLONOSCOPY WITH PROPOFOL;  Surgeon: [name], MD;  Location: [facility];  Service: Endoscopy;  Laterality: N/A;  ASCENDING COLON POLYP DESCENDING COLON POLYP TRANSVERSE COLON POLYP  . EYE SURGERY     Age 59   .  SALIVARY GLAND SURGERY    . SHOULDER ARTHROSCOPY Left 03/2014   bicep and nerve problems still  . TONSILLECTOMY    . UMBILICAL HERNIA REPAIR N/A 08/25/2015   Procedure: HERNIA REPAIR UMBILICAL ADULT;  Surgeon: Jules Husbands, MD;  Location: [facility];  Service: General;  Laterality: N/A;    Social History   Tobacco Use  . Smoking status: Former Smoker    Packs/day: 2.00    Years: 33.00    Pack years: 66.00    Types: Cigarettes    Start date: 06/28/1971    Quit date: 12/25/2005    Years since quitting:  14.3  . Smokeless tobacco: Former Systems developer    Types: Chew  Substance Use Topics  . Alcohol use: No    Alcohol/week: 0.0 standard drinks  . Drug use: No     Medication list has been reviewed and updated.  Current Meds  Medication Sig  . Ascorbic Acid (VITAMIN C PO) Take 1 tablet by mouth daily. Reported on 09/23/2015  . aspirin EC 325 MG EC tablet Take 1 tablet (325 mg total) by mouth daily. (Patient taking differently: Take 81 mg by mouth 2 (two) times daily. )  . cetirizine (ZYRTEC) 10 MG tablet Take 10 mg by mouth at bedtime.   . Cholecalciferol (VITAMIN D PO) Take 1 tablet by mouth daily. Reported on 09/23/2015  . Cyanocobalamin (B-12 PO) Take 1 tablet by mouth daily. Reported on 09/23/2015  . famotidine (PEPCID) 20 MG tablet Take 20 mg by mouth daily.  . Multiple Vitamins-Minerals (MULTIVITAMIN ADULT PO) Take 1 tablet by mouth daily. Reported on 09/23/2015  . Multiple Vitamins-Minerals (ZINC PO) Take 1 tablet by mouth.  . valACYclovir (VALTREX) 1000 MG tablet Take 1 tablet (1,000 mg total) by mouth daily.    PHQ 2/9 Scores 12/11/2019 05/29/2019 09/07/2018 03/14/2017  PHQ - 2 Score 0 0 0 3  PHQ- 9 Score 0 0 - 9    GAD 7 : Generalized Anxiety Score 12/11/2019 07/29/2015  Nervous, Anxious, on Edge 1 3  Control/stop worrying 0 3  Worry too much - different things 0 3  Trouble relaxing 0 3  Restless 0 0  Easily annoyed or irritable 0 3  Afraid - awful might happen 0 3  Total GAD 7 Score 1 18  Anxiety Difficulty Not difficult at all Extremely difficult    BP Readings from Last 3 Encounters:  04/21/20 136/88  12/11/19 102/64  05/29/19 132/78    Physical Exam Vitals and nursing note reviewed.  Constitutional:      General: He is not in acute distress.    Appearance: He is well-developed.  HENT:     Head: Normocephalic and atraumatic.     Right Ear: Tympanic membrane is retracted.     Left Ear: Tympanic membrane normal.     Nose:     Right Sinus: Maxillary sinus tenderness  present.     Left Sinus: Maxillary sinus tenderness present.  Neck:     Thyroid: No thyromegaly.  Cardiovascular:     Heart sounds: Normal heart sounds.   No systolic murmur is present.  No friction rub. No gallop.   Pulmonary:     Effort: Pulmonary effort is normal. No respiratory distress.     Breath sounds: Normal breath sounds.  Chest:     Chest wall: No mass or tenderness.     Breasts:        Right: Normal.        Left: Normal.  Musculoskeletal:        General: Normal range of motion.  Skin:    General: Skin is warm and dry.     Findings: No rash.  Neurological:     Mental Status: He is alert and oriented to person, place, and time.     Comments: Very painful to light touch left upper back, left side of neck, shoulder and left arm  Psychiatric:        Behavior: Behavior normal.        Thought Content: Thought content normal.     Wt Readings from Last 3 Encounters:  04/21/20 210 lb (95.3 kg)  12/11/19 202 lb (91.6 kg)  05/29/19 200 lb 9.6 oz (91 kg)    BP 136/88 (BP Location: Left Arm, Patient Position: Sitting)   Pulse 68   Temp 98 F (36.7 C) (Oral)   Ht 6' (1.829 m)   Wt 210 lb (95.3 kg)   SpO2 97%   BMI 28.48 kg/m   Assessment and Plan: 1. Atypical chest pain Suspect this is related to CRPS - EKG 12-Lead - SB @ 85; RSR' in V1 old  2. Acute non-recurrent maxillary sinusitis - azithromycin (ZITHROMAX Z-PAK) 250 MG tablet; UAD  Dispense: 6 each; Refill: 0  3. Complex regional pain syndrome type 2 of left upper extremity Chronic severe pain in the left arm On SSD awaiting insurance coverage   Partially dictated using Editor, commissioning. Any errors are unintentional.  Halina Maidens, MD [facility]  04/21/2020

## 2020-06-18 ENCOUNTER — Encounter: Payer: Self-pay | Admitting: Internal Medicine

## 2020-06-18 ENCOUNTER — Other Ambulatory Visit: Payer: Self-pay

## 2020-06-18 ENCOUNTER — Ambulatory Visit (INDEPENDENT_AMBULATORY_CARE_PROVIDER_SITE_OTHER): Payer: Self-pay | Admitting: Internal Medicine

## 2020-06-18 VITALS — BP 112/64 | HR 64 | Temp 98.0°F | Ht 72.0 in | Wt 214.0 lb

## 2020-06-18 DIAGNOSIS — Z1211 Encounter for screening for malignant neoplasm of colon: Secondary | ICD-10-CM

## 2020-06-18 DIAGNOSIS — Z125 Encounter for screening for malignant neoplasm of prostate: Secondary | ICD-10-CM

## 2020-06-18 DIAGNOSIS — K219 Gastro-esophageal reflux disease without esophagitis: Secondary | ICD-10-CM

## 2020-06-18 DIAGNOSIS — Z0000 Encounter for general adult medical examination without abnormal findings: Secondary | ICD-10-CM

## 2020-06-18 DIAGNOSIS — I6523 Occlusion and stenosis of bilateral carotid arteries: Secondary | ICD-10-CM

## 2020-06-18 NOTE — Progress Notes (Signed)
Date:  06/18/2020   Name:  Jeffrey Stokes   DOB:  1961-06-03   MRN:  [id number]   Chief Complaint: Annual Exam  Jeffrey Stokes is a 58 y.o. male who presents today for his Complete Annual Exam. He feels well. He reports exercising biking in home. He reports he is sleeping well.   Colonoscopy: 05/2015 repeat 5 yrs ( he has no insurance now but will go on medicare 07/2020)  Immunization History  Administered Date(s) Administered  . Influenza,inj,Quad PF,6+ Mos 03/14/2017, 05/04/2018  . Influenza-Unspecified 03/27/2015    Gastroesophageal Reflux He complains of chest pain. He reports no abdominal pain, no choking, no heartburn or no wheezing. not sure if the chest pain is gerd. This is a recurrent problem. The problem occurs frequently. Pertinent negatives include no fatigue. He has tried a histamine-2 antagonist for the symptoms. The treatment provided significant relief.    Lab Results  Component Value Date   CREATININE 0.93 05/29/2019   BUN 12 05/29/2019   NA 141 05/29/2019   K 4.4 05/29/2019   CL 105 05/29/2019   CO2 22 05/29/2019   Lab Results  Component Value Date   CHOL 207 (H) 05/29/2019   HDL 51 05/29/2019   LDLCALC 141 (H) 05/29/2019   TRIG 82 05/29/2019   CHOLHDL 4.1 05/29/2019   Lab Results  Component Value Date   TSH 1.440 10/26/2015   No results found for: HGBA1C Lab Results  Component Value Date   WBC 7.6 05/29/2019   HGB 16.3 05/29/2019   HCT 46.7 05/29/2019   MCV 102 (H) 05/29/2019   PLT 223 05/29/2019   Lab Results  Component Value Date   ALT 41 05/29/2019   AST 29 05/29/2019   ALKPHOS 86 05/29/2019   BILITOT 0.5 05/29/2019     Review of Systems  Constitutional: Negative for appetite change, chills, diaphoresis, fatigue and unexpected weight change.  HENT: Positive for hearing loss. Negative for tinnitus, trouble swallowing and voice change.   Eyes: Negative for visual disturbance.  Respiratory: Positive for shortness of breath. Negative  for choking and wheezing.   Cardiovascular: Positive for chest pain. Negative for palpitations and leg swelling.  Gastrointestinal: Negative for abdominal pain, blood in stool, constipation, diarrhea and heartburn.  Genitourinary: Positive for frequency. Negative for difficulty urinating, dysuria, hematuria, penile discharge and testicular pain.  Musculoskeletal: Negative for arthralgias, back pain and myalgias.  Skin: Negative for color change and rash.  Neurological: Negative for dizziness, focal weakness, syncope and headaches.  Hematological: Negative for adenopathy.  Psychiatric/Behavioral: Negative for dysphoric mood and sleep disturbance. The patient is nervous/anxious.     Patient Active Problem List   Diagnosis Date Noted  . Gastroesophageal reflux disease 03/14/2017  . Ulnar neuritis, left 04/20/2016  . Complex regional pain syndrome type 2 of left upper extremity 02/12/2016  . Rhinitis, allergic 07/29/2015  . LPRD (laryngopharyngeal reflux disease) 03/31/2015  . Panic disorder without agoraphobia 03/31/2015  . History of stroke 12/03/2014  . HSV-2 infection 12/03/2014  . Obstructive apnea 12/03/2014  . Disorder of rotator cuff 12/03/2014  . Carotid artery stenosis 04/15/2014    Allergies  Allergen Reactions  . Hydrocodone Nausea Only and Anxiety  . Lyrica [Pregabalin] Other (See Comments)    "drunk feeling"    Past Surgical History:  Procedure Laterality Date  . COLONOSCOPY WITH PROPOFOL N/A 06/01/2015   Procedure: COLONOSCOPY WITH PROPOFOL;  Surgeon: [name], MD;  Location: [facility];  Service: Endoscopy;  Laterality:  N/A;  ASCENDING COLON POLYP DESCENDING COLON POLYP TRANSVERSE COLON POLYP  . EYE SURGERY     Age 53   . SALIVARY GLAND SURGERY    . SHOULDER ARTHROSCOPY Left 03/2014   bicep and nerve problems still  . TONSILLECTOMY    . UMBILICAL HERNIA REPAIR N/A 08/25/2015   Procedure: HERNIA REPAIR UMBILICAL ADULT;  Surgeon: Leafy Ro, MD;   Location: [facility];  Service: General;  Laterality: N/A;    Social History   Tobacco Use  . Smoking status: Former Smoker    Packs/day: 2.00    Years: 33.00    Pack years: 66.00    Types: Cigarettes    Start date: 06/28/1971    Quit date: 12/25/2005    Years since quitting: 14.4  . Smokeless tobacco: Former Neurosurgeon    Types: Chew  Substance Use Topics  . Alcohol use: No    Alcohol/week: 0.0 standard drinks  . Drug use: No     Medication list has been reviewed and updated.  Current Meds  Medication Sig  . Ascorbic Acid (VITAMIN C PO) Take 1 tablet by mouth daily. Reported on 09/23/2015  . aspirin EC 325 MG EC tablet Take 1 tablet (325 mg total) by mouth daily. (Patient taking differently: Take 81 mg by mouth 2 (two) times daily.)  . cetirizine (ZYRTEC) 10 MG tablet Take 10 mg by mouth at bedtime.   . Cholecalciferol (VITAMIN D PO) Take 1 tablet by mouth daily. Reported on 09/23/2015  . Cyanocobalamin (B-12 PO) Take 1 tablet by mouth daily. Reported on 09/23/2015  . famotidine (PEPCID) 20 MG tablet Take 20 mg by mouth daily.  . Multiple Vitamins-Minerals (MULTIVITAMIN ADULT PO) Take 1 tablet by mouth daily. Reported on 09/23/2015  . Multiple Vitamins-Minerals (ZINC PO) Take 1 tablet by mouth.  . valACYclovir (VALTREX) 1000 MG tablet Take 1 tablet (1,000 mg total) by mouth daily.    PHQ 2/9 Scores 06/18/2020 12/11/2019 05/29/2019 09/07/2018  PHQ - 2 Score 0 0 0 0  PHQ- 9 Score 0 0 0 -    GAD 7 : Generalized Anxiety Score 06/18/2020 12/11/2019 07/29/2015  Nervous, Anxious, on Edge 0 1 3  Control/stop worrying 0 0 3  Worry too much - different things 0 0 3  Trouble relaxing 0 0 3  Restless 0 0 0  Easily annoyed or irritable 0 0 3  Afraid - awful might happen 0 0 3  Total GAD 7 Score 0 1 18  Anxiety Difficulty - Not difficult at all Extremely difficult    BP Readings from Last 3 Encounters:  06/18/20 112/64  04/21/20 136/88  12/11/19 102/64    Physical Exam Vitals and nursing  note reviewed.  Constitutional:      Appearance: Normal appearance. He is well-developed.  HENT:     Head: Normocephalic.     Right Ear: Tympanic membrane, ear canal and external ear normal. Decreased hearing noted.     Left Ear: Tympanic membrane, ear canal and external ear normal. Decreased hearing noted.     Nose: Nose normal.  Eyes:     Conjunctiva/sclera: Conjunctivae normal.     Pupils: Pupils are equal, round, and reactive to light.  Neck:     Thyroid: No thyromegaly.     Vascular: No carotid bruit.  Cardiovascular:     Rate and Rhythm: Normal rate and regular rhythm.     Pulses: Normal pulses.     Heart sounds: Normal heart sounds.  Pulmonary:  Effort: Pulmonary effort is normal.     Breath sounds: Normal breath sounds. No wheezing.  Chest:  Breasts:     Right: No mass.     Left: No mass.    Abdominal:     General: Bowel sounds are normal.     Palpations: Abdomen is soft.     Tenderness: There is no abdominal tenderness.  Musculoskeletal:        General: Normal range of motion.     Cervical back: Normal range of motion and neck supple.     Right lower leg: No edema.     Left lower leg: No edema.  Lymphadenopathy:     Cervical: No cervical adenopathy.  Skin:    General: Skin is warm and dry.     Capillary Refill: Capillary refill takes less than 2 seconds.          Comments: Macular rash c/w tinea  Neurological:     General: No focal deficit present.     Mental Status: He is alert and oriented to person, place, and time.     Deep Tendon Reflexes: Reflexes are normal and symmetric.  Psychiatric:        Attention and Perception: Attention normal.        Mood and Affect: Mood normal.     Wt Readings from Last 3 Encounters:  06/18/20 214 lb (97.1 kg)  04/21/20 210 lb (95.3 kg)  12/11/19 202 lb (91.6 kg)    BP 112/64   Pulse 64   Temp 98 F (36.7 C) (Oral)   Ht 6' (1.829 m)   Wt 214 lb (97.1 kg)   SpO2 95%   BMI 29.02 kg/m   Assessment and  Plan: 1. Annual physical exam Exam is normal except for weight. Encourage regular exercise and appropriate dietary changes - he is cutting back on portions. - CBC with Differential/Platelet - Comprehensive metabolic panel  2. Colon cancer screening Postponed until 07/2020 when insurance goes into effect  3. Prostate cancer screening DRE deferred - PSA  4. Bilateral carotid artery stenosis Check lipids; exam negative for bruit <50% bilateral noted in 2018  Would like to repeat next visit - Lipid panel  5. Gastroesophageal reflux disease without esophagitis Uncontrolled gerd may be the source of his atypical chest pain Stop Pepcid and try either nexium, omeprazole or prevacid otc.   Partially dictated using Editor, commissioning. Any errors are unintentional.  Halina Maidens, MD [facility]  06/18/2020

## 2020-06-18 NOTE — Patient Instructions (Signed)
For reflux - better than Pepcid is Nexium or Omeprazole or Prevacid.

## 2020-06-19 LAB — CBC WITH DIFFERENTIAL/PLATELET
Basophils Absolute: 0 10*3/uL (ref 0.0–0.2)
Basos: 0 %
EOS (ABSOLUTE): 0.1 10*3/uL (ref 0.0–0.4)
Eos: 1 %
Hematocrit: 46.3 % (ref 37.5–51.0)
Hemoglobin: 16.5 g/dL (ref 13.0–17.7)
Immature Grans (Abs): 0 10*3/uL (ref 0.0–0.1)
Immature Granulocytes: 1 %
Lymphocytes Absolute: 2.4 10*3/uL (ref 0.7–3.1)
Lymphs: 32 %
MCH: 35.7 pg — ABNORMAL HIGH (ref 26.6–33.0)
MCHC: 35.6 g/dL (ref 31.5–35.7)
MCV: 100 fL — ABNORMAL HIGH (ref 79–97)
Monocytes Absolute: 0.9 10*3/uL (ref 0.1–0.9)
Monocytes: 12 %
Neutrophils Absolute: 4 10*3/uL (ref 1.4–7.0)
Neutrophils: 54 %
Platelets: 248 10*3/uL (ref 150–450)
RBC: 4.62 x10E6/uL (ref 4.14–5.80)
RDW: 12.9 % (ref 11.6–15.4)
WBC: 7.4 10*3/uL (ref 3.4–10.8)

## 2020-06-19 LAB — COMPREHENSIVE METABOLIC PANEL
ALT: 51 IU/L — ABNORMAL HIGH (ref 0–44)
AST: 35 IU/L (ref 0–40)
Albumin/Globulin Ratio: 2 (ref 1.2–2.2)
Albumin: 4.7 g/dL (ref 3.8–4.9)
Alkaline Phosphatase: 83 IU/L (ref 44–121)
BUN/Creatinine Ratio: 9 (ref 9–20)
BUN: 10 mg/dL (ref 6–24)
Bilirubin Total: 0.5 mg/dL (ref 0.0–1.2)
CO2: 22 mmol/L (ref 20–29)
Calcium: 9.4 mg/dL (ref 8.7–10.2)
Chloride: 106 mmol/L (ref 96–106)
Creatinine, Ser: 1.06 mg/dL (ref 0.76–1.27)
GFR calc Af Amer: 90 mL/min/{1.73_m2} (ref >59)
GFR calc non Af Amer: 78 mL/min/{1.73_m2} (ref >59)
Globulin, Total: 2.3 g/dL (ref 1.5–4.5)
Glucose: 106 mg/dL — ABNORMAL HIGH (ref 65–99)
Potassium: 4.5 mmol/L (ref 3.5–5.2)
Sodium: 141 mmol/L (ref 134–144)
Total Protein: 7 g/dL (ref 6.0–8.5)

## 2020-06-19 LAB — LIPID PANEL
Chol/HDL Ratio: 5.1 ratio — ABNORMAL HIGH (ref 0.0–5.0)
Cholesterol, Total: 219 mg/dL — ABNORMAL HIGH (ref 100–199)
HDL: 43 mg/dL (ref >39)
LDL Chol Calc (NIH): 154 mg/dL — ABNORMAL HIGH (ref 0–99)
Triglycerides: 121 mg/dL (ref 0–149)
VLDL Cholesterol Cal: 22 mg/dL (ref 5–40)

## 2020-06-19 LAB — PSA: Prostate Specific Ag, Serum: 0.5 ng/mL (ref 0.0–4.0)

## 2020-07-06 ENCOUNTER — Encounter: Payer: Self-pay | Admitting: Internal Medicine

## 2020-12-18 ENCOUNTER — Encounter: Payer: Self-pay | Admitting: Internal Medicine

## 2020-12-18 ENCOUNTER — Other Ambulatory Visit: Payer: Self-pay

## 2020-12-18 ENCOUNTER — Ambulatory Visit (INDEPENDENT_AMBULATORY_CARE_PROVIDER_SITE_OTHER): Payer: Medicare HMO | Admitting: Internal Medicine

## 2020-12-18 VITALS — BP 110/72 | HR 66 | Temp 98.2°F | Ht 72.0 in | Wt 201.0 lb

## 2020-12-18 DIAGNOSIS — B009 Herpesviral infection, unspecified: Secondary | ICD-10-CM | POA: Diagnosis not present

## 2020-12-18 DIAGNOSIS — G5642 Causalgia of left upper limb: Secondary | ICD-10-CM

## 2020-12-18 DIAGNOSIS — Z1211 Encounter for screening for malignant neoplasm of colon: Secondary | ICD-10-CM

## 2020-12-18 DIAGNOSIS — R7989 Other specified abnormal findings of blood chemistry: Secondary | ICD-10-CM | POA: Diagnosis not present

## 2020-12-18 DIAGNOSIS — K219 Gastro-esophageal reflux disease without esophagitis: Secondary | ICD-10-CM

## 2020-12-18 DIAGNOSIS — R1032 Left lower quadrant pain: Secondary | ICD-10-CM

## 2020-12-18 DIAGNOSIS — G8929 Other chronic pain: Secondary | ICD-10-CM | POA: Diagnosis not present

## 2020-12-18 DIAGNOSIS — I779 Disorder of arteries and arterioles, unspecified: Secondary | ICD-10-CM

## 2020-12-18 MED ORDER — VALACYCLOVIR HCL 1 G PO TABS: 1000.0000 mg | ORAL_TABLET | Freq: Every day | ORAL | 12 refills | Status: DC

## 2020-12-18 NOTE — Progress Notes (Signed)
Date:  12/18/2020   Name:  Jeffrey Stokes   DOB:  08/03/1960   MRN:  [id number]   Chief Complaint: Gastroesophageal Reflux, Flank Pain (Left side pain after hernia repair ), nerves in eye  (X2-3 weeks, twitches out of the blue ), and Joint Pain (X6 months, In fingers, finger lock up sometimes and feels numb at times)  Gastroesophageal Reflux He complains of abdominal pain (left groin discomfort) and heartburn. He reports no chest pain, no nausea or no wheezing. This is a recurrent (since last year) problem. The problem occurs occasionally. Pertinent negatives include no fatigue. He has tried a histamine-2 antagonist (advised to try PPI but did not - just changed diet) for the symptoms.  Elevated liver function test - mild increase in ALT in December. He does not drink and he does not take high doses of acetaminophen. HSV - on Valtrex daily for suppression.  He is due for a refill.  He thinks the medication may be causing some irritation of his penis.  He has not had an episode in about 18 months. Joint pain in fingers - finger locks up and sometimes feels numb.  He has CRPS on the left and he thinks this is a progression. He is not on any medication - nothing he tried in the past was helpful. Lab Results  Component Value Date   CREATININE 1.06 06/18/2020   BUN 10 06/18/2020   NA 141 06/18/2020   K 4.5 06/18/2020   CL 106 06/18/2020   CO2 22 06/18/2020   Lab Results  Component Value Date   CHOL 219 (H) 06/18/2020   HDL 43 06/18/2020   LDLCALC 154 (H) 06/18/2020   TRIG 121 06/18/2020   CHOLHDL 5.1 (H) 06/18/2020   Lab Results  Component Value Date   TSH 1.440 10/26/2015   No results found for: HGBA1C Lab Results  Component Value Date   WBC 7.4 06/18/2020   HGB 16.5 06/18/2020   HCT 46.3 06/18/2020   MCV 100 (H) 06/18/2020   PLT 248 06/18/2020   Lab Results  Component Value Date   ALT 51 (H) 06/18/2020   AST 35 06/18/2020   ALKPHOS 83 06/18/2020   BILITOT 0.5 06/18/2020      Review of Systems  Constitutional:  Positive for unexpected weight change (has lost 14 lbs with diet changes). Negative for appetite change, diaphoresis, fatigue and fever.  Respiratory:  Negative for chest tightness, shortness of breath and wheezing.   Cardiovascular:  Negative for chest pain, palpitations and leg swelling.  Gastrointestinal:  Positive for abdominal pain (left groin discomfort) and heartburn. Negative for constipation, diarrhea and nausea.  Genitourinary:  Negative for dysuria, frequency and genital sores.  Musculoskeletal:  Positive for arthralgias.  Neurological:  Positive for numbness. Negative for dizziness and headaches.  Psychiatric/Behavioral:  Positive for sleep disturbance. Negative for dysphoric mood. The patient is not nervous/anxious.    Patient Active Problem List   Diagnosis Date Noted   Gastroesophageal reflux disease 03/14/2017   Ulnar neuritis, left 04/20/2016   Complex regional pain syndrome type 2 of left upper extremity 02/12/2016   Rhinitis, allergic 07/29/2015   LPRD (laryngopharyngeal reflux disease) 03/31/2015   Panic disorder without agoraphobia 03/31/2015   History of stroke 12/03/2014   HSV-2 infection 12/03/2014   Obstructive apnea 12/03/2014   Disorder of rotator cuff 12/03/2014   Carotid artery stenosis 04/15/2014    Allergies  Allergen Reactions   Hydrocodone Nausea Only and Anxiety   Lyrica [  Pregabalin] Other (See Comments)    "drunk feeling"    Past Surgical History:  Procedure Laterality Date   COLONOSCOPY WITH PROPOFOL N/A 06/01/2015   Procedure: COLONOSCOPY WITH PROPOFOL;  Surgeon: [name], MD;  Location: [facility];  Service: Endoscopy;  Laterality: N/A;  ASCENDING COLON POLYP DESCENDING COLON POLYP TRANSVERSE COLON POLYP   EYE SURGERY     Age 60    SALIVARY GLAND SURGERY     SHOULDER ARTHROSCOPY Left 03/2014   bicep and nerve problems still   TONSILLECTOMY     UMBILICAL HERNIA REPAIR N/A 08/25/2015    Procedure: HERNIA REPAIR UMBILICAL ADULT;  Surgeon: Jules Husbands, MD;  Location: [facility];  Service: General;  Laterality: N/A;    Social History   Tobacco Use   Smoking status: Former    Packs/day: 2.00    Years: 33.00    Pack years: 66.00    Types: Cigarettes    Start date: 06/28/1971    Quit date: 12/25/2005    Years since quitting: 14.9   Smokeless tobacco: Former    Types: Chew  Substance Use Topics   Alcohol use: No    Alcohol/week: 0.0 standard drinks   Drug use: No     Medication list has been reviewed and updated.  Current Meds  Medication Sig   Ascorbic Acid (VITAMIN C PO) Take 1 tablet by mouth daily. Reported on 09/23/2015   aspirin EC 325 MG EC tablet Take 1 tablet (325 mg total) by mouth daily. (Patient taking differently: Take 81 mg by mouth 2 (two) times daily.)   cetirizine (ZYRTEC) 10 MG tablet Take 10 mg by mouth at bedtime.    Cholecalciferol (VITAMIN D PO) Take 1 tablet by mouth daily. Reported on 09/23/2015   Cyanocobalamin (B-12 PO) Take 1 tablet by mouth daily. Reported on 09/23/2015   famotidine (PEPCID) 20 MG tablet Take 20 mg by mouth daily.   Multiple Vitamins-Minerals (MULTIVITAMIN ADULT PO) Take 1 tablet by mouth daily. Reported on 09/23/2015   Multiple Vitamins-Minerals (ZINC PO) Take 1 tablet by mouth.   valACYclovir (VALTREX) 1000 MG tablet Take 1 tablet (1,000 mg total) by mouth daily.    PHQ 2/9 Scores 12/18/2020 06/18/2020 12/11/2019 05/29/2019  PHQ - 2 Score 0 0 0 0  PHQ- 9 Score 0 0 0 0    GAD 7 : Generalized Anxiety Score 12/18/2020 06/18/2020 12/11/2019 07/29/2015  Nervous, Anxious, on Edge 0 0 1 3  Control/stop worrying 0 0 0 3  Worry too much - different things 0 0 0 3  Trouble relaxing 0 0 0 3  Restless 0 0 0 0  Easily annoyed or irritable 0 0 0 3  Afraid - awful might happen 0 0 0 3  Total GAD 7 Score 0 0 1 18  Anxiety Difficulty Not difficult at all - Not difficult at all Extremely difficult    BP Readings from Last 3  Encounters:  12/18/20 110/72  06/18/20 112/64  04/21/20 136/88    Physical Exam Vitals and nursing note reviewed.  Constitutional:      General: He is not in acute distress.    Appearance: He is well-developed.  HENT:     Head: Normocephalic and atraumatic.  Cardiovascular:     Rate and Rhythm: Normal rate and regular rhythm.     Heart sounds: No murmur heard. Pulmonary:     Effort: Pulmonary effort is normal. No respiratory distress.     Breath sounds: No wheezing or rhonchi.  Abdominal:     General: Abdomen is flat.     Palpations: Abdomen is soft.     Hernia: A hernia is present. Hernia is present in the left inguinal area (mild left sided fullness and discomfort without definite hernia).  Musculoskeletal:     Left elbow: No swelling. Tenderness (medial elbow) present.     Cervical back: Spasms and tenderness present. Pain with movement present.     Right lower leg: No edema.     Left lower leg: No edema.     Comments: Mild contractures of left hand Grip reduced 4/5 on left  Lymphadenopathy:     Cervical: No cervical adenopathy.  Skin:    General: Skin is warm and dry.     Findings: No rash.  Neurological:     Mental Status: He is alert and oriented to person, place, and time.  Psychiatric:        Mood and Affect: Mood normal.        Behavior: Behavior normal.    Wt Readings from Last 3 Encounters:  12/18/20 201 lb (91.2 kg)  06/18/20 214 lb (97.1 kg)  04/21/20 210 lb (95.3 kg)    BP 110/72   Pulse 66   Temp 98.2 F (36.8 C) (Oral)   Ht 6' (1.829 m)   Wt 201 lb (91.2 kg)   SpO2 96%   BMI 27.26 kg/m   Assessment and Plan: 1. Gastroesophageal reflux disease without esophagitis Symptoms well controlled on daily Pepcid No red flag signs such as weight loss, n/v, melena  2. HSV-2 infection Can hold Valtrex to see if penile irritation resolves; then just use Valtrex prn - valACYclovir (VALTREX) 1000 MG tablet; Take 1 tablet (1,000 mg total) by mouth  daily.  Dispense: 30 tablet; Refill: 12  3. Elevated liver function tests Will recheck next visit  4. Groin pain, chronic, left No definite hernia He declines General surgery referral at this time - he will call if sx worsen  5. Complex regional pain syndrome type 2 of left upper extremity Slowly progressive LUE sx. He has failed to benefit from multiple agents. He is on disability due to this condition  6. Bilateral carotid artery disease, unspecified type ([place]) Noted on [place] to be < 50% Continue dietary changes to reduce cholesterol and weight  7. Colon cancer screening Overdue for 5 yr colonoscopy - Ambulatory referral to Gastroenterology    Partially dictated using Dragon software. Any errors are unintentional.  Halina Maidens, MD [facility]  12/18/2020

## 2020-12-18 NOTE — Patient Instructions (Signed)
You can stop taking Valtrex daily.  If you have a herpes outbreak, you can take Valtrex 1 tablets twice a day for one day.

## 2021-01-18 ENCOUNTER — Telehealth (INDEPENDENT_AMBULATORY_CARE_PROVIDER_SITE_OTHER): Payer: Self-pay | Admitting: Gastroenterology

## 2021-01-18 DIAGNOSIS — Z86010 Personal history of colonic polyps: Secondary | ICD-10-CM

## 2021-01-18 MED ORDER — PEG 3350-KCL-NA BICARB-NACL 420 G PO SOLR: 4000.0000 mL | Freq: Once | ORAL | 0 refills | Status: AC

## 2021-01-18 NOTE — Progress Notes (Signed)
Gastroenterology Pre-Procedure Review  Request Date: 02/19/21 Requesting Physician: Dr. Allen Norris  PATIENT REVIEW QUESTIONS: The patient responded to the following health history questions as indicated:    1. Are you having any GI issues? yes (hemorrhoids (3)) 2. Do you have a personal history of Polyps? yes (06/01/2015 ) 3. Do you have a family history of Colon Cancer or Polyps? no 4. Diabetes Mellitus? no 5. Joint replacements in the past 12 months?no 6. Major health problems in the past 3 months?no 7. Any artificial heart valves, MVP, or defibrillator?no    MEDICATIONS & ALLERGIES:    Patient reports the following regarding taking any anticoagulation/antiplatelet therapy:   Plavix, Coumadin, Eliquis, Xarelto, Lovenox, Pradaxa, Brilinta, or Effient? no Aspirin? yes (325 mg. Informed patient to stop 3 days before procedure.)  Patient confirms/reports the following medications:  Current Outpatient Medications  Medication Sig Dispense Refill   Ascorbic Acid (VITAMIN C PO) Take 1 tablet by mouth daily. Reported on 09/23/2015     aspirin EC 325 MG EC tablet Take 1 tablet (325 mg total) by mouth daily. (Patient taking differently: Take 81 mg by mouth 2 (two) times daily.) 30 tablet 0   cetirizine (ZYRTEC) 10 MG tablet Take 10 mg by mouth at bedtime.      Cholecalciferol (VITAMIN D PO) Take 1 tablet by mouth daily. Reported on 09/23/2015     Cyanocobalamin (B-12 PO) Take 1 tablet by mouth daily. Reported on 09/23/2015     famotidine (PEPCID) 20 MG tablet Take 20 mg by mouth daily.     Multiple Vitamins-Minerals (MULTIVITAMIN ADULT PO) Take 1 tablet by mouth daily. Reported on 09/23/2015     Multiple Vitamins-Minerals (ZINC PO) Take 1 tablet by mouth.     valACYclovir (VALTREX) 1000 MG tablet Take 1 tablet (1,000 mg total) by mouth daily. 30 tablet 12   No current facility-administered medications for this visit.    Patient confirms/reports the following allergies:  Allergies  Allergen  Reactions   Hydrocodone Nausea Only and Anxiety   Lyrica [Pregabalin] Other (See Comments)    "drunk feeling"    No orders of the defined types were placed in this encounter.   AUTHORIZATION INFORMATION Primary Insurance: 1D#: Group #:  Secondary Insurance: 1D#: Group #:  SCHEDULE INFORMATION: Date: 02/19/21 Time: Location: [facility]

## 2021-02-03 ENCOUNTER — Encounter (HOSPITAL_COMMUNITY): Payer: Self-pay

## 2021-02-03 ENCOUNTER — Emergency Department (HOSPITAL_COMMUNITY): Payer: Medicare HMO

## 2021-02-03 ENCOUNTER — Other Ambulatory Visit: Payer: Self-pay

## 2021-02-03 ENCOUNTER — Emergency Department (HOSPITAL_COMMUNITY)
Admission: EM | Admit: 2021-02-03 | Discharge: 2021-02-03 | Disposition: A | Discharge status: Home / Self Care | Payer: Medicare HMO | Attending: Emergency Medicine | Admitting: Emergency Medicine

## 2021-02-03 DIAGNOSIS — Z87891 Personal history of nicotine dependence: Secondary | ICD-10-CM | POA: Insufficient documentation

## 2021-02-03 DIAGNOSIS — Z79899 Other long term (current) drug therapy: Secondary | ICD-10-CM | POA: Insufficient documentation

## 2021-02-03 DIAGNOSIS — M25511 Pain in right shoulder: Secondary | ICD-10-CM | POA: Diagnosis not present

## 2021-02-03 NOTE — ED Provider Notes (Signed)
[facility] EMERGENCY DEPARTMENT Provider Note   CSN: [id number] Arrival date & time: 02/03/21  1018     History Chief Complaint  Patient presents with   Shoulder Pain    Jeffrey Stokes is a 60 y.o. male.  HPI  Patient with significant medical history of bilateral shoulder pain left shoulder surgery presents with chief complaint of right shoulder pain.  Patient states yesterday he brought his dog to the vet to have his nails clipped unfortunately the dog lunged at the provider and he had to pull the dog back.  He states he fell with his arm pop and had severe pain in his right shoulder.  He states he is unable to bring it up to shoulder level, denies  paresthesia or weakness in his fingers wrist or elbow, he denies actually falling, hitting his head, is not on anticoagulant he states he is taking some Aleve which seems to help with his pain.  He has no other complaints this time.  Does not endorse chest pain, shortness of breath, abdominal pain.  Past Medical History:  Diagnosis Date   Allergy    Anxiety    Benign neoplasm of ascending colon    Benign neoplasm of descending colon    Benign neoplasm of transverse colon    Depression    GERD (gastroesophageal reflux disease)    History of motion sickness    laying on back   HOH (hard of hearing)    bilateral   Hx-TIA (transient ischemic attack) 2012   Sleep apnea    had one sleep study, inconclusive,needs repeat   Stroke ([place])    3 strokes last one on 2012/ mouth tilt/ weakness right hand side   Wears dentures     Patient Active Problem List   Diagnosis Date Noted   Gastroesophageal reflux disease 03/14/2017   Ulnar neuritis, left 04/20/2016   Complex regional pain syndrome type 2 of left upper extremity 02/12/2016   Rhinitis, allergic 07/29/2015   LPRD (laryngopharyngeal reflux disease) 03/31/2015   Panic disorder without agoraphobia 03/31/2015   History of stroke 12/03/2014   HSV-2 infection 12/03/2014   Obstructive  apnea 12/03/2014   Disorder of rotator cuff 12/03/2014   Bilateral carotid artery disease, unspecified type [facility])     Past Surgical History:  Procedure Laterality Date   COLONOSCOPY WITH PROPOFOL N/A 06/01/2015   Procedure: COLONOSCOPY WITH PROPOFOL;  Surgeon: [name], MD;  Location: [facility];  Service: Endoscopy;  Laterality: N/A;  ASCENDING COLON POLYP DESCENDING COLON POLYP TRANSVERSE COLON POLYP   EYE SURGERY     Age 38    SALIVARY GLAND SURGERY     SHOULDER ARTHROSCOPY Left 03/2014   bicep and nerve problems still   TONSILLECTOMY     UMBILICAL HERNIA REPAIR N/A 08/25/2015   Procedure: HERNIA REPAIR UMBILICAL ADULT;  Surgeon: Jules Husbands, MD;  Location: [facility];  Service: General;  Laterality: N/A;       Family History  Problem Relation Age of Onset   Osteoarthritis Father    Stroke Father     Social History   Tobacco Use   Smoking status: Former    Packs/day: 2.00    Years: 33.00    Pack years: 66.00    Types: Cigarettes    Start date: 06/28/1971    Quit date: 12/25/2005    Years since quitting: 15.1   Smokeless tobacco: Former    Types: Chew  Substance Use Topics   Alcohol use: No  Alcohol/week: 0.0 standard drinks   Drug use: No    Home Medications Prior to Admission medications   Medication Sig Start Date End Date Taking? Authorizing Provider  Ascorbic Acid (VITAMIN C PO) Take 1 tablet by mouth daily. Reported on 09/23/2015   Yes [provider]  aspirin EC 325 MG EC tablet Take 1 tablet (325 mg total) by mouth daily. Patient taking differently: Take 81 mg by mouth daily. 07/03/16  Yes Fritzi Mandes, MD  cetirizine (ZYRTEC) 10 MG tablet Take 10 mg by mouth at bedtime.    Yes [provider]  Cholecalciferol (VITAMIN D PO) Take 1 tablet by mouth daily. Reported on 09/23/2015   Yes [provider]  Cyanocobalamin (B-12 PO) Take 1 tablet by mouth daily. Reported on 09/23/2015   Yes [provider]  famotidine  (PEPCID) 20 MG tablet Take 20 mg by mouth daily.   Yes [provider]  Multiple Vitamins-Minerals (MULTIVITAMIN ADULT PO) Take 1 tablet by mouth daily. Reported on 09/23/2015   Yes [provider]  Multiple Vitamins-Minerals (ZINC PO) Take 1 tablet by mouth.   Yes [provider]  valACYclovir (VALTREX) 1000 MG tablet Take 1 tablet (1,000 mg total) by mouth daily. 12/18/20  Yes Glean Hess, MD    Allergies    Hydrocodone and Lyrica [pregabalin]  Review of Systems   Review of Systems  Constitutional:  Negative for chills and fever.  HENT:  Negative for congestion.   Respiratory:  Negative for shortness of breath.   Cardiovascular:  Negative for chest pain.  Gastrointestinal:  Negative for abdominal pain.  Genitourinary:  Negative for enuresis.  Musculoskeletal:  Negative for back pain.       Right shoulder pain.   Skin:  Negative for rash.  Neurological:  Negative for dizziness.  Hematological:  Does not bruise/bleed easily.   Physical Exam Updated Vital Signs BP 121/81 (BP Location: Right Arm)   Pulse 65   Temp 97.8 F (36.6 C) (Oral)   Resp 18   Ht 6' (1.829 m)   Wt 89.4 kg   SpO2 98%   BMI 26.72 kg/m   Physical Exam Vitals and nursing note reviewed.  Constitutional:      General: He is not in acute distress.    Appearance: Normal appearance. He is not ill-appearing or diaphoretic.  HENT:     Head: Normocephalic and atraumatic.     Nose: No congestion or rhinorrhea.  Eyes:     General:        Right eye: No discharge.        Left eye: No discharge.     Conjunctiva/sclera: Conjunctivae normal.  Pulmonary:     Breath sounds: Normal breath sounds.  Musculoskeletal:     Cervical back: Neck supple.     Right lower leg: No edema.     Left lower leg: No edema.     Comments: Patient's right shoulder was visualized no gross deformities present, he has full range of motion his fingers wrist and elbow, neurovascular fully intact, he is  unable to adductor or abductor, unable to flex/extend at the shoulder, due to severe pain.  Able to passively move the shoulder without difficulty.  He is tender to palpation along the anterior aspect of his deltoid, no gross deformities present.  Skin:    General: Skin is warm and dry.     Coloration: Skin is not jaundiced or pale.  Neurological:     Mental Status: He is  alert and oriented to person, place, and time.  Psychiatric:        Mood and Affect: Mood normal.    ED Results / Procedures / Treatments   Labs (all labs ordered are listed, but only abnormal results are displayed) Labs Reviewed - No data to display  EKG None  Radiology DG Shoulder Right  Result Date: 02/03/2021 CLINICAL DATA:  Right shoulder pain EXAM: RIGHT SHOULDER - 2+ VIEW COMPARISON:  Shoulder radiograph 01/25/2011 FINDINGS: There is no acute fracture. The humeral head is well seated within the glenoid. There is mild degenerative change of the acromioclavicular joint. The soft tissues are unremarkable. IMPRESSION: No acute fracture or dislocation. Electronically Signed   By: Valetta Mole MD   On: 02/03/2021 11:27    Procedures Procedures   Medications Ordered in ED Medications - No data to display  ED Course  I have reviewed the triage vital signs and the nursing notes.  Pertinent labs & imaging results that were available during my care of the patient were reviewed by me and considered in my medical decision making (see chart for details).    MDM Rules/Calculators/A&P                          Initial impression-patient presents with right shoulder pain.  He is alert, does not appear acute distress, vital signs reassuring.Triage obtained imaging.  Work-up- x-ray of right shoulder unremarkable  Rule out- I have low suspicion for septic arthritis as patient denies IV drug use, skin exam was performed no erythematous, edematous, warm joints noted on exam, no new heart murmur heard on exam.  Low  suspicion for fracture or dislocation as x-ray does not feel any significant findings.  Low suspicion for compartment syndrome as area was palpated it was soft to the touch, neurovascular fully intact.   Plan-   right shoulder pain - I suspect he has a partial ligament tear, will place him in a sling, recommend over-the-counter pain medications, follow-up with orthopedic surgery for further evaluation.  Vital signs have remained stable, no indication for hospital admission.    Patient given at home care as well strict return precautions.  Patient verbalized that they understood agreed to said plan.  Final Clinical Impression(s) / ED Diagnoses Final diagnoses:  Acute pain of right shoulder    Rx / DC Orders ED Discharge Orders     None        Marcello Fennel, PA-C 02/03/21 1310    Fredia Sorrow, MD 02/05/21 1615

## 2021-02-03 NOTE — ED Triage Notes (Signed)
Pt presents to ED with complaints of right shoulder pain after dog jerked away from him yesterday.

## 2021-02-03 NOTE — ED Notes (Signed)
Pt here with reports of right shoulder injury d/t pulling his dog off the groomer-dog is big and was upset and pt had to use much force to contain dog. During the struggle he felt his left shoulder pop and then has been struggling with pain and mobility issues. Pt does have a sling in place to right arm. Pt reports he can not move his arm independently.

## 2021-02-03 NOTE — Discharge Instructions (Addendum)
Suspect you strained or tore one of the ligaments in your shoulder, please keep your arm in a sling, recommend over-the-counter pain medications.   Please follow-up with orthopedic surgery for further evaluation.  Come back to the emergency department if you develop chest pain, shortness of breath, severe abdominal pain, uncontrolled nausea, vomiting, diarrhea.

## 2021-02-10 ENCOUNTER — Encounter: Payer: Self-pay | Admitting: Gastroenterology

## 2021-02-19 ENCOUNTER — Encounter: Payer: Self-pay | Admitting: Gastroenterology

## 2021-02-19 ENCOUNTER — Other Ambulatory Visit: Payer: Self-pay

## 2021-02-19 ENCOUNTER — Ambulatory Visit: Payer: Medicare HMO | Admitting: Anesthesiology

## 2021-02-19 ENCOUNTER — Ambulatory Visit
Admission: RE | Admit: 2021-02-19 | Discharge: 2021-02-19 | Disposition: A | Discharge status: Home / Self Care | Payer: Medicare HMO | Attending: Gastroenterology | Admitting: Gastroenterology

## 2021-02-19 ENCOUNTER — Encounter
Admission: RE | Disposition: A | Discharge status: Home / Self Care | Payer: Self-pay | Source: Home / Self Care | Attending: Gastroenterology

## 2021-02-19 DIAGNOSIS — Z860100 Personal history of colon polyps, unspecified: Secondary | ICD-10-CM

## 2021-02-19 DIAGNOSIS — Z79899 Other long term (current) drug therapy: Secondary | ICD-10-CM | POA: Insufficient documentation

## 2021-02-19 DIAGNOSIS — H9193 Unspecified hearing loss, bilateral: Secondary | ICD-10-CM | POA: Diagnosis not present

## 2021-02-19 DIAGNOSIS — K219 Gastro-esophageal reflux disease without esophagitis: Secondary | ICD-10-CM | POA: Diagnosis not present

## 2021-02-19 DIAGNOSIS — Z1211 Encounter for screening for malignant neoplasm of colon: Secondary | ICD-10-CM | POA: Diagnosis not present

## 2021-02-19 DIAGNOSIS — K648 Other hemorrhoids: Secondary | ICD-10-CM | POA: Diagnosis not present

## 2021-02-19 DIAGNOSIS — Z8673 Personal history of transient ischemic attack (TIA), and cerebral infarction without residual deficits: Secondary | ICD-10-CM | POA: Insufficient documentation

## 2021-02-19 DIAGNOSIS — G4730 Sleep apnea, unspecified: Secondary | ICD-10-CM | POA: Insufficient documentation

## 2021-02-19 DIAGNOSIS — K5730 Diverticulosis of large intestine without perforation or abscess without bleeding: Secondary | ICD-10-CM | POA: Diagnosis not present

## 2021-02-19 DIAGNOSIS — Z7982 Long term (current) use of aspirin: Secondary | ICD-10-CM | POA: Insufficient documentation

## 2021-02-19 DIAGNOSIS — Z91041 Radiographic dye allergy status: Secondary | ICD-10-CM | POA: Diagnosis not present

## 2021-02-19 DIAGNOSIS — K635 Polyp of colon: Secondary | ICD-10-CM | POA: Diagnosis not present

## 2021-02-19 DIAGNOSIS — Z87891 Personal history of nicotine dependence: Secondary | ICD-10-CM | POA: Insufficient documentation

## 2021-02-19 DIAGNOSIS — Z8719 Personal history of other diseases of the digestive system: Secondary | ICD-10-CM | POA: Insufficient documentation

## 2021-02-19 HISTORY — PX: COLONOSCOPY WITH PROPOFOL: SHX5780

## 2021-02-19 HISTORY — PX: POLYPECTOMY: SHX5525

## 2021-02-19 SURGERY — COLONOSCOPY WITH PROPOFOL
Anesthesia: General | Site: Rectum

## 2021-02-19 MED ORDER — PROPOFOL 10 MG/ML IV BOLUS
INTRAVENOUS | Status: DC | PRN
  Administered 2021-02-19: 40 mg via INTRAVENOUS
  Administered 2021-02-19: 120 mg via INTRAVENOUS
  Administered 2021-02-19 (×2): 40 mg via INTRAVENOUS

## 2021-02-19 MED ORDER — SODIUM CHLORIDE 0.9 % IV SOLN: INTRAVENOUS | Status: DC

## 2021-02-19 MED ORDER — ACETAMINOPHEN 160 MG/5ML PO SOLN: 325.0000 mg | ORAL | Status: DC | PRN

## 2021-02-19 MED ORDER — ONDANSETRON HCL 4 MG/2ML IJ SOLN: 4.0000 mg | Freq: Once | INTRAMUSCULAR | Status: DC | PRN

## 2021-02-19 MED ORDER — LACTATED RINGERS IV SOLN: INTRAVENOUS | Status: DC

## 2021-02-19 MED ORDER — STERILE WATER FOR IRRIGATION IR SOLN
Status: DC | PRN
  Administered 2021-02-19: .05 mL

## 2021-02-19 MED ORDER — LIDOCAINE HCL (CARDIAC) PF 100 MG/5ML IV SOSY
PREFILLED_SYRINGE | INTRAVENOUS | Status: DC | PRN
  Administered 2021-02-19: 30 mg via INTRAVENOUS

## 2021-02-19 MED ORDER — ACETAMINOPHEN 325 MG PO TABS: 325.0000 mg | ORAL_TABLET | ORAL | Status: DC | PRN

## 2021-02-19 SURGICAL SUPPLY — 7 items
FORCEPS BIOP RAD 4 LRG CAP 4 (CUTTING FORCEPS) ×3 IMPLANT
GOWN CVR UNV OPN BCK APRN NK (MISCELLANEOUS) ×4 IMPLANT
GOWN ISOL THUMB LOOP REG UNIV (MISCELLANEOUS) ×6
KIT PRC NS LF DISP ENDO (KITS) ×2 IMPLANT
KIT PROCEDURE OLYMPUS (KITS) ×3
MANIFOLD NEPTUNE II (INSTRUMENTS) ×3 IMPLANT
WATER STERILE IRR 250ML POUR (IV SOLUTION) ×3 IMPLANT

## 2021-02-19 NOTE — Op Note (Signed)
[facility] Gastroenterology Patient Name: Jeffrey Stokes Procedure Date: 02/19/2021 10:14 AM MRN: [id number] Account #: [id number] Date of Birth: 1961/03/26 Admit Type: Outpatient Age: 60 Room: [facility] OR ROOM 01 Gender: Male Note Status: Finalized Procedure:             Colonoscopy Indications:           High risk colon cancer surveillance: Personal history                         of colonic polyps Providers:             [name] MD, MD Referring MD:          Halina Maidens, MD (Referring MD) Medicines:             Propofol per Anesthesia Complications:         No immediate complications. Procedure:             Pre-Anesthesia Assessment:                        - Prior to the procedure, a History and Physical was                         performed, and patient medications and allergies were                         reviewed. The patient's tolerance of previous                         anesthesia was also reviewed. The risks and benefits                         of the procedure and the sedation options and risks                         were discussed with the patient. All questions were                         answered, and informed consent was obtained. Prior                         Anticoagulants: The patient has taken no previous                         anticoagulant or antiplatelet agents. ASA Grade                         Assessment: II - A patient with mild systemic disease.                         After reviewing the risks and benefits, the patient                         was deemed in satisfactory condition to undergo the                         procedure.  After obtaining informed consent, the colonoscope was                         passed under direct vision. Throughout the procedure,                         the patient's blood pressure, pulse, and oxygen                         saturations were monitored continuously. The                          Colonoscope was introduced through the anus and                         advanced to the the cecum, identified by appendiceal                         orifice and ileocecal valve. The colonoscopy was                         performed without difficulty. The patient tolerated                         the procedure well. The quality of the bowel                         preparation was excellent. Findings:      The perianal and digital rectal examinations were normal.      A 3 mm polyp was found in the transverse colon. The polyp was sessile.       The polyp was removed with a cold biopsy forceps. Resection and       retrieval were complete.      Non-bleeding internal hemorrhoids were found during retroflexion. The       hemorrhoids were Grade I (internal hemorrhoids that do not prolapse).      Multiple small-mouthed diverticula were found in the sigmoid colon. Impression:            - One 3 mm polyp in the transverse colon, removed with                         a cold biopsy forceps. Resected and retrieved.                        - Non-bleeding internal hemorrhoids.                        - Diverticulosis in the sigmoid colon. Recommendation:        - Discharge patient to home.                        - Resume previous diet.                        - Continue present medications.                        - Await pathology results.                        -  Repeat colonoscopy in 7 years for surveillance. Procedure Code(s):     --- Professional ---                        [phone], Colonoscopy, flexible; with biopsy, single or                         multiple Diagnosis Code(s):     --- Professional ---                        Z86.010, Personal history of colonic polyps                        K63.5, Polyp of colon CPT copyright 2019 [employer]. All rights reserved. The codes documented in this report are preliminary and upon coder review may  be revised to meet current  compliance requirements. [name] MD, MD 02/19/2021 10:37:13 AM This report has been signed electronically. Number of Addenda: 0 Note Initiated On: 02/19/2021 10:14 AM Scope Withdrawal Time: 0 hours 6 minutes 10 seconds  Total Procedure Duration: 0 hours 9 minutes 6 seconds  Estimated Blood Loss:  Estimated blood loss: none. Estimated blood loss: none.      [facility]

## 2021-02-19 NOTE — Transfer of Care (Signed)
Immediate Anesthesia Transfer of Care Note  Patient: Jeffrey Stokes  Procedure(s) Performed: COLONOSCOPY WITH PROPOFOL (Rectum) POLYPECTOMY (Rectum)  Patient Location: PACU  Anesthesia Type: General  Level of Consciousness: awake, alert  and patient cooperative  Airway and Oxygen Therapy: Patient Spontanous Breathing and Patient connected to supplemental oxygen  Post-op Assessment: Post-op Vital signs reviewed, Patient's Cardiovascular Status Stable, Respiratory Function Stable, Patent Airway and No signs of Nausea or vomiting  Post-op Vital Signs: Reviewed and stable  Complications: No notable events documented.

## 2021-02-19 NOTE — Anesthesia Preprocedure Evaluation (Signed)
Anesthesia Evaluation  Patient identified by MRN, date of birth, ID band Patient awake    Reviewed: Allergy & Precautions, H&P , NPO status , Patient's Chart, lab work & pertinent test results, reviewed documented beta blocker date and time   Airway Mallampati: I  TM Distance: >3 FB Neck ROM: full    Dental  (+) Edentulous Upper, Edentulous Lower   Pulmonary sleep apnea , former smoker,    Pulmonary exam normal breath sounds clear to auscultation       Cardiovascular Exercise Tolerance: Good negative cardio ROS Normal cardiovascular exam Rhythm:regular Rate:Normal     Neuro/Psych PSYCHIATRIC DISORDERS 3 strokes, last in 2012 with residual R sided weakness  Neuromuscular disease CVA, Residual Symptoms negative psych ROS   GI/Hepatic Neg liver ROS, GERD  ,  Endo/Other  negative endocrine ROS  Renal/GU negative Renal ROS  negative genitourinary   Musculoskeletal   Abdominal Normal abdominal exam  (+) - obese,  Abdomen: soft.    Peds  Hematology negative hematology ROS (+)   Anesthesia Other Findings Pt has had shoulder surgery and complains of L shoulder pain with difficulty lying on his L side.  Reproductive/Obstetrics negative OB ROS                             Anesthesia Physical  Anesthesia Plan  ASA: III  Anesthesia Plan: General   Post-op Pain Management:    Induction: Intravenous  PONV Risk Score and Plan: 2 and Treatment may vary due to age or medical condition and Propofol infusion  Airway Management Planned: Nasal Cannula and Natural Airway  Additional Equipment:   Intra-op Plan:   Post-operative Plan:   Informed Consent: I have reviewed the patients History and Physical, chart, labs and discussed the procedure including the risks, benefits and alternatives for the proposed anesthesia with the patient or authorized representative who has indicated his/her  understanding and acceptance.     Dental Advisory Given  Plan Discussed with: CRNA  Anesthesia Plan Comments:         Anesthesia Quick Evaluation  Patient Active Problem List   Diagnosis Date Noted  . Gastroesophageal reflux disease 03/14/2017  . Ulnar neuritis, left 04/20/2016  . Complex regional pain syndrome type 2 of left upper extremity 02/12/2016  . Rhinitis, allergic 07/29/2015  . LPRD (laryngopharyngeal reflux disease) 03/31/2015  . Panic disorder without agoraphobia 03/31/2015  . History of stroke 12/03/2014  . HSV-2 infection 12/03/2014  . Obstructive apnea 12/03/2014  . Disorder of rotator cuff 12/03/2014  . Bilateral carotid artery disease, unspecified type (HCC)     CBC Latest Ref Rng & Units 06/18/2020 05/29/2019 03/14/2017  WBC 3.4 - 10.8 x10E3/uL 7.4 7.6 7.5  Hemoglobin 13.0 - 17.7 g/dL 16.5 16.3 16.1  Hematocrit 37.5 - 51.0 % 46.3 46.7 46.1  Platelets 150 - 450 x10E3/uL 248 223 244   BMP Latest Ref Rng & Units 06/18/2020 05/29/2019 03/14/2017  Glucose 65 - 99 mg/dL 106(H) 97 92  BUN 6 - 24 mg/dL '10 12 11  '$ Creatinine 0.76 - 1.27 mg/dL 1.06 0.93 0.97  BUN/Creat Ratio 9 - '20 9 13 11  '$ Sodium 134 - 144 mmol/L 141 141 144  Potassium 3.5 - 5.2 mmol/L 4.5 4.4 4.1  Chloride 96 - 106 mmol/L 106 105 104  CO2 20 - 29 mmol/L '22 22 23  '$ Calcium 8.7 - 10.2 mg/dL 9.4 9.4 9.4    Risks and benefits of anesthesia discussed at  length, patient or surrogate demonstrates understanding. Appropriately NPO. Plan to proceed with anesthesia.  Champ Mungo, MD 02/19/21

## 2021-02-19 NOTE — Anesthesia Postprocedure Evaluation (Signed)
Anesthesia Post Note  Patient: Jeffrey Stokes  Procedure(s) Performed: COLONOSCOPY WITH PROPOFOL (Rectum) POLYPECTOMY (Rectum)     Patient location during evaluation: PACU Anesthesia Type: General Level of consciousness: awake and alert Pain management: pain level controlled Vital Signs Assessment: post-procedure vital signs reviewed and stable Respiratory status: spontaneous breathing, nonlabored ventilation, respiratory function stable and patient connected to nasal cannula oxygen Cardiovascular status: blood pressure returned to baseline and stable Postop Assessment: no apparent nausea or vomiting Anesthetic complications: no   No notable events documented.  Sinda Du

## 2021-02-19 NOTE — H&P (Signed)
Jeffrey Lame, MD [facility] [address] [place], [place] [address] Phone:[phone] Fax : [phone]  Primary Care Physician:  Jeffrey Hess, MD Primary Gastroenterologist:  Dr. Allen Stokes  Pre-Procedure History & Physical: HPI:  Jeffrey Stokes is a 60 y.o. male is here for an colonoscopy.   Past Medical History:  Diagnosis Date   Allergy    Anxiety    Benign neoplasm of ascending colon    Benign neoplasm of descending colon    Benign neoplasm of transverse colon    Depression    GERD (gastroesophageal reflux disease)    History of motion sickness    laying on back   HOH (hard of hearing)    bilateral   Hx-TIA (transient ischemic attack) 2012   Sleep apnea    had one sleep study, inconclusive,needs repeat   Stroke (Jeffrey Stokes)    3 strokes last one on 2012/ mouth tilt/ weakness right hand side   Wears dentures    full upper and lower    Past Surgical History:  Procedure Laterality Date   COLONOSCOPY WITH PROPOFOL N/A 06/01/2015   Procedure: COLONOSCOPY WITH PROPOFOL;  Surgeon: Jeffrey Lame, MD;  Location: [place];  Service: Endoscopy;  Laterality: N/A;  ASCENDING COLON POLYP DESCENDING COLON POLYP TRANSVERSE COLON POLYP   EYE SURGERY     Age 41    SALIVARY GLAND SURGERY     SHOULDER ARTHROSCOPY Left 03/2014   bicep and nerve problems still   TONSILLECTOMY     UMBILICAL HERNIA REPAIR N/A 08/25/2015   Procedure: HERNIA REPAIR UMBILICAL ADULT;  Surgeon: Jules Husbands, MD;  Location: [facility];  Service: General;  Laterality: N/A;    Prior to Admission medications   Medication Sig Start Date End Date Taking? Authorizing Provider  Ascorbic Acid (VITAMIN C PO) Take 1 tablet by mouth daily. Reported on 09/23/2015   Yes [provider]  aspirin EC 325 MG EC tablet Take 1 tablet (325 mg total) by mouth daily. Patient taking differently: Take 81 mg by mouth daily. 07/03/16  Yes Fritzi Mandes, MD  cetirizine (ZYRTEC) 10 MG tablet Take 10 mg by mouth at bedtime.     Yes [provider]  Cholecalciferol (VITAMIN D PO) Take 1 tablet by mouth daily. Reported on 09/23/2015   Yes [provider]  Cyanocobalamin (B-12 PO) Take 1 tablet by mouth daily. Reported on 09/23/2015   Yes [provider]  famotidine (PEPCID) 20 MG tablet Take 20 mg by mouth daily.   Yes [provider]  Multiple Vitamins-Minerals (MULTIVITAMIN ADULT PO) Take 1 tablet by mouth daily. Reported on 09/23/2015   Yes [provider]  Multiple Vitamins-Minerals (ZINC PO) Take 1 tablet by mouth.   Yes [provider]  valACYclovir (VALTREX) 1000 MG tablet Take 1 tablet (1,000 mg total) by mouth daily. 12/18/20  Yes Jeffrey Hess, MD    Allergies as of 01/18/2021 - Review Complete 01/18/2021  Allergen Reaction Noted   Hydrocodone Nausea Only and Anxiety 04/15/2014   Lyrica [pregabalin] Other (See Comments) 05/26/2015    Family History  Problem Relation Age of Onset   Osteoarthritis Father    Stroke Father     Social History   Socioeconomic History   Marital status: Married    Spouse name: Not on file   Number of children: Not on file   Years of education: Not on file   Highest education level: Not on file  Occupational History   Occupation:  Works for [employer]  Tobacco Use   Smoking status: Former    Packs/day: 2.00    Years: 33.00    Pack years: 66.00    Types: Cigarettes    Start date: 06/28/1971    Quit date: 12/25/2005    Years since quitting: 15.1   Smokeless tobacco: Former    Types: Nurse, children's Use: Never used  Substance and Sexual Activity   Alcohol use: No    Alcohol/week: 0.0 standard drinks   Drug use: No   Sexual activity: Not on file  Other Topics Concern   Not on file  Social History Narrative   Not on file   Social Determinants of Health   Financial Resource Strain: Not on file  Food Insecurity: Not on file  Transportation Needs: Not on file  Physical Activity: Not on  file  Stress: Not on file  Social Connections: Not on file  Intimate Partner Violence: Not on file    Review of Systems: See HPI, otherwise negative ROS  Physical Exam: BP 124/70   Pulse (!) 51   Temp 97.7 F (36.5 C) (Temporal)   Ht 6' (1.829 m)   Wt 87.5 kg   SpO2 97%   BMI 26.18 kg/m  General:   Alert,  pleasant and cooperative in NAD Head:  Normocephalic and atraumatic. Neck:  Supple; no masses or thyromegaly. Lungs:  Clear throughout to auscultation.    Heart:  Regular rate and rhythm. Abdomen:  Soft, nontender and nondistended. Normal bowel sounds, without guarding, and without rebound.   Neurologic:  Alert and  oriented x4;  grossly normal neurologically.  Impression/Plan: Jeffrey Stokes is here for an colonoscopy to be performed for a history of adenomatous polyps on 2016   Risks, benefits, limitations, and alternatives regarding  colonoscopy have been reviewed with the patient.  Questions have been answered.  All parties agreeable.   Jeffrey Lame, MD  02/19/2021, 9:43 AM

## 2021-02-22 ENCOUNTER — Encounter: Payer: Self-pay | Admitting: Gastroenterology

## 2021-02-23 LAB — SURGICAL PATHOLOGY

## 2021-02-24 ENCOUNTER — Encounter: Payer: Self-pay | Admitting: Gastroenterology

## 2021-02-25 NOTE — Telephone Encounter (Signed)
complete

## 2021-03-01 ENCOUNTER — Other Ambulatory Visit: Payer: Self-pay

## 2021-03-01 ENCOUNTER — Ambulatory Visit (INDEPENDENT_AMBULATORY_CARE_PROVIDER_SITE_OTHER): Payer: Medicare HMO | Admitting: *Deleted

## 2021-03-01 DIAGNOSIS — Z0000 Encounter for general adult medical examination without abnormal findings: Secondary | ICD-10-CM | POA: Diagnosis not present

## 2021-03-01 NOTE — Progress Notes (Signed)
Subjective:   Jeffrey Stokes is a 60 y.o. male who presents for an Initial Medicare Annual Wellness Visit.  I connected with  Jeffrey Stokes on 03/01/21 by a Audio enabled telemedicine application and verified that I am speaking with the correct person using two identifiers.   I discussed the limitations of evaluation and management by telemedicine. The patient expressed understanding and agreed to proceed.    Patient Location: Home  Staff Location: Office People Participating in Visit: Jeffrey Stokes and his Wife Jeffrey Stokes; [name], RMA   Objective:    Today's Vitals   03/01/21 0919  PainSc: 0-No pain   There is no height or weight on file to calculate BMI.  Advanced Directives 03/01/2021 02/19/2021 02/03/2021 07/02/2016 07/02/2016 08/25/2015 08/20/2015  Does Patient Have a Medical Advance Directive? No No No No No No No  Would patient like information on creating a medical advance directive? No - Patient declined No - Patient declined - - No - Patient declined - No - patient declined information    Current Medications (verified) Outpatient Encounter Medications as of 03/01/2021  Medication Sig   Ascorbic Acid (VITAMIN C PO) Take 1 tablet by mouth daily. Reported on 09/23/2015   aspirin EC 325 MG EC tablet Take 1 tablet (325 mg total) by mouth daily. (Patient taking differently: Take 81 mg by mouth daily.)   cetirizine (ZYRTEC) 10 MG tablet Take 10 mg by mouth at bedtime.    Cholecalciferol (VITAMIN D PO) Take 1 tablet by mouth daily. Reported on 09/23/2015   Cyanocobalamin (B-12 PO) Take 1 tablet by mouth daily. Reported on 09/23/2015   Multiple Vitamins-Minerals (MULTIVITAMIN ADULT PO) Take 1 tablet by mouth daily. Reported on 09/23/2015   Multiple Vitamins-Minerals (ZINC PO) Take 1 tablet by mouth.   omeprazole (PRILOSEC) 20 MG capsule Take 20 mg by mouth daily.   valACYclovir (VALTREX) 1000 MG tablet Take 1 tablet (1,000 mg total) by mouth daily.   famotidine (PEPCID) 20 MG tablet Take 20  mg by mouth daily. (Patient not taking: Reported on 03/01/2021)   No facility-administered encounter medications on file as of 03/01/2021.    Allergies (verified) Hydrocodone and Lyrica [pregabalin]   History: Past Medical History:  Diagnosis Date   Allergy    Anxiety    Benign neoplasm of ascending colon    Benign neoplasm of descending colon    Benign neoplasm of transverse colon    Depression    GERD (gastroesophageal reflux disease)    History of motion sickness    laying on back   HOH (hard of hearing)    bilateral   Hx-TIA (transient ischemic attack) 2012   Sleep apnea    had one sleep study, inconclusive,needs repeat   Stroke ([place])    3 strokes last one on 2012/ mouth tilt/ weakness right hand side   Wears dentures    full upper and lower   Past Surgical History:  Procedure Laterality Date   COLONOSCOPY WITH PROPOFOL N/A 06/01/2015   Procedure: COLONOSCOPY WITH PROPOFOL;  Surgeon: [name], MD;  Location: [facility];  Service: Endoscopy;  Laterality: N/A;  ASCENDING COLON POLYP DESCENDING COLON POLYP TRANSVERSE COLON POLYP   COLONOSCOPY WITH PROPOFOL N/A 02/19/2021   Procedure: COLONOSCOPY WITH PROPOFOL;  Surgeon: [name], MD;  Location: [facility];  Service: Endoscopy;  Laterality: N/A;   EYE SURGERY     Age 15    POLYPECTOMY  02/19/2021   Procedure: POLYPECTOMY;  Surgeon: [name],  MD;  Location: [facility];  Service: Endoscopy;;   SALIVARY GLAND SURGERY     SHOULDER ARTHROSCOPY Left 03/2014   bicep and nerve problems still   TONSILLECTOMY     UMBILICAL HERNIA REPAIR N/A 08/25/2015   Procedure: HERNIA REPAIR UMBILICAL ADULT;  Surgeon: Jules Husbands, MD;  Location: [facility];  Service: General;  Laterality: N/A;   Family History  Problem Relation Age of Onset   Osteoarthritis Father    Stroke Father    Social History   Socioeconomic History   Marital status: Married    Spouse name: Jeffrey Stokes   Number of children: 1   Years  of education: 9   Highest education level: GED or equivalent  Occupational History   Occupation: Works for [employer]    Comment: Disability  Tobacco Use   Smoking status: Former    Packs/day: 2.00    Years: 33.00    Pack years: 66.00    Types: Cigarettes    Start date: 06/28/1971    Quit date: 12/25/2005    Years since quitting: 15.1   Smokeless tobacco: Former    Types: Nurse, children's Use: Never used  Substance and Sexual Activity   Alcohol use: No    Alcohol/week: 0.0 standard drinks   Drug use: Never   Sexual activity: Yes    Birth control/protection: None  Other Topics Concern   Not on file  Social History Narrative   Not on file   Social Determinants of Health   Financial Resource Strain: Low Risk    Difficulty of Paying Living Expenses: Not hard at all  Food Insecurity: No Food Insecurity   Worried About Charity fundraiser in the Last Year: Never true   [place] in the Last Year: Never true  Transportation Needs: No Transportation Needs   Lack of Transportation (Medical): No   Lack of Transportation (Non-Medical): No  Physical Activity: Sufficiently Active   Days of Exercise per Week: 5 days   Minutes of Exercise per Session: 40 min  Stress: No Stress Concern Present   Feeling of Stress : Not at all  Social Connections: Unknown   Frequency of Communication with Friends and Family: Not on file   Frequency of Social Gatherings with Friends and Family: Not on file   Attends Religious Services: Not on Electrical engineer or Organizations: Not on file   Attends Archivist Meetings: Not on file   Marital Status: Married    Tobacco Counseling Counseling given: Yes   Clinical Intake:   Pain : No/denies pain Pain Score: 0-No pain     Nutritional Risks: None Diabetes: No  How often do you need to have someone help you when you read instructions, pamphlets, or other written materials from your doctor or  pharmacy?: 1 - Never What is the last grade level you completed in school?: GED  Diabetic?No  Interpreter Needed?: No   Activities of Daily Living In your present state of health, do you have Jeffrey difficulty performing the following activities: 03/01/2021 02/19/2021  Hearing? Y N  Comment Patient needs hearing Aids -  Vision? N N  Difficulty concentrating or making decisions? N N  Walking or climbing stairs? N N  Dressing or bathing? N N  Doing errands, shopping? N -  Preparing Food and eating ? N -  Using the Toilet? N -  In the past six months, have you  accidently leaked urine? N -  Do you have problems with loss of bowel control? N -  Managing your Medications? N -  Managing your Finances? N -  Housekeeping or managing your Housekeeping? N -  Some recent data might be hidden    Patient Care Team: Glean Hess, MD as PCP - General (Internal Medicine) [name], MD as Consulting Physician (Gastroenterology)  Indicate Jeffrey recent Medical Services you may have received from other than [facility] providers in the past year (date may be approximate).     Assessment:   This is a routine wellness examination for Bernie.  Hearing/Vision screen No results found.  Dietary issues and exercise activities discussed: Current Exercise Habits: Home exercise routine, Type of exercise: walking, Time (Minutes): 30, Frequency (Times/Week): 5, Weekly Exercise (Minutes/Week): 150, Intensity: Moderate, Exercise limited by: None identified   Goals Addressed   None   Depression Screen PHQ 2/9 Scores 03/01/2021 12/18/2020 06/18/2020 12/11/2019 05/29/2019 09/07/2018 03/14/2017  PHQ - 2 Score 0 0 0 0 0 0 3  PHQ- 9 Score 0 0 0 0 0 - 9    Fall Risk Fall Risk  03/01/2021 12/18/2020 06/18/2020 12/11/2019 09/07/2018  Falls in the past year? 0 0 1 0 0  Number falls in past yr: 0 0 0 0 0  Injury with Fall? 0 0 0 0 0  Risk for fall due to : No Fall Risks No Fall Risks - No Fall Risks -  Follow up Falls  evaluation completed Falls evaluation completed Falls evaluation completed Falls evaluation completed Falls evaluation completed    FALL RISK PREVENTION PERTAINING TO THE HOME:  Jeffrey stairs in or around the home? No  If so, are there Jeffrey without handrails? No  Home free of loose throw rugs in walkways, pet beds, electrical cords, etc? Yes  Adequate lighting in your home to reduce risk of falls? Yes   ASSISTIVE DEVICES UTILIZED TO PREVENT FALLS:  Life alert? No  Use of a cane, walker or w/c? No  Grab bars in the bathroom? No  Patient states they will be remodeling restroom and they will put bar/rail in shower. Shower chair or bench in shower? No  Elevated toilet seat or a handicapped toilet? No   Cognitive Function:     6CIT Screen 03/01/2021  What Year? 0 points  What month? 0 points  What time? 0 points  Count back from 20 0 points  Months in reverse 0 points  Repeat phrase 0 points  Total Score 0    Immunizations Immunization History  Administered Date(s) Administered   Influenza,inj,Quad PF,6+ Mos 03/14/2017, 05/04/2018   Influenza-Unspecified 03/27/2015    TDAP status: Due, Education has been provided regarding the importance of this vaccine. Advised may receive this vaccine at local pharmacy or Health Dept. Aware to provide a copy of the vaccination record if obtained from local pharmacy or Health Dept. Verbalized acceptance and understanding.  Flu Vaccine status: Due, Education has been provided regarding the importance of this vaccine. Advised may receive this vaccine at local pharmacy or Health Dept. Aware to provide a copy of the vaccination record if obtained from local pharmacy or Health Dept. Verbalized acceptance and understanding.  Pneumococcal vaccine status: Due, Education has been provided regarding the importance of this vaccine. Advised may receive this vaccine at local pharmacy or Health Dept. Aware to provide a copy of the vaccination record if obtained  from local pharmacy or Health Dept. Verbalized acceptance and understanding.  Covid-19 vaccine status:  Declined, Education has been provided regarding the importance of this vaccine but patient still declined. Advised may receive this vaccine at local pharmacy or Health Dept.or vaccine clinic. Aware to provide a copy of the vaccination record if obtained from local pharmacy or Health Dept. Verbalized acceptance and understanding. Patient stated he is not interested in getting this vaccine at this time.  Qualifies for Shingles Vaccine? Yes   Zostavax completed No   Shingrix Completed?: No.    Education has been provided regarding the importance of this vaccine. Patient has been advised to call insurance company to determine out of pocket expense if they have not yet received this vaccine. Advised may also receive vaccine at local pharmacy or Health Dept. Verbalized acceptance and understanding.  Screening Tests Health Maintenance  Topic Date Due   TETANUS/TDAP  Never done   Zoster Vaccines- Shingrix (1 of 2) Never done   INFLUENZA VACCINE  01/25/2021   COLONOSCOPY (Pts 45-40yr Insurance coverage will need to be confirmed)  02/20/2028   Hepatitis C Screening  Addressed   HIV Screening  Addressed   Pneumococcal Vaccine [id number] old  Aged Out   HPV VACCINES  Aged Out   COVID-19 Vaccine  Discontinued    Health Maintenance  Health Maintenance Due  Topic Date Due   TETANUS/TDAP  Never done   Zoster Vaccines- Shingrix (1 of 2) Never done   INFLUENZA VACCINE  01/25/2021    Colorectal cancer screening: Type of screening: Colonoscopy. Completed 02/19/2021. Repeat every 7 years  Lung Cancer Screening: (Low Dose CT Chest recommended if Age 58-80years, 30 pack-year currently smoking OR have quit w/in 15years.) does qualify.    Additional Screening:  Hepatitis C Screening: does qualify; Completed: Not completed. Will call office to schedule an appointment.  Vision Screening: Recommended  annual ophthalmology exams for early detection of glaucoma and other disorders of the eye. Is the patient up to date with their annual eye exam?  No  Who is the provider or what is the name of the office in which the patient attends annual eye exams? [place] is not established with a provider, would they like to be referred to a provider to establish care? No .   Dental Screening: Recommended annual dental exams for proper oral hygiene  Community Resource Referral / Chronic Care Management: CRR required this visit?  No   CCM required this visit?  No      Plan:     I have personally reviewed and noted the following in the patient's chart:   Medical and social history Use of alcohol, tobacco or illicit drugs  Current medications and supplements including opioid prescriptions. Patient is not currently taking opioid prescriptions. Functional ability and status Nutritional status Physical activity Advanced directives List of other physicians Hospitalizations, surgeries, and ER visits in previous 12 months Vitals Screenings to include cognitive, depression, and falls Referrals and appointments  In addition, I have reviewed and discussed with patient certain preventive protocols, quality metrics, and best practice recommendations. A written personalized care plan for preventive services as well as general preventive health recommendations were provided to patient.     REmilio AspenAJamestown RUtah  [id number]  Nurse Notes: None Face to Face, 45 minutes  Patient verbalized understanding and alertness dure entire Visit. Patient wife BHassan Rowanaccompanied him during entire visit. This is patient Initial Medicare Annual visit due to just getting Medicare. Patient was informed to call office to get scheduled for in-person  to get updated on his vaccines and get his screening that was discussed during visit. Patient and wife verbalized understanding and agreed.

## 2021-06-03 DIAGNOSIS — Z1283 Encounter for screening for malignant neoplasm of skin: Secondary | ICD-10-CM | POA: Diagnosis not present

## 2021-06-03 DIAGNOSIS — L820 Inflamed seborrheic keratosis: Secondary | ICD-10-CM | POA: Diagnosis not present

## 2021-06-03 DIAGNOSIS — X32XXXA Exposure to sunlight, initial encounter: Secondary | ICD-10-CM | POA: Diagnosis not present

## 2021-06-03 DIAGNOSIS — D485 Neoplasm of uncertain behavior of skin: Secondary | ICD-10-CM | POA: Diagnosis not present

## 2021-06-03 DIAGNOSIS — L570 Actinic keratosis: Secondary | ICD-10-CM | POA: Diagnosis not present

## 2021-06-03 DIAGNOSIS — D225 Melanocytic nevi of trunk: Secondary | ICD-10-CM | POA: Diagnosis not present

## 2021-06-22 ENCOUNTER — Encounter: Payer: Self-pay | Admitting: Internal Medicine

## 2021-06-30 ENCOUNTER — Ambulatory Visit (INDEPENDENT_AMBULATORY_CARE_PROVIDER_SITE_OTHER): Payer: Medicare HMO | Admitting: Internal Medicine

## 2021-06-30 ENCOUNTER — Encounter: Payer: Self-pay | Admitting: Internal Medicine

## 2021-06-30 ENCOUNTER — Other Ambulatory Visit: Payer: Self-pay

## 2021-06-30 VITALS — BP 110/70 | HR 66 | Ht 73.0 in | Wt 208.0 lb

## 2021-06-30 DIAGNOSIS — Z125 Encounter for screening for malignant neoplasm of prostate: Secondary | ICD-10-CM | POA: Diagnosis not present

## 2021-06-30 DIAGNOSIS — I779 Disorder of arteries and arterioles, unspecified: Secondary | ICD-10-CM | POA: Diagnosis not present

## 2021-06-30 DIAGNOSIS — N401 Enlarged prostate with lower urinary tract symptoms: Secondary | ICD-10-CM | POA: Diagnosis not present

## 2021-06-30 DIAGNOSIS — K219 Gastro-esophageal reflux disease without esophagitis: Secondary | ICD-10-CM | POA: Diagnosis not present

## 2021-06-30 DIAGNOSIS — Z0000 Encounter for general adult medical examination without abnormal findings: Secondary | ICD-10-CM | POA: Diagnosis not present

## 2021-06-30 DIAGNOSIS — E782 Mixed hyperlipidemia: Secondary | ICD-10-CM

## 2021-06-30 DIAGNOSIS — Z23 Encounter for immunization: Secondary | ICD-10-CM

## 2021-06-30 DIAGNOSIS — N138 Other obstructive and reflux uropathy: Secondary | ICD-10-CM | POA: Insufficient documentation

## 2021-06-30 DIAGNOSIS — I6523 Occlusion and stenosis of bilateral carotid arteries: Secondary | ICD-10-CM

## 2021-06-30 MED ORDER — TAMSULOSIN HCL 0.4 MG PO CAPS: 0.4000 mg | ORAL_CAPSULE | Freq: Every day | ORAL | 3 refills | Status: DC

## 2021-06-30 NOTE — Progress Notes (Signed)
Date:  06/30/2021   Name:  Jeffrey Stokes   DOB:  04-09-1961   MRN:  [id number]   Chief Complaint: Annual Exam Jeffrey Stokes is a 61 y.o. male who presents today for his Complete Annual Exam. He feels well. He reports exercising - none. He reports he is sleeping well. Right shoulder is worsening - very painful with movement and ROM markedly reduced.  Colonoscopy: 01/2021  Immunization History  Administered Date(s) Administered   Influenza,inj,Quad PF,6+ Mos 03/14/2017, 05/04/2018, 06/30/2021   Influenza-Unspecified 03/27/2015    Lab Results  Component Value Date   PSA1 0.5 06/18/2020   PSA1 0.5 05/29/2019   PSA1 0.4 09/07/2018    Gastroesophageal Reflux He complains of heartburn. He reports no abdominal pain, no choking or no wheezing. This is a recurrent problem. The problem occurs occasionally. Pertinent negatives include no fatigue. He has tried a PPI and a histamine-2 antagonist for the symptoms. The treatment provided significant relief.  Shoulder Pain  The pain is present in the right shoulder. This is a chronic problem. The problem has been gradually worsening. The pain is severe. Associated symptoms include a limited range of motion.   Lab Results  Component Value Date   NA 141 06/18/2020   K 4.5 06/18/2020   CO2 22 06/18/2020   GLUCOSE 106 (H) 06/18/2020   BUN 10 06/18/2020   CREATININE 1.06 06/18/2020   CALCIUM 9.4 06/18/2020   GFRNONAA 78 06/18/2020   Lab Results  Component Value Date   CHOL 219 (H) 06/18/2020   HDL 43 06/18/2020   LDLCALC 154 (H) 06/18/2020   TRIG 121 06/18/2020   CHOLHDL 5.1 (H) 06/18/2020   Lab Results  Component Value Date   TSH 1.440 10/26/2015   No results found for: HGBA1C Lab Results  Component Value Date   WBC 7.4 06/18/2020   HGB 16.5 06/18/2020   HCT 46.3 06/18/2020   MCV 100 (H) 06/18/2020   PLT 248 06/18/2020   Lab Results  Component Value Date   ALT 51 (H) 06/18/2020   AST 35 06/18/2020   ALKPHOS 83 06/18/2020    BILITOT 0.5 06/18/2020   No results found for: 25OHVITD2, 25OHVITD3, VD25OH   Review of Systems  Constitutional:  Negative for appetite change, chills, diaphoresis, fatigue and unexpected weight change.  HENT:  Negative for hearing loss, tinnitus, trouble swallowing and voice change.   Eyes:  Negative for visual disturbance.  Respiratory:  Negative for choking, shortness of breath and wheezing.   Cardiovascular:  Negative for palpitations and leg swelling.  Gastrointestinal:  Positive for heartburn. Negative for abdominal pain, blood in stool, constipation and diarrhea.  Genitourinary:  Positive for frequency. Negative for difficulty urinating, dysuria, scrotal swelling, testicular pain and urgency.  Musculoskeletal:  Positive for arthralgias (shoulders). Negative for back pain and myalgias.  Skin:  Negative for color change and rash.  Neurological:  Negative for dizziness, syncope and headaches.  Hematological:  Negative for adenopathy.  Psychiatric/Behavioral:  Negative for dysphoric mood and sleep disturbance. The patient is not nervous/anxious.    Patient Active Problem List   Diagnosis Date Noted   Mixed hyperlipidemia 06/30/2021   BPH with obstruction/lower urinary tract symptoms 06/30/2021   Bilateral carotid artery disease, unspecified type ([place]) 06/30/2021   History of colonic polyps    Polyp of transverse colon    Gastroesophageal reflux disease 03/14/2017   Ulnar neuritis, left 04/20/2016   Complex regional pain syndrome type 2 of left upper extremity 02/12/2016  Rhinitis, allergic 07/29/2015   LPRD (laryngopharyngeal reflux disease) 03/31/2015   Panic disorder without agoraphobia 03/31/2015   History of stroke 12/03/2014   HSV-2 infection 12/03/2014   Obstructive apnea 12/03/2014   Disorder of rotator cuff 12/03/2014   Mild atherosclerosis of carotid artery, bilateral     Allergies  Allergen Reactions   Hydrocodone Nausea Only and Anxiety   Lyrica [Pregabalin]  Other (See Comments)    "drunk feeling"    Past Surgical History:  Procedure Laterality Date   COLONOSCOPY WITH PROPOFOL N/A 06/01/2015   Procedure: COLONOSCOPY WITH PROPOFOL;  Surgeon: [name], MD;  Location: [facility];  Service: Endoscopy;  Laterality: N/A;  ASCENDING COLON POLYP DESCENDING COLON POLYP TRANSVERSE COLON POLYP   COLONOSCOPY WITH PROPOFOL N/A 02/19/2021   Procedure: COLONOSCOPY WITH PROPOFOL;  Surgeon: [name], MD;  Location: [facility];  Service: Endoscopy;  Laterality: N/A;   EYE SURGERY     Age 19    POLYPECTOMY  02/19/2021   Procedure: POLYPECTOMY;  Surgeon: [name], MD;  Location: [facility];  Service: Endoscopy;;   SALIVARY GLAND SURGERY     SHOULDER ARTHROSCOPY Left 03/2014   bicep and nerve problems still   TONSILLECTOMY     UMBILICAL HERNIA REPAIR N/A 08/25/2015   Procedure: HERNIA REPAIR UMBILICAL ADULT;  Surgeon: Jules Husbands, MD;  Location: [facility];  Service: General;  Laterality: N/A;    Social History   Tobacco Use   Smoking status: Former    Packs/day: 2.00    Years: 33.00    Pack years: 66.00    Types: Cigarettes    Start date: 06/28/1971    Quit date: 12/25/2005    Years since quitting: 15.5   Smokeless tobacco: Former    Types: Nurse, children's Use: Never used  Substance Use Topics   Alcohol use: No    Alcohol/week: 0.0 standard drinks   Drug use: Never     Medication list has been reviewed and updated.  Current Meds  Medication Sig   Ascorbic Acid (VITAMIN C PO) Take 1 tablet by mouth daily. Reported on 09/23/2015   aspirin EC 325 MG EC tablet Take 1 tablet (325 mg total) by mouth daily. (Patient taking differently: Take 81 mg by mouth daily.)   cetirizine (ZYRTEC) 10 MG tablet Take 10 mg by mouth at bedtime.    Cholecalciferol (VITAMIN D PO) Take 1 tablet by mouth daily. Reported on 09/23/2015   Cyanocobalamin (B-12 PO) Take 1 tablet by mouth daily. Reported on 09/23/2015   famotidine  (PEPCID) 20 MG tablet Take 20 mg by mouth daily.   Multiple Vitamins-Minerals (MULTIVITAMIN ADULT PO) Take 1 tablet by mouth daily. Reported on 09/23/2015   Multiple Vitamins-Minerals (ZINC PO) Take 1 tablet by mouth.   omeprazole (PRILOSEC) 20 MG capsule Take 20 mg by mouth daily.   tamsulosin (FLOMAX) 0.4 MG CAPS capsule Take 1 capsule (0.4 mg total) by mouth at bedtime.   valACYclovir (VALTREX) 1000 MG tablet Take 1 tablet (1,000 mg total) by mouth daily.    PHQ 2/9 Scores 06/30/2021 03/01/2021 12/18/2020 06/18/2020  PHQ - 2 Score 0 0 0 0  PHQ- 9 Score 4 0 0 0    GAD 7 : Generalized Anxiety Score 06/30/2021 03/01/2021 12/18/2020 06/18/2020  Nervous, Anxious, on Edge 3 0 0 0  Control/stop worrying 1 0 0 0  Worry too much - different things 0 0 0 0  Trouble relaxing 0 0 0  0  Restless 0 0 0 0  Easily annoyed or irritable 0 0 0 0  Afraid - awful might happen 0 0 0 0  Total GAD 7 Score 4 0 0 0  Anxiety Difficulty Not difficult at all Not difficult at all Not difficult at all -    BP Readings from Last 3 Encounters:  06/30/21 110/70  02/19/21 120/69  02/03/21 131/80    Physical Exam Vitals and nursing note reviewed.  Constitutional:      Appearance: Normal appearance. He is well-developed.  HENT:     Head: Normocephalic.     Right Ear: Tympanic membrane, ear canal and external ear normal.     Left Ear: Tympanic membrane, ear canal and external ear normal.     Nose: Nose normal.  Eyes:     Conjunctiva/sclera: Conjunctivae normal.     Pupils: Pupils are equal, round, and reactive to light.  Neck:     Thyroid: No thyromegaly.     Vascular: No carotid bruit.  Cardiovascular:     Rate and Rhythm: Normal rate and regular rhythm.     Heart sounds: Normal heart sounds.  Pulmonary:     Effort: Pulmonary effort is normal.     Breath sounds: Normal breath sounds. No wheezing.  Chest:  Breasts:    Right: No mass.     Left: No mass.  Abdominal:     General: Bowel sounds are normal.      Palpations: Abdomen is soft.     Tenderness: There is no abdominal tenderness.  Genitourinary:    Prostate: Enlarged. Not tender.  Musculoskeletal:     Right shoulder: Tenderness present. Decreased range of motion. Decreased strength.     Cervical back: Normal range of motion and neck supple.  Lymphadenopathy:     Cervical: No cervical adenopathy.  Skin:    General: Skin is warm and dry.  Neurological:     Mental Status: He is alert and oriented to person, place, and time.     Deep Tendon Reflexes: Reflexes are normal and symmetric.  Psychiatric:        Attention and Perception: Attention normal.        Mood and Affect: Mood normal.        Thought Content: Thought content normal.    Wt Readings from Last 3 Encounters:  06/30/21 208 lb (94.3 kg)  02/19/21 193 lb (87.5 kg)  02/03/21 197 lb (89.4 kg)    BP 110/70    Pulse 66    Ht 6\' 1"  (1.854 m)    Wt 208 lb (94.3 kg)    SpO2 99%    BMI 27.44 kg/m   Assessment and Plan: 1. Annual physical exam Up to date on screenings and immunizations. Flu vaccine today. - Comprehensive metabolic panel  2. Gastroesophageal reflux disease without esophagitis Symptoms well controlled on daily PPI No red flag signs such as weight loss, n/v, melena Will continue omeprazole. - CBC with Differential/Platelet  3. Mild atherosclerosis of carotid artery, bilateral Check lipids and advise. - Lipid panel  4. Prostate cancer screening - PSA  5. Mixed hyperlipidemia - Lipid panel  6. BPH with obstruction/lower urinary tract symptoms Trial of Flomax at HS - tamsulosin (FLOMAX) 0.4 MG CAPS capsule; Take 1 capsule (0.4 mg total) by mouth at bedtime.  Dispense: 30 capsule; Refill: 3  7. Disorder of right rotator cuff Severely limited by his symptoms. I strongly advise Ortho consultation - he was told 3 yrs ago that  nothing could be done for him so he has not returned.   Partially dictated using Editor, commissioning. Any errors are  unintentional.  Halina Maidens, MD [facility]  06/30/2021

## 2021-07-01 LAB — CBC WITH DIFFERENTIAL/PLATELET
Basophils Absolute: 0 10*3/uL (ref 0.0–0.2)
Basos: 1 %
EOS (ABSOLUTE): 0.1 10*3/uL (ref 0.0–0.4)
Eos: 1 %
Hematocrit: 47.3 % (ref 37.5–51.0)
Hemoglobin: 16.7 g/dL (ref 13.0–17.7)
Immature Grans (Abs): 0 10*3/uL (ref 0.0–0.1)
Immature Granulocytes: 1 %
Lymphocytes Absolute: 2.5 10*3/uL (ref 0.7–3.1)
Lymphs: 34 %
MCH: 33.5 pg — ABNORMAL HIGH (ref 26.6–33.0)
MCHC: 35.3 g/dL (ref 31.5–35.7)
MCV: 95 fL (ref 79–97)
Monocytes Absolute: 1 10*3/uL — ABNORMAL HIGH (ref 0.1–0.9)
Monocytes: 13 %
Neutrophils Absolute: 3.7 10*3/uL (ref 1.4–7.0)
Neutrophils: 50 %
Platelets: 259 10*3/uL (ref 150–450)
RBC: 4.98 x10E6/uL (ref 4.14–5.80)
RDW: 12.7 % (ref 11.6–15.4)
WBC: 7.3 10*3/uL (ref 3.4–10.8)

## 2021-07-01 LAB — COMPREHENSIVE METABOLIC PANEL
ALT: 38 IU/L (ref 0–44)
AST: 31 IU/L (ref 0–40)
Albumin/Globulin Ratio: 2.2 (ref 1.2–2.2)
Albumin: 4.9 g/dL (ref 3.8–4.9)
Alkaline Phosphatase: 85 IU/L (ref 44–121)
BUN/Creatinine Ratio: 11 (ref 9–20)
BUN: 12 mg/dL (ref 6–24)
Bilirubin Total: 0.5 mg/dL (ref 0.0–1.2)
CO2: 24 mmol/L (ref 20–29)
Calcium: 9.3 mg/dL (ref 8.7–10.2)
Chloride: 101 mmol/L (ref 96–106)
Creatinine, Ser: 1.05 mg/dL (ref 0.76–1.27)
Globulin, Total: 2.2 g/dL (ref 1.5–4.5)
Glucose: 111 mg/dL — ABNORMAL HIGH (ref 70–99)
Potassium: 4.4 mmol/L (ref 3.5–5.2)
Sodium: 139 mmol/L (ref 134–144)
Total Protein: 7.1 g/dL (ref 6.0–8.5)
eGFR: 82 mL/min/{1.73_m2} (ref >59)

## 2021-07-01 LAB — LIPID PANEL
Chol/HDL Ratio: 4.3 ratio (ref 0.0–5.0)
Cholesterol, Total: 202 mg/dL — ABNORMAL HIGH (ref 100–199)
HDL: 47 mg/dL (ref >39)
LDL Chol Calc (NIH): 134 mg/dL — ABNORMAL HIGH (ref 0–99)
Triglycerides: 114 mg/dL (ref 0–149)
VLDL Cholesterol Cal: 21 mg/dL (ref 5–40)

## 2021-07-01 LAB — PSA: Prostate Specific Ag, Serum: 0.7 ng/mL (ref 0.0–4.0)

## 2021-07-08 DIAGNOSIS — L820 Inflamed seborrheic keratosis: Secondary | ICD-10-CM | POA: Diagnosis not present

## 2021-07-08 DIAGNOSIS — D485 Neoplasm of uncertain behavior of skin: Secondary | ICD-10-CM | POA: Diagnosis not present

## 2021-07-08 DIAGNOSIS — L988 Other specified disorders of the skin and subcutaneous tissue: Secondary | ICD-10-CM | POA: Diagnosis not present

## 2021-08-27 ENCOUNTER — Other Ambulatory Visit: Payer: Self-pay

## 2021-08-27 ENCOUNTER — Encounter: Payer: Self-pay | Admitting: Internal Medicine

## 2021-08-27 ENCOUNTER — Telehealth (INDEPENDENT_AMBULATORY_CARE_PROVIDER_SITE_OTHER): Payer: Medicare HMO | Admitting: Internal Medicine

## 2021-08-27 VITALS — Ht 73.0 in | Wt 208.0 lb

## 2021-08-27 DIAGNOSIS — J0100 Acute maxillary sinusitis, unspecified: Secondary | ICD-10-CM | POA: Diagnosis not present

## 2021-08-27 MED ORDER — ALBUTEROL SULFATE HFA 108 (90 BASE) MCG/ACT IN AERS: 2.0000 | INHALATION_SPRAY | Freq: Four times a day (QID) | RESPIRATORY_TRACT | 0 refills | Status: DC | PRN

## 2021-08-27 MED ORDER — PROMETHAZINE-DM 6.25-15 MG/5ML PO SYRP: 5.0000 mL | ORAL_SOLUTION | Freq: Four times a day (QID) | ORAL | 0 refills | Status: DC | PRN

## 2021-08-27 MED ORDER — AMOXICILLIN-POT CLAVULANATE 875-125 MG PO TABS: 1.0000 | ORAL_TABLET | Freq: Two times a day (BID) | ORAL | 0 refills | Status: AC

## 2021-08-27 NOTE — Progress Notes (Signed)
? ? ?Date:  08/27/2021  ? ?Name:  Jeffrey Stokes   DOB:  1960/11/25   MRN:  [id number] ? ? ?Chief Complaint: Cough ? ? ?Virtual Visit via Video Note ? ?I connected with Jeffrey Stokes on 08/27/21 at  4:20 PM EST by a video enabled telemedicine application and verified that I am speaking with the correct person using two identifiers. ? ?Location: ?Patient: HOME ?Provider: OFFICE ?  ?I discussed the limitations of evaluation and management by telemedicine and the availability of in person appointments. The patient expressed understanding and agreed to proceed. ? ?Cough ?This is a new problem. The current episode started in the past 7 days. The problem has been unchanged. The cough is Productive of sputum. Associated symptoms include headaches, myalgias, nasal congestion, postnasal drip, a sore throat, shortness of breath and wheezing. Pertinent negatives include no chest pain, chills or fever. He has tried OTC cough suppressant for the symptoms. The treatment provided mild relief.  ? ?Lab Results  ?Component Value Date  ? NA 139 06/30/2021  ? K 4.4 06/30/2021  ? CO2 24 06/30/2021  ? GLUCOSE 111 (H) 06/30/2021  ? BUN 12 06/30/2021  ? CREATININE 1.05 06/30/2021  ? CALCIUM 9.3 06/30/2021  ? EGFR 82 06/30/2021  ? GFRNONAA 78 06/18/2020  ? ?Lab Results  ?Component Value Date  ? CHOL 202 (H) 06/30/2021  ? HDL 47 06/30/2021  ? LDLCALC 134 (H) 06/30/2021  ? TRIG 114 06/30/2021  ? CHOLHDL 4.3 06/30/2021  ? ?Lab Results  ?Component Value Date  ? TSH 1.440 10/26/2015  ? ?No results found for: HGBA1C ?Lab Results  ?Component Value Date  ? WBC 7.3 06/30/2021  ? HGB 16.7 06/30/2021  ? HCT 47.3 06/30/2021  ? MCV 95 06/30/2021  ? PLT 259 06/30/2021  ? ?Lab Results  ?Component Value Date  ? ALT 38 06/30/2021  ? AST 31 06/30/2021  ? ALKPHOS 85 06/30/2021  ? BILITOT 0.5 06/30/2021  ? ?No results found for: 25OHVITD2, [place], VD25OH  ? ?Review of Systems  ?Constitutional:  Positive for diaphoresis (sweats last night but no fever). Negative  for chills, fatigue and fever.  ?HENT:  Positive for congestion, postnasal drip, sneezing, sore throat and voice change.   ?Respiratory:  Positive for cough, shortness of breath and wheezing.   ?Cardiovascular:  Negative for chest pain and palpitations.  ?Gastrointestinal:  Negative for abdominal pain, diarrhea and vomiting.  ?Musculoskeletal:  Positive for myalgias.  ?Neurological:  Positive for headaches.  ?Psychiatric/Behavioral:  Positive for sleep disturbance.   ? ?Patient Active Problem List  ? Diagnosis Date Noted  ? Mixed hyperlipidemia 06/30/2021  ? BPH with obstruction/lower urinary tract symptoms 06/30/2021  ? Bilateral carotid artery disease, unspecified type ([place]) 06/30/2021  ? History of colonic polyps   ? Polyp of transverse colon   ? Gastroesophageal reflux disease 03/14/2017  ? Ulnar neuritis, left 04/20/2016  ? Complex regional pain syndrome type 2 of left upper extremity 02/12/2016  ? Rhinitis, allergic 07/29/2015  ? LPRD (laryngopharyngeal reflux disease) 03/31/2015  ? Panic disorder without agoraphobia 03/31/2015  ? History of stroke 12/03/2014  ? HSV-2 infection 12/03/2014  ? Obstructive apnea 12/03/2014  ? Disorder of rotator cuff 12/03/2014  ? Mild atherosclerosis of carotid artery, bilateral   ? ? ?Allergies  ?Allergen Reactions  ? Hydrocodone Nausea Only and Anxiety  ? Lyrica [Pregabalin] Other (See Comments)  ?  "drunk feeling"  ? ? ?Past Surgical History:  ?Procedure Laterality Date  ? COLONOSCOPY WITH PROPOFOL  N/A 06/01/2015  ? Procedure: COLONOSCOPY WITH PROPOFOL;  Surgeon: [name], MD;  Location: [place];  Service: Endoscopy;  Laterality: N/A;  ASCENDING COLON POLYP ?DESCENDING COLON POLYP ?TRANSVERSE COLON POLYP  ? COLONOSCOPY WITH PROPOFOL N/A 02/19/2021  ? Procedure: COLONOSCOPY WITH PROPOFOL;  Surgeon: [name], MD;  Location: [place];  Service: Endoscopy;  Laterality: N/A;  ? EYE SURGERY    ? Age 61   ? POLYPECTOMY  02/19/2021  ? Procedure:  POLYPECTOMY;  Surgeon: [name], MD;  Location: [place];  Service: Endoscopy;;  ? SALIVARY GLAND SURGERY    ? SHOULDER ARTHROSCOPY Left 03/2014  ? bicep and nerve problems still  ? TONSILLECTOMY    ? UMBILICAL HERNIA REPAIR N/A 08/25/2015  ? Procedure: HERNIA REPAIR UMBILICAL ADULT;  Surgeon: Jules Husbands, MD;  Location: [facility];  Service: General;  Laterality: N/A;  ? ? ?Social History  ? ?Tobacco Use  ? Smoking status: Former  ?  Packs/day: 2.00  ?  Years: 33.00  ?  Pack years: 66.00  ?  Types: Cigarettes  ?  Start date: 06/28/1971  ?  Quit date: 12/25/2005  ?  Years since quitting: 15.6  ? Smokeless tobacco: Former  ?  Types: Chew  ?Vaping Use  ? Vaping Use: Never used  ?Substance Use Topics  ? Alcohol use: No  ?  Alcohol/week: 0.0 standard drinks  ? Drug use: Never  ? ? ? ?Medication list has been reviewed and updated. ? ?No outpatient medications have been marked as taking for the 08/27/21 encounter (Video Visit) with Glean Hess, MD.  ? ? ?PHQ 2/9 Scores 06/30/2021 03/01/2021 12/18/2020 06/18/2020  ?PHQ - 2 Score 0 0 0 0  ?PHQ- 9 Score 4 0 0 0  ? ? ?GAD 7 : Generalized Anxiety Score 06/30/2021 03/01/2021 12/18/2020 06/18/2020  ?Nervous, Anxious, on Edge 3 0 0 0  ?Control/stop worrying 1 0 0 0  ?Worry too much - different things 0 0 0 0  ?Trouble relaxing 0 0 0 0  ?Restless 0 0 0 0  ?Easily annoyed or irritable 0 0 0 0  ?Afraid - awful might happen 0 0 0 0  ?Total GAD 7 Score 4 0 0 0  ?Anxiety Difficulty Not difficult at all Not difficult at all Not difficult at all -  ? ? ?BP Readings from Last 3 Encounters:  ?06/30/21 110/70  ?02/19/21 120/69  ?02/03/21 131/80  ? ? ?Physical Exam ?Constitutional:   ?   Appearance: Normal appearance.  ?Pulmonary:  ?   Effort: Pulmonary effort is normal.  ?   Comments: Dry cough during the encounter; no wheezing appreiciated ?Neurological:  ?   Mental Status: He is alert.  ?Psychiatric:     ?   Mood and Affect: Mood normal.     ?   Behavior: Behavior normal.  ? ? ?Wt  Readings from Last 3 Encounters:  ?06/30/21 208 lb (94.3 kg)  ?02/19/21 193 lb (87.5 kg)  ?02/03/21 197 lb (89.4 kg)  ? ? ?Ht $Rem'6\' 1"'DPSM$  (1.854 m)   BMI 27.44 kg/m?  ? ?Assessment and Plan: ?1. Acute non-recurrent maxillary sinusitis ?Continue Zyrtec, add Coricidin HBP, push fluids ?Use albuterol for chest tightness before bed and as needed ?Follow up if symptoms persist or worsen. ?- albuterol (VENTOLIN HFA) 108 (90 Base) MCG/ACT inhaler; Inhale 2 puffs into the lungs every 6 (six) hours as needed for wheezing or shortness of breath.  Dispense: 1 each; Refill: 0 ?-  amoxicillin-clavulanate (AUGMENTIN) 875-125 MG tablet; Take 1 tablet by mouth 2 (two) times daily for 10 days.  Dispense: 20 tablet; Refill: 0 ?- promethazine-dextromethorphan (PROMETHAZINE-DM) 6.25-15 MG/5ML syrup; Take 5 mLs by mouth 4 (four) times daily as needed for cough.  Dispense: 118 mL; Refill: 0 ? ?I spent 6 minutes on this encounter,  100% of the time via video. ?Partially dictated using Editor, commissioning. Any errors are unintentional. ? ?Halina Maidens, MD ?[facility] ?[facility] ? ?08/27/2021 ? ? ? ? ? ?

## 2021-11-30 ENCOUNTER — Encounter: Payer: Self-pay | Admitting: Internal Medicine

## 2021-11-30 ENCOUNTER — Ambulatory Visit (INDEPENDENT_AMBULATORY_CARE_PROVIDER_SITE_OTHER): Payer: Medicare HMO | Admitting: Internal Medicine

## 2021-11-30 ENCOUNTER — Telehealth: Payer: Self-pay

## 2021-11-30 VITALS — BP 120/78 | HR 76 | Ht 73.0 in | Wt 208.0 lb

## 2021-11-30 DIAGNOSIS — N401 Enlarged prostate with lower urinary tract symptoms: Secondary | ICD-10-CM

## 2021-11-30 DIAGNOSIS — N138 Other obstructive and reflux uropathy: Secondary | ICD-10-CM

## 2021-11-30 DIAGNOSIS — Z23 Encounter for immunization: Secondary | ICD-10-CM

## 2021-11-30 DIAGNOSIS — B356 Tinea cruris: Secondary | ICD-10-CM

## 2021-11-30 DIAGNOSIS — T148XXA Other injury of unspecified body region, initial encounter: Secondary | ICD-10-CM | POA: Diagnosis not present

## 2021-11-30 MED ORDER — CLOTRIMAZOLE-BETAMETHASONE 1-0.05 % EX CREA: 1.0000 "application " | TOPICAL_CREAM | Freq: Every day | CUTANEOUS | 0 refills | Status: DC

## 2021-11-30 NOTE — Progress Notes (Signed)
Date:  11/30/2021   Name:  Jeffrey Stokes   DOB:  [birth year]   MRN:  [id number]   Chief Complaint: Benign Prostatic Hypertrophy  Benign Prostatic Hypertrophy This is a chronic problem. The problem is unchanged. Past treatments include tamsulosin (started flomax but not taking).  Rash This is a recurrent problem. The affected locations include the groin and genitalia. The rash is characterized by redness and itchiness. Pertinent negatives include no cough, diarrhea, fatigue or shortness of breath. Treatments tried: powder and otc sprays. The treatment provided no relief.   Animal bite - bitten by a dog at a client's house on 11/19/21 while working.  Bitten on the left lower leg.  Did not go to ED.  Rabies confirmed to be up to date on the dog.  Lab Results  Component Value Date   PSA1 0.7 06/30/2021   PSA1 0.5 06/18/2020   PSA1 0.5 05/29/2019     Lab Results  Component Value Date   NA 139 06/30/2021   K 4.4 06/30/2021   CO2 24 06/30/2021   GLUCOSE 111 (H) 06/30/2021   BUN 12 06/30/2021   CREATININE 1.05 06/30/2021   CALCIUM 9.3 06/30/2021   EGFR 82 06/30/2021   GFRNONAA 78 06/18/2020   Lab Results  Component Value Date   CHOL 202 (H) 06/30/2021   HDL 47 06/30/2021   LDLCALC 134 (H) 06/30/2021   TRIG 114 06/30/2021   CHOLHDL 4.3 06/30/2021   Lab Results  Component Value Date   TSH 1.440 10/26/2015   No results found for: HGBA1C Lab Results  Component Value Date   WBC 7.3 06/30/2021   HGB 16.7 06/30/2021   HCT 47.3 06/30/2021   MCV 95 06/30/2021   PLT 259 06/30/2021   Lab Results  Component Value Date   ALT 38 06/30/2021   AST 31 06/30/2021   ALKPHOS 85 06/30/2021   BILITOT 0.5 06/30/2021   No results found for: 25OHVITD2, 25OHVITD3, VD25OH   Review of Systems  Constitutional:  Positive for diaphoresis. Negative for fatigue and unexpected weight change.  HENT:  Negative for nosebleeds.   Eyes:  Negative for visual disturbance.  Respiratory:  Negative  for cough, chest tightness, shortness of breath and wheezing.   Cardiovascular:  Negative for chest pain, palpitations and leg swelling.  Gastrointestinal:  Negative for abdominal pain, constipation and diarrhea.  Genitourinary:  Positive for difficulty urinating (incomplete emptying). Negative for penile discharge, penile pain and penile swelling.  Skin:  Positive for rash and wound (healing puncture wound to left leg).  Neurological:  Negative for dizziness, weakness, light-headedness and headaches.  Psychiatric/Behavioral:  Negative for dysphoric mood. The patient is not nervous/anxious.    Patient Active Problem List   Diagnosis Date Noted   Mixed hyperlipidemia 06/30/2021   BPH with obstruction/lower urinary tract symptoms 06/30/2021   Bilateral carotid artery disease, unspecified type (HCC) 06/30/2021   History of colonic polyps    Polyp of transverse colon    Gastroesophageal reflux disease 03/14/2017   Ulnar neuritis, left 04/20/2016   Complex regional pain syndrome type 2 of left upper extremity 02/12/2016   Rhinitis, allergic 07/29/2015   LPRD (laryngopharyngeal reflux disease) 03/31/2015   Panic disorder without agoraphobia 03/31/2015   History of stroke 12/03/2014   HSV-2 infection 12/03/2014   Obstructive apnea 12/03/2014   Disorder of rotator cuff 12/03/2014   Mild atherosclerosis of carotid artery, bilateral     Allergies  Allergen Reactions   Hydrocodone Nausea Only and Anxiety  Lyrica [Pregabalin] Other (See Comments)    "drunk feeling"    Past Surgical History:  Procedure Laterality Date   COLONOSCOPY WITH PROPOFOL N/A 06/01/2015   Procedure: COLONOSCOPY WITH PROPOFOL;  Surgeon: [name], MD;  Location: [place];  Service: Endoscopy;  Laterality: N/A;  ASCENDING COLON POLYP DESCENDING COLON POLYP TRANSVERSE COLON POLYP   COLONOSCOPY WITH PROPOFOL N/A 02/19/2021   Procedure: COLONOSCOPY WITH PROPOFOL;  Surgeon: [name], MD;  Location:  [place];  Service: Endoscopy;  Laterality: N/A;   EYE SURGERY     Age 61    POLYPECTOMY  02/19/2021   Procedure: POLYPECTOMY;  Surgeon: [name], MD;  Location: [place];  Service: Endoscopy;;   SALIVARY GLAND SURGERY     SHOULDER ARTHROSCOPY Left 03/2014   bicep and nerve problems still   TONSILLECTOMY     UMBILICAL HERNIA REPAIR N/A 08/25/2015   Procedure: HERNIA REPAIR UMBILICAL ADULT;  Surgeon: Jules Husbands, MD;  Location: [facility];  Service: General;  Laterality: N/A;    Social History   Tobacco Use   Smoking status: Former    Packs/day: 2.00    Years: 33.00    Pack years: 66.00    Types: Cigarettes    Start date: 06/28/1971    Quit date: 12/25/2005    Years since quitting: 15.9   Smokeless tobacco: Former    Types: Nurse, children's Use: Never used  Substance Use Topics   Alcohol use: No    Alcohol/week: 0.0 standard drinks   Drug use: Never     Medication list has been reviewed and updated.  Current Meds  Medication Sig   albuterol (VENTOLIN HFA) 108 (90 Base) MCG/ACT inhaler Inhale 2 puffs into the lungs every 6 (six) hours as needed for wheezing or shortness of breath.   aspirin EC 325 MG EC tablet Take 1 tablet (325 mg total) by mouth daily. (Patient taking differently: Take 81 mg by mouth daily.)   Magnesium 300 MG CAPS Take by mouth.   Multiple Vitamins-Minerals (MULTIVITAMIN ADULT PO) Take 1 tablet by mouth daily. Reported on 09/23/2015   Multiple Vitamins-Minerals (ZINC PO) Take 1 tablet by mouth.   omeprazole (PRILOSEC) 20 MG capsule Take 20 mg by mouth daily.   potassium chloride (KLOR-CON) 20 MEQ packet Take by mouth 2 (two) times daily.   valACYclovir (VALTREX) 1000 MG tablet Take 1 tablet (1,000 mg total) by mouth daily.   zinc gluconate 50 MG tablet Take 50 mg by mouth daily.       06/30/2021   10:52 AM 03/01/2021    9:12 AM 12/18/2020    3:10 PM 06/18/2020   10:22 AM  GAD 7 : Generalized Anxiety Score  Nervous,  Anxious, on Edge 3 0 0 0  Control/stop worrying 1 0 0 0  Worry too much - different things 0 0 0 0  Trouble relaxing 0 0 0 0  Restless 0 0 0 0  Easily annoyed or irritable 0 0 0 0  Afraid - awful might happen 0 0 0 0  Total GAD 7 Score 4 0 0 0  Anxiety Difficulty Not difficult at all Not difficult at all Not difficult at all        06/30/2021   10:51 AM  Depression screen PHQ 2/9  Decreased Interest 0  Down, Depressed, Hopeless 0  PHQ - 2 Score 0  Altered sleeping 0  Tired, decreased energy 3  Change in appetite 1  Feeling bad or failure about yourself  0  Trouble concentrating 0  Moving slowly or fidgety/restless 0  Suicidal thoughts 0  PHQ-9 Score 4  Difficult doing work/chores Not difficult at all    BP Readings from Last 3 Encounters:  11/30/21 120/78  06/30/21 110/70  02/19/21 120/69    Physical Exam Vitals and nursing note reviewed.  Constitutional:      General: He is not in acute distress.    Appearance: He is well-developed.  HENT:     Head: Normocephalic and atraumatic.  Cardiovascular:     Rate and Rhythm: Normal rate and regular rhythm.  Pulmonary:     Effort: Pulmonary effort is normal. No respiratory distress.     Breath sounds: No wheezing or rhonchi.  Genitourinary:    Pubic Area: No pubic lice.      Penis: Circumcised. Erythema (of the shaft) present. No discharge or lesions.      Testes: Normal.     Comments: Mild inflammation/redness under both testes Musculoskeletal:     Cervical back: Normal range of motion.  Skin:    General: Skin is warm and dry.     Findings: No rash.  Neurological:     Mental Status: He is alert and oriented to person, place, and time.  Psychiatric:        Mood and Affect: Mood normal.        Behavior: Behavior normal.    Wt Readings from Last 3 Encounters:  11/30/21 208 lb (94.3 kg)  08/27/21 208 lb (94.3 kg)  06/30/21 208 lb (94.3 kg)    BP 120/78   Pulse 76   Ht $R'6\' 1"'CV$  (1.854 m)   Wt 208 lb (94.3 kg)    SpO2 95%   BMI 27.44 kg/m   Assessment and Plan: 1. BPH with obstruction/lower urinary tract symptoms Persistent symptoms unchanged. ++fam hx prostate cancer so will repeat PSA Start Flomax previously prescribed. - PSA  2. Animal bite Sustained on 11/19/21 to left lower leg.  Dog was up to date on Rabies vaccines. Healing well currently but Tetanus needs updating. - Tdap vaccine greater than or equal to 7yo IM  3. Tinea cruris Continue measures to reduce perspiration Allow good ventilation, use Gold Bond powder Apply Lotrisone once a day to shaft and testes. - clotrimazole-betamethasone (LOTRISONE) cream; Apply 1 application. topically daily. To shaft of penis and under the scrotum  Dispense: 30 g; Refill: 0   Partially dictated using Editor, commissioning. Any errors are unintentional.  Halina Maidens, MD [facility]  11/30/2021

## 2021-11-30 NOTE — Patient Instructions (Signed)
Start Tamsulosin at bedtime to help you empty your bladder completely.

## 2021-11-30 NOTE — Telephone Encounter (Signed)
ERROR

## 2021-12-01 LAB — PSA: Prostate Specific Ag, Serum: 0.8 ng/mL (ref 0.0–4.0)

## 2021-12-07 ENCOUNTER — Ambulatory Visit: Payer: Self-pay | Admitting: *Deleted

## 2021-12-07 NOTE — Telephone Encounter (Signed)
  Chief Complaint: coughing up green sputum, runny nose, requesting medication per spouse  Symptoms: shortness of breath with exertion. Productive cough green , runny nose blows nose for green and bloody sputum. Hoarse. No fever today  Frequency: 11/30/21 Pertinent Negatives: Patient denies chest pain fever no difficulty breathing  Disposition: '[]'$ ED /'[]'$ Urgent Care (no appt availability in office) / '[]'$ Appointment(In office/virtual)/ '[]'$  [place] [facility]/ '[]'$ Home Care/ '[]'$ Refused Recommended Disposition /'[]'$ [facility]/ '[x]'$  Follow-up with PCP Additional Notes:  No available appt today . Patient reports he has appt tomorrow and can not come to office. Please advise if VV ok today and patient would like a call back . Reason for Disposition  Cough has been present for > 3 weeks    Present since last Tuesday 11/30/21  Answer Assessment - Initial Assessment Questions 1. ONSET: "When did the cough begin?"      Last Tuesday  2. SEVERITY: "How bad is the cough today?"      worse 3. SPUTUM: "Describe the color of your sputum" (none, dry cough; clear, white, yellow, green)     Green  4. HEMOPTYSIS: "Are you coughing up any blood?" If so ask: "How much?" (flecks, streaks, tablespoons, etc.)     No only noted when blowing nose  5. DIFFICULTY BREATHING: "Are you having difficulty breathing?" If Yes, ask: "How bad is it?" (e.g., mild, moderate, severe)    - MILD: No SOB at rest, mild SOB with walking, speaks normally in sentences, can lie down, no retractions, pulse < 100.    - MODERATE: SOB at rest, SOB with minimal exertion and prefers to sit, cannot lie down flat, speaks in phrases, mild retractions, audible wheezing, pulse 100-120.    - SEVERE: Very SOB at rest, speaks in single words, struggling to breathe, sitting hunched forward, retractions, pulse > 120      Moderate shortness of breath with exertion  6. FEVER: "Do you have a fever?" If Yes, ask: "What is your temperature, how was it  measured, and when did it start?"     no 7. CARDIAC HISTORY: "Do you have any history of heart disease?" (e.g., heart attack, congestive heart failure)      No , hx stroke  8. LUNG HISTORY: "Do you have any history of lung disease?"  (e.g., pulmonary embolus, asthma, emphysema)     No  9. PE RISK FACTORS: "Do you have a history of blood clots?" (or: recent major surgery, recent prolonged travel, bedridden)     no 10. OTHER SYMPTOMS: "Do you have any other symptoms?" (e.g., runny nose, wheezing, chest pain)       Runny nose  11. PREGNANCY: "Is there any chance you are pregnant?" "When was your last menstrual period?"       na 12. TRAVEL: "Have you traveled out of the country in the last month?" (e.g., travel history, exposures)       na  Protocols used: Cough - Acute Productive-A-AH

## 2021-12-07 NOTE — Telephone Encounter (Signed)
Called pt and left VM letting him know we need to see him in person to be able to evaluate him properly. Asked him to call back and make appt at his convenience.

## 2021-12-08 DIAGNOSIS — Z0189 Encounter for other specified special examinations: Secondary | ICD-10-CM | POA: Diagnosis not present

## 2021-12-08 DIAGNOSIS — M25511 Pain in right shoulder: Secondary | ICD-10-CM | POA: Diagnosis not present

## 2021-12-09 ENCOUNTER — Encounter (HOSPITAL_COMMUNITY): Payer: Self-pay | Admitting: *Deleted

## 2021-12-09 ENCOUNTER — Emergency Department (HOSPITAL_COMMUNITY): Payer: Medicare HMO

## 2021-12-09 ENCOUNTER — Other Ambulatory Visit: Payer: Self-pay

## 2021-12-09 ENCOUNTER — Emergency Department (HOSPITAL_COMMUNITY)
Admission: EM | Admit: 2021-12-09 | Discharge: 2021-12-09 | Disposition: A | Discharge status: Home / Self Care | Payer: Medicare HMO | Attending: Student | Admitting: Student

## 2021-12-09 DIAGNOSIS — S39012A Strain of muscle, fascia and tendon of lower back, initial encounter: Secondary | ICD-10-CM | POA: Diagnosis not present

## 2021-12-09 DIAGNOSIS — Z87891 Personal history of nicotine dependence: Secondary | ICD-10-CM | POA: Insufficient documentation

## 2021-12-09 DIAGNOSIS — M549 Dorsalgia, unspecified: Secondary | ICD-10-CM | POA: Insufficient documentation

## 2021-12-09 DIAGNOSIS — D72829 Elevated white blood cell count, unspecified: Secondary | ICD-10-CM | POA: Insufficient documentation

## 2021-12-09 DIAGNOSIS — Z7982 Long term (current) use of aspirin: Secondary | ICD-10-CM | POA: Insufficient documentation

## 2021-12-09 DIAGNOSIS — J189 Pneumonia, unspecified organism: Secondary | ICD-10-CM | POA: Diagnosis not present

## 2021-12-09 DIAGNOSIS — R109 Unspecified abdominal pain: Secondary | ICD-10-CM | POA: Insufficient documentation

## 2021-12-09 DIAGNOSIS — R001 Bradycardia, unspecified: Secondary | ICD-10-CM | POA: Diagnosis not present

## 2021-12-09 DIAGNOSIS — I2510 Atherosclerotic heart disease of native coronary artery without angina pectoris: Secondary | ICD-10-CM | POA: Diagnosis not present

## 2021-12-09 DIAGNOSIS — K5730 Diverticulosis of large intestine without perforation or abscess without bleeding: Secondary | ICD-10-CM | POA: Diagnosis not present

## 2021-12-09 DIAGNOSIS — R110 Nausea: Secondary | ICD-10-CM | POA: Diagnosis not present

## 2021-12-09 DIAGNOSIS — I700 Atherosclerosis of aorta: Secondary | ICD-10-CM | POA: Diagnosis not present

## 2021-12-09 LAB — CBC WITH DIFFERENTIAL/PLATELET
Abs Immature Granulocytes: 0.41 10*3/uL — ABNORMAL HIGH (ref 0.00–0.07)
Basophils Absolute: 0.1 10*3/uL (ref 0.0–0.1)
Basophils Relative: 1 %
Eosinophils Absolute: 0.2 10*3/uL (ref 0.0–0.5)
Eosinophils Relative: 2 %
HCT: 42.7 % (ref 39.0–52.0)
Hemoglobin: 15.2 g/dL (ref 13.0–17.0)
Immature Granulocytes: 3 %
Lymphocytes Relative: 25 %
Lymphs Abs: 3.1 10*3/uL (ref 0.7–4.0)
MCH: 33.9 pg (ref 26.0–34.0)
MCHC: 35.6 g/dL (ref 30.0–36.0)
MCV: 95.1 fL (ref 80.0–100.0)
Monocytes Absolute: 1.1 10*3/uL — ABNORMAL HIGH (ref 0.1–1.0)
Monocytes Relative: 9 %
Neutro Abs: 7.7 10*3/uL (ref 1.7–7.7)
Neutrophils Relative %: 60 %
Platelets: 319 10*3/uL (ref 150–400)
RBC: 4.49 MIL/uL (ref 4.22–5.81)
RDW: 12.8 % (ref 11.5–15.5)
WBC: 12.6 10*3/uL — ABNORMAL HIGH (ref 4.0–10.5)
nRBC: 0 % (ref 0.0–0.2)

## 2021-12-09 LAB — COMPREHENSIVE METABOLIC PANEL
ALT: 36 U/L (ref 0–44)
AST: 24 U/L (ref 15–41)
Albumin: 4.3 g/dL (ref 3.5–5.0)
Alkaline Phosphatase: 78 U/L (ref 38–126)
Anion gap: 11 (ref 5–15)
BUN: 13 mg/dL (ref 6–20)
CO2: 25 mmol/L (ref 22–32)
Calcium: 9.8 mg/dL (ref 8.9–10.3)
Chloride: 105 mmol/L (ref 98–111)
Creatinine, Ser: 1.11 mg/dL (ref 0.61–1.24)
GFR, Estimated: >60 mL/min (ref >60)
Glucose, Bld: 130 mg/dL — ABNORMAL HIGH (ref 70–99)
Potassium: 4.6 mmol/L (ref 3.5–5.1)
Sodium: 141 mmol/L (ref 135–145)
Total Bilirubin: 0.5 mg/dL (ref 0.3–1.2)
Total Protein: 7.9 g/dL (ref 6.5–8.1)

## 2021-12-09 LAB — URINALYSIS, ROUTINE W REFLEX MICROSCOPIC
Bilirubin Urine: NEGATIVE
Glucose, UA: NEGATIVE mg/dL
Hgb urine dipstick: NEGATIVE
Ketones, ur: 5 mg/dL — AB
Leukocytes,Ua: NEGATIVE
Nitrite: NEGATIVE
Protein, ur: NEGATIVE mg/dL
Specific Gravity, Urine: >1.046 — ABNORMAL HIGH (ref 1.005–1.030)
pH: 8 (ref 5.0–8.0)

## 2021-12-09 LAB — LIPASE, BLOOD: Lipase: 34 U/L (ref 11–51)

## 2021-12-09 MED ORDER — ONDANSETRON HCL 4 MG/2ML IJ SOLN
4.0000 mg | Freq: Once | INTRAMUSCULAR | Status: AC
  Administered 2021-12-09: 4 mg via INTRAVENOUS
  Filled 2021-12-09: qty 2

## 2021-12-09 MED ORDER — DOXYCYCLINE HYCLATE 100 MG PO CAPS: 100.0000 mg | ORAL_CAPSULE | Freq: Two times a day (BID) | ORAL | 0 refills | Status: AC

## 2021-12-09 MED ORDER — KETOROLAC TROMETHAMINE 15 MG/ML IJ SOLN
15.0000 mg | Freq: Once | INTRAMUSCULAR | Status: AC
  Administered 2021-12-09: 15 mg via INTRAVENOUS
  Filled 2021-12-09: qty 1

## 2021-12-09 MED ORDER — NALOXONE HCL 0.4 MG/ML IJ SOLN
0.4000 mg | Freq: Once | INTRAMUSCULAR | Status: AC
  Administered 2021-12-09: 0.4 mg via INTRAVENOUS
  Filled 2021-12-09: qty 1

## 2021-12-09 MED ORDER — MORPHINE SULFATE (PF) 4 MG/ML IV SOLN
4.0000 mg | Freq: Once | INTRAVENOUS | Status: AC
  Administered 2021-12-09: 4 mg via INTRAVENOUS
  Filled 2021-12-09: qty 1

## 2021-12-09 MED ORDER — SODIUM CHLORIDE 0.9 % IV SOLN
1.0000 g | Freq: Once | INTRAVENOUS | Status: AC
  Administered 2021-12-09: 1 g via INTRAVENOUS
  Filled 2021-12-09: qty 10

## 2021-12-09 MED ORDER — IOHEXOL 350 MG/ML SOLN
100.0000 mL | Freq: Once | INTRAVENOUS | Status: AC | PRN
  Administered 2021-12-09: 100 mL via INTRAVENOUS

## 2021-12-09 MED ORDER — DOXYCYCLINE HYCLATE 100 MG PO TABS
100.0000 mg | ORAL_TABLET | Freq: Once | ORAL | Status: AC
  Administered 2021-12-09: 100 mg via ORAL
  Filled 2021-12-09: qty 1

## 2021-12-09 MED ORDER — HYDROMORPHONE HCL 1 MG/ML IJ SOLN
1.0000 mg | Freq: Once | INTRAMUSCULAR | Status: AC
  Administered 2021-12-09: 1 mg via INTRAVENOUS
  Filled 2021-12-09: qty 1

## 2021-12-09 MED ORDER — LIDOCAINE 5 % EX PTCH
1.0000 | MEDICATED_PATCH | CUTANEOUS | Status: DC
  Administered 2021-12-09: 1 via TRANSDERMAL
  Filled 2021-12-09: qty 1

## 2021-12-09 MED ORDER — LACTATED RINGERS IV BOLUS
1000.0000 mL | Freq: Once | INTRAVENOUS | Status: AC
  Administered 2021-12-09: 1000 mL via INTRAVENOUS

## 2021-12-09 NOTE — ED Triage Notes (Signed)
Pt with right lower back pain suddenly with N/V while driving home.   Pt cool and clammy.  Unable to obtain oral temperature

## 2021-12-09 NOTE — ED Provider Notes (Signed)
This is a 61 year old male who I received a signout from an earlier provider who came in with complaint of worsening pain in his back.  On further questioning the patient reports that he has had a cough for about 3 weeks, reports some fevers at home, productive sputum.  His work-up today included dissection CT scan, which thankfully did not show any acute aortic pathology, or renal stones, but did note he is got some pulmonary clusters which could be infectious.  He also has a mild leukocytosis.  He is not hypoxic.  He was given several rounds of IV pain medications, subsequently did become hypopneic and mildly bradycardic, was given a small dose of Narcan and quickly recovered.  His wife is now present at the bedside.  We will treat with IV Rocephin and doxycycline for community pneumonia.  We are awaiting a UA.  I would anticipate discharge home, I suspect that the pain that he is experiencing mostly in the right flank is likely muscular in nature, possibly related to his coughing at home.  I have a lower suspicion for pyelonephritis at this time.  UA reviewed, no evidence of infection.  Will complete antibiotics and discharge   Wyvonnia Dusky, MD 12/09/21 [id number]

## 2021-12-09 NOTE — ED Notes (Signed)
Patient transported to CT 

## 2021-12-09 NOTE — ED Provider Notes (Signed)
[facility] EMERGENCY DEPARTMENT Provider Note  CSN: [id number] Arrival date & time: 12/09/21 1236  Chief Complaint(s) Back Pain  HPI Jeffrey Stokes is a 61 y.o. male who presents emergency department for evaluation of back pain.  Patient states that he was driving when he suffered acute onset left flank pain and arrives in extremis.  Patient states he has never felt pain like this before.  He denies associated chest pain, shortness of breath, headache, fever or other systemic symptoms.  He currently endorses nausea from the pain.   Past Medical History Past Medical History:  Diagnosis Date   Allergy    Anxiety    Benign neoplasm of ascending colon    Benign neoplasm of descending colon    Benign neoplasm of transverse colon    Depression    GERD (gastroesophageal reflux disease)    History of motion sickness    laying on back   HOH (hard of hearing)    bilateral   Hx-TIA (transient ischemic attack) 2012   Sleep apnea    had one sleep study, inconclusive,needs repeat   Stroke ([place])    3 strokes last one on 2012/ mouth tilt/ weakness right hand side   Wears dentures    full upper and lower   Patient Active Problem List   Diagnosis Date Noted   Mixed hyperlipidemia 06/30/2021   BPH with obstruction/lower urinary tract symptoms 06/30/2021   Bilateral carotid artery disease, unspecified type ([place]) 06/30/2021   History of colonic polyps    Polyp of transverse colon    Gastroesophageal reflux disease 03/14/2017   Ulnar neuritis, left 04/20/2016   Complex regional pain syndrome type 2 of left upper extremity 02/12/2016   Rhinitis, allergic 07/29/2015   LPRD (laryngopharyngeal reflux disease) 03/31/2015   Panic disorder without agoraphobia 03/31/2015   History of stroke 12/03/2014   HSV-2 infection 12/03/2014   Obstructive apnea 12/03/2014   Disorder of rotator cuff 12/03/2014   Mild atherosclerosis of carotid artery, bilateral    Home Medication(s) Prior to Admission  medications   Medication Sig Start Date End Date Taking? Authorizing Provider  albuterol (VENTOLIN HFA) 108 (90 Base) MCG/ACT inhaler Inhale 2 puffs into the lungs every 6 (six) hours as needed for wheezing or shortness of breath. 08/27/21   Glean Hess, MD  aspirin EC 325 MG EC tablet Take 1 tablet (325 mg total) by mouth daily. Patient taking differently: Take 81 mg by mouth daily. 07/03/16   Fritzi Mandes, MD  clotrimazole-betamethasone (LOTRISONE) cream Apply 1 application. topically daily. To shaft of penis and under the scrotum 11/30/21   Glean Hess, MD  Magnesium 300 MG CAPS Take by mouth.    [provider]  Multiple Vitamins-Minerals (MULTIVITAMIN ADULT PO) Take 1 tablet by mouth daily. Reported on 09/23/2015    [provider]  Multiple Vitamins-Minerals (ZINC PO) Take 1 tablet by mouth.    [provider]  omeprazole (PRILOSEC) 20 MG capsule Take 20 mg by mouth daily.    [provider]  potassium chloride (KLOR-CON) 20 MEQ packet Take by mouth 2 (two) times daily.    [provider]  valACYclovir (VALTREX) 1000 MG tablet Take 1 tablet (1,000 mg total) by mouth daily. 12/18/20   Glean Hess, MD  zinc gluconate 50 MG tablet Take 50 mg by mouth daily.    [provider]  Past Surgical History Past Surgical History:  Procedure Laterality Date   COLONOSCOPY WITH PROPOFOL N/A 06/01/2015   Procedure: COLONOSCOPY WITH PROPOFOL;  Surgeon: [name], MD;  Location: [place];  Service: Endoscopy;  Laterality: N/A;  ASCENDING COLON POLYP DESCENDING COLON POLYP TRANSVERSE COLON POLYP   COLONOSCOPY WITH PROPOFOL N/A 02/19/2021   Procedure: COLONOSCOPY WITH PROPOFOL;  Surgeon: [name], MD;  Location: [place];  Service: Endoscopy;  Laterality: N/A;   EYE SURGERY     Age 23     POLYPECTOMY  02/19/2021   Procedure: POLYPECTOMY;  Surgeon: [name], MD;  Location: [place];  Service: Endoscopy;;   SALIVARY GLAND SURGERY     SHOULDER ARTHROSCOPY Left 03/2014   bicep and nerve problems still   TONSILLECTOMY     UMBILICAL HERNIA REPAIR N/A 08/25/2015   Procedure: HERNIA REPAIR UMBILICAL ADULT;  Surgeon: Jules Husbands, MD;  Location: [facility];  Service: General;  Laterality: N/A;   Family History Family History  Problem Relation Age of Onset   Osteoarthritis Father    Stroke Father     Social History Social History   Tobacco Use   Smoking status: Former    Packs/day: 2.00    Years: 33.00    Total pack years: 66.00    Types: Cigarettes    Start date: 06/28/1971    Quit date: 12/25/2005    Years since quitting: 15.9   Smokeless tobacco: Former    Types: Nurse, children's Use: Never used  Substance Use Topics   Alcohol use: No    Alcohol/week: 0.0 standard drinks of alcohol   Drug use: Never   Allergies Hydrocodone and Lyrica [pregabalin]  Review of Systems Review of Systems  Gastrointestinal:  Positive for abdominal pain.  Musculoskeletal:  Positive for back pain.    Physical Exam Vital Signs  I have reviewed the triage vital signs BP 110/80   Pulse (!) 42   Temp (!) 97.5 F (36.4 C)   Resp (!) 25   SpO2 98%   Physical Exam Constitutional:      General: He is in acute distress.     Appearance: Normal appearance. He is ill-appearing.  HENT:     Head: Normocephalic and atraumatic.     Nose: No congestion or rhinorrhea.  Eyes:     General:        Right eye: No discharge.        Left eye: No discharge.     Extraocular Movements: Extraocular movements intact.     Pupils: Pupils are equal, round, and reactive to light.  Cardiovascular:     Rate and Rhythm: Normal rate and regular rhythm.     Heart sounds: No murmur heard. Pulmonary:     Effort: No respiratory distress.     Breath sounds: No wheezing or rales.   Abdominal:     General: There is no distension.     Tenderness: There is no abdominal tenderness. There is right CVA tenderness.  Musculoskeletal:        General: Normal range of motion.     Cervical back: Normal range of motion.  Skin:    General: Skin is warm and dry.  Neurological:     General: No focal deficit present.     Mental Status: He is alert.     ED Results and Treatments Labs (all labs ordered are listed, but only abnormal results are displayed) Labs Reviewed  CBC WITH  DIFFERENTIAL/PLATELET - Abnormal; Notable for the following components:      Result Value   WBC 12.6 (*)    Monocytes Absolute 1.1 (*)    Abs Immature Granulocytes 0.41 (*)    All other components within normal limits  COMPREHENSIVE METABOLIC PANEL - Abnormal; Notable for the following components:   Glucose, Bld 130 (*)    All other components within normal limits  LIPASE, BLOOD  URINALYSIS, ROUTINE W REFLEX MICROSCOPIC                                                                                                                          Radiology CT Angio Chest/Abd/Pel for Dissection W and/or Wo Contrast  Result Date: 12/09/2021 CLINICAL DATA:  Right lower back pain EXAM: CT ANGIOGRAPHY CHEST, ABDOMEN AND PELVIS TECHNIQUE: Non-contrast CT of the chest was initially obtained. Multidetector CT imaging through the chest, abdomen and pelvis was performed using the standard protocol during bolus administration of intravenous contrast. Multiplanar reconstructed images and MIPs were obtained and reviewed to evaluate the vascular anatomy. RADIATION DOSE REDUCTION: This exam was performed according to the departmental dose-optimization program which includes automated exposure control, adjustment of the mA and/or kV according to patient size and/or use of iterative reconstruction technique. CONTRAST:  156m OMNIPAQUE IOHEXOL 350 MG/ML SOLN COMPARISON:  None FINDINGS: CTA CHEST FINDINGS Cardiovascular: Normal  heart size. No pericardial effusion. Moderate three-vessel coronary artery calcifications. No suspicious central filling defects of the pulmonary arteries. Mediastinum/Nodes: Esophagus and thyroid are unremarkable. No pathologically enlarged lymph nodes seen in the chest. Lungs/Pleura: Central airways are patent. Paraseptal emphysema. Clustered solid nodules are seen in the bilateral lower lobes on series 7 image 123 and 119, largest solid nodule is located in the left lower lobe and measures 1.0 mm on series 7, image 122. Musculoskeletal: No chest wall abnormality. No acute or significant osseous findings. Review of the MIP images confirms the above findings. CTA ABDOMEN AND PELVIS FINDINGS VASCULAR Aorta: Normal caliber aorta without aneurysm, dissection, vasculitis or significant stenosis. Atherosclerotic disease of the abdominal aorta. Celiac: Patent without evidence of aneurysm, dissection, vasculitis or significant stenosis. SMA: Patent without evidence of aneurysm, dissection, vasculitis or significant stenosis. Renals: Both renal arteries are patent without evidence of aneurysm, dissection, vasculitis, fibromuscular dysplasia or significant stenosis. IMA: Patent without evidence of aneurysm, dissection, vasculitis or significant stenosis. Inflow: Patent without evidence of aneurysm, dissection, vasculitis or significant stenosis. Moderate atherosclerotic disease of the iliac and femoral arteries. Veins: No obvious venous abnormality within the limitations of this arterial phase study. Review of the MIP images confirms the above findings. NON-VASCULAR Hepatobiliary: No focal liver abnormality is seen. No gallstones, gallbladder wall thickening, or biliary dilatation. Pancreas: Unremarkable. No pancreatic ductal dilatation or surrounding inflammatory changes. Spleen: Normal in size without focal abnormality. Adrenals/Urinary Tract: Adrenal glands are unremarkable. Kidneys are normal, without renal calculi,  focal lesion, or hydronephrosis. Bladder is unremarkable. Stomach/Bowel: Stomach is within normal limits. Appendix appears normal. Diverticulosis. No  evidence of bowel wall thickening, distention, or inflammatory changes. Lymphatic: No pathologically enlarged lymph nodes seen in the abdomen or pelvis. Reproductive: Mild prostatomegaly Other: Small fat containing left inguinal hernia. No abdominopelvic ascites. Musculoskeletal: Mild grade 1 anterolisthesis of L4 on L5 with chronic bilateral pars defects. No aggressive appearing osseous lesions. Review of the MIP images confirms the above findings. IMPRESSION: 1. Normal caliber aorta with no evidence of acute aortic syndrome. 2. Bilateral clustered solid nodules are seen in the lower lobes, largest measures 1.0 cm, likely infectious with follow-up CT recommended to ensure resolution. Consider one of the following in 3 months for both low-risk and high-risk individuals: (a) repeat chest CT, (b) follow-up PET-CT, or (c) tissue sampling. This recommendation follows the consensus statement: Guidelines for Management of Incidental Pulmonary Nodules Detected on CT Images: From the Fleischner Society 2017; Radiology 2017; 284:228-243. 3. Aortic Atherosclerosis (ICD10-I70.0) and Emphysema (ICD10-J43.9). Electronically Signed   By: Yetta Glassman M.D.   On: 12/09/2021 14:48   CT Renal Stone Study  Result Date: 12/09/2021 CLINICAL DATA:  Right lower back pain EXAM: CT ABDOMEN AND PELVIS WITHOUT CONTRAST TECHNIQUE: Multidetector CT imaging of the abdomen and pelvis was performed following the standard protocol without IV contrast. RADIATION DOSE REDUCTION: This exam was performed according to the departmental dose-optimization program which includes automated exposure control, adjustment of the mA and/or kV according to patient size and/or use of iterative reconstruction technique. COMPARISON:  None Available. FINDINGS: Lower chest: Small patchy nodular infiltrates are  seen in the lingula and both lower lobes. There is no pleural effusion. There are scattered coronary artery calcifications. Hepatobiliary: No focal abnormality is seen in the liver. There is no dilation of bile ducts. Gallbladder is unremarkable. Pancreas: No focal abnormality is seen. Spleen: Unremarkable. Adrenals/Urinary Tract: Adrenals are unremarkable. There is no hydronephrosis. There are no renal or ureteral stones. Urinary bladder is unremarkable. Stomach/Bowel: Stomach is unremarkable. Small bowel loops are not dilated. Appendix is not dilated. There is no significant wall thickening in colon. Scattered diverticula are seen in the colon without signs of focal diverticulitis. Vascular/Lymphatic: Scattered arterial calcifications are seen. Reproductive: There are coarse calcifications in the prostate. Other: There is no ascites or pneumoperitoneum. Left inguinal hernia containing fat is seen. Musculoskeletal: Degenerative changes are noted in the lower lumbar spine. Pars interarticularis defect is seen in the L5 vertebra. There is grade 1 anterolisthesis at L5-S1 level. There is disc space narrowing and encroachment of neural foramina at L5-S1 level. IMPRESSION: There is no evidence of intestinal obstruction or pneumoperitoneum. There is no hydronephrosis. Appendix is not dilated. Diverticulosis of colon without signs of focal diverticulitis. Lumbar spondylosis at L5-S1 level with encroachment of neural foramina. There are small patchy reticulonodular densities in both lower lung fields suggesting scarring or pneumonia. Other findings as described in the body of the report. Electronically Signed   By: Elmer Picker M.D.   On: 12/09/2021 13:54    Pertinent labs & imaging results that were available during my care of the patient were reviewed by me and considered in my medical decision making (see MDM for details).  Medications Ordered in ED Medications  lidocaine (LIDODERM) 5 % 1 patch (1 patch  Transdermal Patch Applied 12/09/21 1520)  morphine (PF) 4 MG/ML injection 4 mg (4 mg Intravenous Given 12/09/21 1304)  ondansetron (ZOFRAN) injection 4 mg (4 mg Intravenous Given 12/09/21 1303)  lactated ringers bolus 1,000 mL (0 mLs Intravenous Stopped 12/09/21 1520)  HYDROmorphone (DILAUDID) injection 1 mg (1 mg  Intravenous Given 12/09/21 1348)  iohexol (OMNIPAQUE) 350 MG/ML injection 100 mL (100 mLs Intravenous Contrast Given 12/09/21 1420)  ketorolac (TORADOL) 15 MG/ML injection 15 mg (15 mg Intravenous Given 12/09/21 1520)  ondansetron (ZOFRAN) injection 4 mg (4 mg Intravenous Given 12/09/21 1519)                                                                                                                                     Procedures Procedures  (including critical care time)  Medical Decision Making / ED Course   This patient presents to the ED for concern of flank pain, this involves an extensive number of treatment options, and is a complaint that carries with it a high risk of complications and morbidity.  The differential diagnosis includes nephrolithiasis, aortic dissection, muscle spasm, intra-abdominal infection, AAA  MDM: Patient seen emergency room for evaluation of flank pain.  Physical exam with a very uncomfortable appearing patient with tenderness in the right CVA.  Laboratory evaluation with leukocytosis to 12.6 which is likely stress demargination from his pain.  CMP and lipase unremarkable.  Initial CT stone study negative.  On reevaluation, patient still has persistent 10 out of 10 pain and given rapid onset of this presentation, I had initial concern for aortic dissection, and thus a CT aortogram was performed that was reassuringly negative for aortic dissection.  Patient received multiple doses of pain medication including morphine and Dilaudid and on reevaluation after CT aortogram, patient is having some shortness of breath which is almost certainly from the opioid  administration.  At time of signout, patient will be observed in the emergency department until these symptoms resolve.  Please see provider signout for continuation of work-up.  Anticipate discharge home with diagnosis of a muscles spasm.   Additional history obtained: -Additional history obtained from wife -External records from outside source obtained and reviewed including: Chart review including previous notes, labs, imaging, consultation notes   Lab Tests: -I ordered, reviewed, and interpreted labs.   The pertinent results include:   Labs Reviewed  CBC WITH DIFFERENTIAL/PLATELET - Abnormal; Notable for the following components:      Result Value   WBC 12.6 (*)    Monocytes Absolute 1.1 (*)    Abs Immature Granulocytes 0.41 (*)    All other components within normal limits  COMPREHENSIVE METABOLIC PANEL - Abnormal; Notable for the following components:   Glucose, Bld 130 (*)    All other components within normal limits  LIPASE, BLOOD  URINALYSIS, ROUTINE W REFLEX MICROSCOPIC      Imaging Studies ordered: I ordered imaging studies including CT stone study, CT aortogram I independently visualized and interpreted imaging. I agree with the radiologist interpretation   Medicines ordered and prescription drug management: Meds ordered this encounter  Medications   morphine (PF) 4 MG/ML injection 4 mg   ondansetron (ZOFRAN) injection 4 mg   lactated ringers bolus 1,000 mL  HYDROmorphone (DILAUDID) injection 1 mg   iohexol (OMNIPAQUE) 350 MG/ML injection 100 mL   ketorolac (TORADOL) 15 MG/ML injection 15 mg   ondansetron (ZOFRAN) injection 4 mg   lidocaine (LIDODERM) 5 % 1 patch    -I have reviewed the patients home medicines and have made adjustments as needed  Critical interventions none    Cardiac Monitoring: The patient was maintained on a cardiac monitor.  I personally viewed and interpreted the cardiac monitored which showed an underlying rhythm of: NSR, sinus  brady  Social Determinants of Health:  Factors impacting patients care include: none   Reevaluation: After the interventions noted above, I reevaluated the patient and found that they have :improved  Co morbidities that complicate the patient evaluation  Past Medical History:  Diagnosis Date   Allergy    Anxiety    Benign neoplasm of ascending colon    Benign neoplasm of descending colon    Benign neoplasm of transverse colon    Depression    GERD (gastroesophageal reflux disease)    History of motion sickness    laying on back   HOH (hard of hearing)    bilateral   Hx-TIA (transient ischemic attack) 2012   Sleep apnea    had one sleep study, inconclusive,needs repeat   Stroke ([place])    3 strokes last one on 2012/ mouth tilt/ weakness right hand side   Wears dentures    full upper and lower      Dispostion: I considered admission for this patient, and disposition pending reevaluation by oncoming provider.  Please see provider signout for continuation of work-up.     Final Clinical Impression(s) / ED Diagnoses Final diagnoses:  None     '@PCDICTATION'$ @    Teressa Lower, MD 12/09/21 1601

## 2021-12-09 NOTE — Discharge Instructions (Addendum)
Please call your doctor's office schedule follow-up appointment early next week to see how you are feeling after antibiotics.

## 2021-12-10 ENCOUNTER — Telehealth: Payer: Self-pay

## 2021-12-10 ENCOUNTER — Telehealth: Payer: Self-pay | Admitting: Internal Medicine

## 2021-12-10 NOTE — Telephone Encounter (Signed)
Spoke to pts wife Jeffrey Stokes. She was upset that her husband was in the ER on 12/09/21. She stated he was in the ER after having a Tdap shot after a dog bite. I explained to her that the patients symptoms happened outside of the timeframe for side effects from the Tdap shot which is 12-24 hours. Pt went to the ER 9 days after receiving the Tdap shot. Pt stated she called in to get the pt an appt. And the [facility] stated that they would try to schedule the pt a VV. She stated the pt was called back and received a VM since the pt did not pick up when he was called. She stated that she wanted someone to call her phone and not her husbands phone to schedule. She said the VM stated that the patient would need to be seen in our office. I explain to her that the patient had a VV 3/23 for sinus that if he was still having sinus infection symptoms as well as SOB that he needed to come into our office so Dr. Army Melia could listen to his lungs and/ or do a chest xray. She verbalized that "the lady that I talked to did not tell me that all she said was she would try to get a VV". Explained that when she called in she talked to our [place]. She then said "you mean to tell me the Gianne Shugars I was talking to was not even in your office." I replied "yes". She then stated that "this conversation is over since I was talking to someone that wasn't in the office I don't want to talk anymore and I'm not sure if he will even come back to the office again. I will let my husband know this."She then hung up abruptly.  KP

## 2021-12-10 NOTE — Telephone Encounter (Signed)
Called Brenda left VM to call back.  We seen pt on 11/30/21 for a dog bite. Pt went to the ER 9 days later. Tdap has a side effect of fever but around 12-24 hours after the injection and goes away. A sinus infection is not a side effect from a Tdap shot. The symptoms pt was having on 12/09/21 does not seem to be related to getting the tdap shot. If patient doesn't get better we can schedule an appointment for pt to be seen.   KP

## 2021-12-10 NOTE — Telephone Encounter (Signed)
Already addressed in duplicate TE today.

## 2021-12-10 NOTE — Telephone Encounter (Signed)
Copied from [place] [phone]. Topic: General - Inquiry >> Dec 10, 2021  9:40 AM Erskine Squibb wrote: Reason for CRM: Spouse of the patient called in very upset as there has been a series of miscommunications ending up with patient in the ER at [facility] on 12/09/2021. This all started when the patient came in after getting bit by a dog. He received a tetanus shot with dtp but then he got a bad reaction including a sinus infection and fever culminating in a trip to the ER.. She just wanted this in his record because she feels this should have never ended up with him being so bad off. Brendas call back number is [phone]

## 2021-12-16 DIAGNOSIS — M25511 Pain in right shoulder: Secondary | ICD-10-CM | POA: Diagnosis not present

## 2021-12-17 ENCOUNTER — Ambulatory Visit (INDEPENDENT_AMBULATORY_CARE_PROVIDER_SITE_OTHER): Payer: Medicare HMO | Admitting: Internal Medicine

## 2021-12-17 ENCOUNTER — Encounter: Payer: Self-pay | Admitting: Internal Medicine

## 2021-12-17 VITALS — BP 130/82 | HR 68 | Temp 97.8°F | Ht 73.0 in | Wt 209.0 lb

## 2021-12-17 DIAGNOSIS — J189 Pneumonia, unspecified organism: Secondary | ICD-10-CM

## 2021-12-17 DIAGNOSIS — R918 Other nonspecific abnormal finding of lung field: Secondary | ICD-10-CM

## 2021-12-17 DIAGNOSIS — M62830 Muscle spasm of back: Secondary | ICD-10-CM | POA: Diagnosis not present

## 2021-12-20 DIAGNOSIS — M75121 Complete rotator cuff tear or rupture of right shoulder, not specified as traumatic: Secondary | ICD-10-CM | POA: Diagnosis not present

## 2021-12-22 ENCOUNTER — Ambulatory Visit: Payer: Medicare HMO

## 2022-02-13 ENCOUNTER — Encounter: Payer: Self-pay | Admitting: Internal Medicine

## 2022-02-13 ENCOUNTER — Other Ambulatory Visit: Payer: Self-pay | Admitting: Internal Medicine

## 2022-02-13 DIAGNOSIS — N138 Other obstructive and reflux uropathy: Secondary | ICD-10-CM

## 2022-02-13 MED ORDER — TAMSULOSIN HCL 0.4 MG PO CAPS: 0.4000 mg | ORAL_CAPSULE | Freq: Every day | ORAL | 3 refills | Status: DC

## 2022-02-21 ENCOUNTER — Telehealth: Payer: Self-pay | Admitting: Internal Medicine

## 2022-02-21 NOTE — Telephone Encounter (Signed)
Copied from [place] [phone]. Topic: Medicare AWV >> Feb 21, 2022  1:30 PM Jae Dire wrote: Reason for CRM: Left message for patient to call back and schedule Medicare Annual Wellness Visit (AWV) in office.   If unable to come into the office for AWV,  please offer to do virtually or by telephone.  Last AWV: 03/01/2021  Please schedule at anytime with [facility] Health Advisor.      30 minute appointment for Virtual or phone 45 minute appointment for in office or Initial virtual/phone  Any questions, please call me at [phone]

## 2022-03-08 NOTE — Progress Notes (Unsigned)
Subjective:   Jeffrey Stokes is a 61 y.o. male who presents for Medicare Annual/Subsequent preventive examination.  I connected with  Jeffrey Stokes on 03/09/22 by a in person visit and verified that I am speaking with the correct person using two identifiers.  Patient Location: Other:  In Office  Provider Location: Office/Clinic   Review of Systems    Defer to PCP Cardiac Risk Factors include: male gender     Objective:    Today's Vitals   03/09/22 0815 03/09/22 0817  BP: 128/64   Pulse: 66   SpO2: 94%   Weight: 205 lb (93 kg)   Height: '6\' 1"'$  (1.854 m)   PainSc: 0-No pain 0-No pain   Body mass index is 27.05 kg/m.     03/09/2022    8:21 AM 03/01/2021    8:59 AM 02/19/2021    9:37 AM 02/03/2021   10:36 AM 07/02/2016    7:50 AM 07/02/2016    2:57 AM 08/25/2015    9:46 AM  Advanced Directives  Does Patient Have a Medical Advance Directive? No;Yes No No No No No No  Type of Advance Directive Living will        Would patient like information on creating a medical advance directive? No - Patient declined No - Patient declined No - Patient declined   No - Patient declined     Current Medications (verified) Outpatient Encounter Medications as of 03/09/2022  Medication Sig   albuterol (VENTOLIN HFA) 108 (90 Base) MCG/ACT inhaler Inhale 2 puffs into the lungs every 6 (six) hours as needed for wheezing or shortness of breath.   aspirin EC 325 MG EC tablet Take 1 tablet (325 mg total) by mouth daily. (Patient taking differently: Take 81 mg by mouth daily.)   clotrimazole-betamethasone (LOTRISONE) cream Apply 1 application. topically daily. To shaft of penis and under the scrotum   Doxylamine-DM (VICKS DAYQUIL/NYQUIL COUGH PO) Take 2 tablets by mouth daily as needed (cough).   Magnesium 300 MG CAPS Take by mouth.   Multiple Vitamins-Minerals (MULTIVITAMIN ADULT PO) Take 1 tablet by mouth daily. Reported on 09/23/2015   omeprazole (PRILOSEC) 20 MG capsule Take 20 mg by mouth daily.    potassium chloride (KLOR-CON) 20 MEQ packet Take 20 mEq by mouth daily.   tamsulosin (FLOMAX) 0.4 MG CAPS capsule Take 1 capsule (0.4 mg total) by mouth at bedtime.   zinc gluconate 50 MG tablet Take 50 mg by mouth daily.   No facility-administered encounter medications on file as of 03/09/2022.    Allergies (verified) Hydrocodone and Lyrica [pregabalin]   History: Past Medical History:  Diagnosis Date   Allergy    Anxiety    Benign neoplasm of ascending colon    Benign neoplasm of descending colon    Benign neoplasm of transverse colon    Depression    GERD (gastroesophageal reflux disease)    History of motion sickness    laying on back   HOH (hard of hearing)    bilateral   Hx-TIA (transient ischemic attack) 2012   Sleep apnea    had one sleep study, inconclusive,needs repeat   Stroke ([place])    3 strokes last one on 2012/ mouth tilt/ weakness right hand side   Wears dentures    full upper and lower   Past Surgical History:  Procedure Laterality Date   COLONOSCOPY WITH PROPOFOL N/A 06/01/2015   Procedure: COLONOSCOPY WITH PROPOFOL;  Surgeon: [name], MD;  Location: [facility];  Service: Endoscopy;  Laterality: N/A;  ASCENDING COLON POLYP DESCENDING COLON POLYP TRANSVERSE COLON POLYP   COLONOSCOPY WITH PROPOFOL N/A 02/19/2021   Procedure: COLONOSCOPY WITH PROPOFOL;  Surgeon: [name], MD;  Location: [place] [place];  Service: Endoscopy;  Laterality: N/A;   EYE SURGERY     Age 72    POLYPECTOMY  02/19/2021   Procedure: POLYPECTOMY;  Surgeon: [name], MD;  Location: [place];  Service: Endoscopy;;   SALIVARY GLAND SURGERY     SHOULDER ARTHROSCOPY Left 03/2014   bicep and nerve problems still   TONSILLECTOMY     UMBILICAL HERNIA REPAIR N/A 08/25/2015   Procedure: HERNIA REPAIR UMBILICAL ADULT;  Surgeon: Jules Husbands, MD;  Location: [facility];  Service: General;  Laterality: N/A;   Family History  Problem Relation Age of Onset    Osteoarthritis Father    Stroke Father    Social History   Socioeconomic History   Marital status: Married    Spouse name: Hassan Rowan   Number of children: 1   Years of education: 9   Highest education level: GED or equivalent  Occupational History   Occupation: Works for [employer]    Comment: Disability  Tobacco Use   Smoking status: Former    Packs/day: 2.00    Years: 33.00    Total pack years: 66.00    Types: Cigarettes    Start date: 06/28/1971    Quit date: 12/25/2005    Years since quitting: 16.2   Smokeless tobacco: Former    Types: Nurse, children's Use: Never used  Substance and Sexual Activity   Alcohol use: No    Alcohol/week: 0.0 standard drinks of alcohol   Drug use: Never   Sexual activity: Yes    Birth control/protection: None  Other Topics Concern   Not on file  Social History Narrative   Not on file   Social Determinants of Health   Financial Resource Strain: Low Risk  (03/09/2022)   Overall Financial Resource Strain (CARDIA)    Difficulty of Paying Living Expenses: Not hard at all  Food Insecurity: No Food Insecurity (03/09/2022)   Hunger Vital Sign    Worried About Running Out of Food in the Last Year: Never true    [place] in the Last Year: Never true  Transportation Needs: No Transportation Needs (03/01/2021)   PRAPARE - Hydrologist (Medical): No    Lack of Transportation (Non-Medical): No  Physical Activity: Sufficiently Active (03/09/2022)   Exercise Vital Sign    Days of Exercise per Week: 7 days    Minutes of Exercise per Session: 90 min  Stress: No Stress Concern Present (03/09/2022)   [place]    Feeling of Stress : Only a little  Social Connections: Socially Integrated (03/09/2022)   Social Connection and Isolation Panel [NHANES]    Frequency of Communication with Friends and Family: More than three times a week     Frequency of Social Gatherings with Friends and Family: Twice a week    Attends Religious Services: More than 4 times per year    Active Member of [employer] or Organizations: Yes    Attends Archivist Meetings: Never    Marital Status: Married    Tobacco Counseling Counseling given: Not Answered   Clinical Intake:  Pre-visit preparation completed: Yes  Pain : No/denies pain Pain Score: 0-No pain  BMI - recorded: 27.05 Nutritional Status: BMI 25 -29 Overweight Nutritional Risks: None Diabetes: No     Diabetic? No.  Interpreter Needed?: No  Information entered by :: Wyatt Haste, [place] of Daily Living    03/09/2022    8:23 AM 06/30/2021   10:52 AM  In your present state of health, do you have any difficulty performing the following activities:  Hearing? 0 1  Vision? 0 1  Difficulty concentrating or making decisions? 0 0  Walking or climbing stairs? 0 0  Dressing or bathing? 0 1  Doing errands, shopping? 0 0  Preparing Food and eating ? N   Using the Toilet? N   In the past six months, have you accidently leaked urine? N   Do you have problems with loss of bowel control? N   Managing your Medications? N   Managing your Finances? N   Housekeeping or managing your Housekeeping? N     Patient Care Team: Glean Hess, MD as PCP - General (Internal Medicine) [name], MD as Consulting Physician (Gastroenterology)  Indicate any recent Medical Services you may have received from other than [facility] providers in the past year (date may be approximate).     Assessment:   This is a routine wellness examination for [place].  Hearing/Vision screen Hearing Screening - Comments:: No concerns. Vision Screening - Comments:: Wears prescription glasses.  Dietary issues and exercise activities discussed: Current Exercise Habits: Home exercise routine   Goals Addressed   None   Depression Screen    03/09/2022    8:20 AM 12/17/2021   10:59 AM  11/30/2021    2:50 PM 06/30/2021   10:51 AM 03/01/2021    9:04 AM 12/18/2020    3:10 PM 06/18/2020   10:22 AM  PHQ 2/9 Scores  PHQ - 2 Score 1 0 0 0 0 0 0  PHQ- 9 Score '1 2 4 4 '$ 0 0 0    Fall Risk    03/09/2022    8:22 AM 12/17/2021   10:59 AM 11/30/2021    2:51 PM 06/30/2021   10:52 AM 03/01/2021    9:03 AM  [place] in the past year? 0 0 1 0 0  Number falls in past yr: 0 0 1 0 0  Injury with Fall? 0 0 1 0 0  Risk for fall due to : No Fall Risks No Fall Risks No Fall Risks No Fall Risks No Fall Risks  Follow up Falls evaluation completed Falls evaluation completed Falls evaluation completed Falls evaluation completed Falls evaluation completed    FALL RISK PREVENTION PERTAINING TO THE HOME:  Any stairs in or around the home? Yes  If so, are there any without handrails? No  Home free of loose throw rugs in walkways, pet beds, electrical cords, etc? Yes  Adequate lighting in your home to reduce risk of falls? Yes   ASSISTIVE DEVICES UTILIZED TO PREVENT FALLS:  Life alert? No  Use of a cane, walker or w/c? No  Grab bars in the bathroom? Yes  Shower chair or bench in shower? Yes Elevated toilet seat or a handicapped toilet? Yes   TIMED UP AND GO:  Was the test performed? Yes .   Gait steady and fast without use of assistive device  Cognitive Function:        03/09/2022    8:23 AM 03/01/2021    9:10 AM  6CIT Screen  What Year?  0 points  What month? 0 points 0 points  What time? 0 points 0 points  Count back from 20 0 points 0 points  Months in reverse 2 points 0 points  Repeat phrase 0 points 0 points  Total Score  0 points    Immunizations Immunization History  Administered Date(s) Administered   Influenza,inj,Quad PF,6+ Mos 03/14/2017, 05/04/2018, 06/30/2021   Influenza-Unspecified 03/27/2015   Tdap 11/30/2021    TDAP status: Up to date  Flu Vaccine status: Declined, Education has been provided regarding the importance of this vaccine but patient still  declined. Advised may receive this vaccine at local pharmacy or Health Dept. Aware to provide a copy of the vaccination record if obtained from local pharmacy or Health Dept. Verbalized acceptance and understanding.  Covid-19 vaccine status: Declined, Education has been provided regarding the importance of this vaccine but patient still declined. Advised may receive this vaccine at local pharmacy or Health Dept.or vaccine clinic. Aware to provide a copy of the vaccination record if obtained from local pharmacy or Health Dept. Verbalized acceptance and understanding.  Qualifies for Shingles Vaccine? Yes   Zostavax completed No   Shingrix Completed?: No.    Education has been provided regarding the importance of this vaccine. Patient has been advised to call insurance company to determine out of pocket expense if they have not yet received this vaccine. Advised may also receive vaccine at local pharmacy or Health Dept. Verbalized acceptance and understanding.  Screening Tests Health Maintenance  Topic Date Due   Zoster Vaccines- Shingrix (1 of 2) Never done   INFLUENZA VACCINE  09/25/2022 (Originally 01/25/2022)   COLONOSCOPY (Pts 45-34yr Insurance coverage will need to be confirmed)  02/20/2028   TETANUS/TDAP  12/01/2031   Hepatitis C Screening  Addressed   HIV Screening  Addressed   Pneumococcal Vaccine 12162Years old  Aged Out   HPV VACCINES  Aged Out   COVID-19 Vaccine  Discontinued    Health Maintenance  Health Maintenance Due  Topic Date Due   Zoster Vaccines- Shingrix (1 of 2) Never done    Colorectal cancer screening: Type of screening: Colonoscopy. Completed 01/30/2021. Repeat every 7 years  Lung Cancer Screening: (Low Dose CT Chest recommended if Age 59-80years, 30 pack-year currently smoking OR have quit w/in 15years.) does qualify.   Lung Cancer Screening Referral: Patient already has CT Angio- Multiple Nodules Found- Dr BFaylene Kurtzassitional CT of Chest for patient.  Not scheduled yet.  Additional Screening:  Hepatitis C Screening: does qualify; Completed 12/10/1999  Vision Screening: Recommended annual ophthalmology exams for early detection of glaucoma and other disorders of the eye. Is the patient up to date with their annual eye exam?  No Who is the provider or what is the name of the office in which the patient attends annual eye exams? N/A If pt is not established with a provider, would they like to be referred to a provider to establish care?  Pt declined. .   Dental Screening: Recommended annual dental exams for proper oral hygiene  Community Resource Referral / Chronic Care Management: CRR required this visit?  No   CCM required this visit?  No      Plan:     I have personally reviewed and noted the following in the patient's chart:   Medical and social history Use of alcohol, tobacco or illicit drugs  Current medications and supplements including opioid prescriptions. Patient is not currently taking opioid prescriptions. Functional ability and status Nutritional status Physical activity Advanced  directives List of other physicians Hospitalizations, surgeries, and ER visits in previous 12 months Vitals Screenings to include cognitive, depression, and falls Referrals and appointments  In addition, I have reviewed and discussed with patient certain preventive protocols, quality metrics, and best practice recommendations. A written personalized care plan for preventive services as well as general preventive health recommendations were provided to patient.     Clista Bernhardt, [place]   03/09/2022   Nurse Notes: Patient had a CT of chest ordered in June. Has not been scheduled for this yet.

## 2022-03-09 ENCOUNTER — Ambulatory Visit (INDEPENDENT_AMBULATORY_CARE_PROVIDER_SITE_OTHER): Payer: Medicare HMO

## 2022-03-09 ENCOUNTER — Telehealth: Payer: Self-pay | Admitting: Internal Medicine

## 2022-03-09 VITALS — BP 128/64 | HR 66 | Ht 73.0 in | Wt 205.0 lb

## 2022-03-09 DIAGNOSIS — Z0000 Encounter for general adult medical examination without abnormal findings: Secondary | ICD-10-CM | POA: Diagnosis not present

## 2022-03-09 NOTE — Telephone Encounter (Signed)
Reached out to [facility] waiting on a response. Asked her to extend the authorization.  KP

## 2022-03-09 NOTE — Telephone Encounter (Signed)
Copied from [place] [id number]. Topic: General - Inquiry >> Mar 09, 2022 12:44 PM Marcellus Scott wrote: Reason for CRM: Dawn from the [facility] pre-service center stated pt was just added to the schedule for a Chest CT for tomorrow. Dawn is asking if there is any authorization information available from insurance.  Dawn requesting a call back.   Please advise.

## 2022-03-10 ENCOUNTER — Ambulatory Visit: Payer: Medicare HMO

## 2022-03-10 NOTE — Telephone Encounter (Signed)
It has been authorized and rescheduled.  KP

## 2022-03-11 ENCOUNTER — Other Ambulatory Visit (HOSPITAL_COMMUNITY): Payer: Medicare HMO

## 2022-03-11 NOTE — Telephone Encounter (Signed)
Tried [employer] back. Left VM asking her to call the office back for [place]. CT APPROVED. CODE [id number].

## 2022-03-11 NOTE — Telephone Encounter (Signed)
Dawn w/ [facility] pre-services is checking status of authorization information for pts insurance   She is requesting a cb  Phone  [phone] Ext [phone]

## 2022-03-14 ENCOUNTER — Encounter: Payer: Self-pay | Admitting: Internal Medicine

## 2022-03-15 ENCOUNTER — Ambulatory Visit: Payer: Medicare HMO

## 2022-03-16 ENCOUNTER — Ambulatory Visit
Admission: RE | Admit: 2022-03-16 | Discharge: 2022-03-16 | Disposition: A | Discharge status: Home / Self Care | Payer: Medicare HMO | Source: Ambulatory Visit | Attending: Internal Medicine | Admitting: Internal Medicine

## 2022-03-16 DIAGNOSIS — R911 Solitary pulmonary nodule: Secondary | ICD-10-CM | POA: Diagnosis not present

## 2022-03-16 DIAGNOSIS — J439 Emphysema, unspecified: Secondary | ICD-10-CM | POA: Diagnosis not present

## 2022-03-16 DIAGNOSIS — R918 Other nonspecific abnormal finding of lung field: Secondary | ICD-10-CM | POA: Diagnosis not present

## 2022-04-06 ENCOUNTER — Encounter: Payer: Self-pay | Admitting: Internal Medicine

## 2022-04-06 DIAGNOSIS — M75121 Complete rotator cuff tear or rupture of right shoulder, not specified as traumatic: Secondary | ICD-10-CM | POA: Diagnosis not present

## 2022-04-07 ENCOUNTER — Encounter: Payer: Self-pay | Admitting: Internal Medicine

## 2022-04-08 ENCOUNTER — Ambulatory Visit (INDEPENDENT_AMBULATORY_CARE_PROVIDER_SITE_OTHER): Payer: Medicare HMO | Admitting: Internal Medicine

## 2022-04-08 ENCOUNTER — Encounter: Payer: Self-pay | Admitting: Internal Medicine

## 2022-04-08 VITALS — BP 128/72 | HR 58 | Ht 73.0 in | Wt 208.0 lb

## 2022-04-08 DIAGNOSIS — G5642 Causalgia of left upper limb: Secondary | ICD-10-CM

## 2022-04-08 DIAGNOSIS — I6523 Occlusion and stenosis of bilateral carotid arteries: Secondary | ICD-10-CM

## 2022-04-08 DIAGNOSIS — N401 Enlarged prostate with lower urinary tract symptoms: Secondary | ICD-10-CM

## 2022-04-08 DIAGNOSIS — N138 Other obstructive and reflux uropathy: Secondary | ICD-10-CM | POA: Diagnosis not present

## 2022-04-08 DIAGNOSIS — R739 Hyperglycemia, unspecified: Secondary | ICD-10-CM | POA: Insufficient documentation

## 2022-04-08 NOTE — Progress Notes (Signed)
Date:  04/08/2022   Name:  Jeffrey Stokes   DOB:  1960/07/06   MRN:  [id number]   Chief Complaint: No chief complaint on file.  Shoulder Pain  The pain is present in the left shoulder. This is a chronic problem. The problem occurs constantly. The problem has been gradually worsening. The pain is moderate. Associated symptoms include an inability to bear weight and a limited range of motion. Pertinent negatives include no fever. Treatments tried: planning surgery for reverse shoulder in the near future and needs clearance.    Lab Results  Component Value Date   NA 141 12/09/2021   K 4.6 12/09/2021   CO2 25 12/09/2021   GLUCOSE 130 (H) 12/09/2021   BUN 13 12/09/2021   CREATININE 1.11 12/09/2021   CALCIUM 9.8 12/09/2021   EGFR 82 06/30/2021   GFRNONAA >60 12/09/2021   Lab Results  Component Value Date   CHOL 202 (H) 06/30/2021   HDL 47 06/30/2021   LDLCALC 134 (H) 06/30/2021   TRIG 114 06/30/2021   CHOLHDL 4.3 06/30/2021   Lab Results  Component Value Date   TSH 1.440 10/26/2015   No results found for: "HGBA1C" Lab Results  Component Value Date   WBC 12.6 (H) 12/09/2021   HGB 15.2 12/09/2021   HCT 42.7 12/09/2021   MCV 95.1 12/09/2021   PLT 319 12/09/2021   Lab Results  Component Value Date   ALT 36 12/09/2021   AST 24 12/09/2021   ALKPHOS 78 12/09/2021   BILITOT 0.5 12/09/2021   No results found for: "25OHVITD2", "25OHVITD3", "VD25OH"   Review of Systems  Constitutional:  Negative for chills, fatigue and fever.  Respiratory:  Negative for cough, chest tightness, shortness of breath and wheezing.   Cardiovascular:  Negative for chest pain and palpitations.  Gastrointestinal:  Negative for abdominal pain and constipation.  Neurological:  Negative for dizziness and headaches.  Psychiatric/Behavioral:  Negative for dysphoric mood and sleep disturbance. The patient is not nervous/anxious.    CT Chest 03/18/22: IMPRESSION: 1. Interval resolution of previously  described left lower lobe pulmonary nodules compatible with resolved infectious/inflammatory process. 2. 4 mm left solid pulmonary nodule. Per Fleischner Society Guidelines, no routine follow-up imaging is recommended. These guidelines do not apply to immunocompromised patients and patients with cancer. Follow up in patients with significant comorbidities as clinically warranted. For lung cancer screening, adhere to Lung-RADS guidelines. Reference: Radiology. 2017; 284(1):228-43. Patient Active Problem List   Diagnosis Date Noted   Pulmonary nodules/lesions, multiple 12/17/2021   Mixed hyperlipidemia 06/30/2021   BPH with obstruction/lower urinary tract symptoms 06/30/2021   Bilateral carotid artery disease, unspecified type (HCC) 06/30/2021   History of colonic polyps    Polyp of transverse colon    Gastroesophageal reflux disease 03/14/2017   Ulnar neuritis, left 04/20/2016   Complex regional pain syndrome type 2 of left upper extremity 02/12/2016   Rhinitis, allergic 07/29/2015   LPRD (laryngopharyngeal reflux disease) 03/31/2015   Panic disorder without agoraphobia 03/31/2015   History of stroke 12/03/2014   HSV-2 infection 12/03/2014   Obstructive apnea 12/03/2014   Disorder of rotator cuff 12/03/2014   Mild atherosclerosis of carotid artery, bilateral     Allergies  Allergen Reactions   Hydrocodone Nausea Only and Anxiety   Lyrica [Pregabalin] Other (See Comments)    "drunk feeling"    Past Surgical History:  Procedure Laterality Date   COLONOSCOPY WITH PROPOFOL N/A 06/01/2015   Procedure: COLONOSCOPY WITH PROPOFOL;  Surgeon: Midge Minium,  MD;  Location: [place];  Service: Endoscopy;  Laterality: N/A;  ASCENDING COLON POLYP DESCENDING COLON POLYP TRANSVERSE COLON POLYP   COLONOSCOPY WITH PROPOFOL N/A 02/19/2021   Procedure: COLONOSCOPY WITH PROPOFOL;  Surgeon: [name], MD;  Location: [place];  Service: Endoscopy;  Laterality: N/A;   EYE  SURGERY     Age 61    POLYPECTOMY  02/19/2021   Procedure: POLYPECTOMY;  Surgeon: [name], MD;  Location: [place];  Service: Endoscopy;;   SALIVARY GLAND SURGERY     SHOULDER ARTHROSCOPY Left 03/2014   bicep and nerve problems still   TONSILLECTOMY     UMBILICAL HERNIA REPAIR N/A 08/25/2015   Procedure: HERNIA REPAIR UMBILICAL ADULT;  Surgeon: Jules Husbands, MD;  Location: [facility];  Service: General;  Laterality: N/A;    Social History   Tobacco Use   Smoking status: Former    Packs/day: 2.00    Years: 33.00    Total pack years: 66.00    Types: Cigarettes    Start date: 06/28/1971    Quit date: 12/25/2005    Years since quitting: 16.2   Smokeless tobacco: Former    Types: Nurse, children's Use: Never used  Substance Use Topics   Alcohol use: No    Alcohol/week: 0.0 standard drinks of alcohol   Drug use: Never     Medication list has been reviewed and updated.  Current Meds  Medication Sig   albuterol (VENTOLIN HFA) 108 (90 Base) MCG/ACT inhaler Inhale 2 puffs into the lungs every 6 (six) hours as needed for wheezing or shortness of breath.   aspirin EC 325 MG EC tablet Take 1 tablet (325 mg total) by mouth daily. (Patient taking differently: Take 81 mg by mouth daily.)   clotrimazole-betamethasone (LOTRISONE) cream Apply 1 application. topically daily. To shaft of penis and under the scrotum   Doxylamine-DM (VICKS DAYQUIL/NYQUIL COUGH PO) Take 2 tablets by mouth daily as needed (cough).   Magnesium 300 MG CAPS Take by mouth.   Multiple Vitamins-Minerals (MULTIVITAMIN ADULT PO) Take 1 tablet by mouth daily. Reported on 09/23/2015   omeprazole (PRILOSEC) 20 MG capsule Take 20 mg by mouth daily.   potassium chloride (KLOR-CON) 20 MEQ packet Take 20 mEq by mouth daily.   tamsulosin (FLOMAX) 0.4 MG CAPS capsule Take 1 capsule (0.4 mg total) by mouth at bedtime.   zinc gluconate 50 MG tablet Take 50 mg by mouth daily.       04/08/2022   11:49 AM  12/17/2021   10:59 AM 11/30/2021    2:51 PM 06/30/2021   10:52 AM  GAD 7 : Generalized Anxiety Score  Nervous, Anxious, on Edge 0 0 2 3  Control/stop worrying 0 0 1 1  Worry too much - different things 3 0 1 0  Trouble relaxing 0 0 0 0  Restless 0 0 0 0  Easily annoyed or irritable 0 0 1 0  Afraid - awful might happen 0 0 0 0  Total GAD 7 Score 3 0 5 4  Anxiety Difficulty Not difficult at all Not difficult at all Not difficult at all Not difficult at all       04/08/2022   11:48 AM 03/09/2022    8:20 AM 12/17/2021   10:59 AM  Depression screen PHQ 2/9  Decreased Interest 0 0 0  Down, Depressed, Hopeless 0 1 0  PHQ - 2 Score 0 1 0  Altered sleeping 0 0  2  Tired, decreased energy 0 0 0  Change in appetite 0 0 0  Feeling bad or failure about yourself  0 0 0  Trouble concentrating 0 0 0  Moving slowly or fidgety/restless 0 0 0  Suicidal thoughts 0 0 0  PHQ-9 Score 0 1 2  Difficult doing work/chores Not difficult at all Not difficult at all Not difficult at all    BP Readings from Last 3 Encounters:  04/08/22 128/72  03/09/22 128/64  12/17/21 130/82    Physical Exam Constitutional:      Appearance: Normal appearance.  Cardiovascular:     Rate and Rhythm: Normal rate and regular rhythm.     Pulses: Normal pulses.     Heart sounds: No murmur heard. Pulmonary:     Effort: Pulmonary effort is normal. No respiratory distress.     Breath sounds: No stridor. No wheezing.  Abdominal:     General: Abdomen is flat.     Palpations: Abdomen is soft.  Musculoskeletal:     Cervical back: Normal range of motion.     Right lower leg: No edema.     Left lower leg: No edema.  Lymphadenopathy:     Cervical: No cervical adenopathy.  Skin:    General: Skin is warm and dry.  Neurological:     General: No focal deficit present.     Mental Status: He is alert.  Psychiatric:        Mood and Affect: Mood normal.        Behavior: Behavior normal.     Wt Readings from Last 3  Encounters:  04/08/22 208 lb (94.3 kg)  03/09/22 205 lb (93 kg)  12/17/21 209 lb (94.8 kg)    BP 128/72   Pulse (!) 58   Ht $R'6\' 1"'xy$  (1.854 m)   Wt 208 lb (94.3 kg)   SpO2 98%   BMI 27.44 kg/m   Assessment and Plan: 1. Complex regional pain syndrome type 2 of left upper extremity He is planning surgery - Reverse total shoulder. His recent pneumonia has cleared and the abnormal findings on the CT of the chest have resolved. He is low risk for complications and is cleared to surgery without further testing.  2. Mild atherosclerosis of carotid artery, bilateral Normal exam without bruit. 2018 [place] - Minor carotid atherosclerosis. No hemodynamically significant ICA stenosis. Degree of narrowing less than 50% bilaterally.   3. BPH with obstruction/lower urinary tract symptoms On Flomax with good control of symptoms.   Partially dictated using Editor, commissioning. Any errors are unintentional.  Halina Maidens, MD [facility]  04/08/2022

## 2022-05-10 ENCOUNTER — Other Ambulatory Visit: Payer: Self-pay | Admitting: Internal Medicine

## 2022-05-10 DIAGNOSIS — N138 Other obstructive and reflux uropathy: Secondary | ICD-10-CM

## 2022-05-10 NOTE — Telephone Encounter (Signed)
Refilled 02/13/2022 #30 3 rf. Requested Prescriptions  Pending Prescriptions Disp Refills   tamsulosin (FLOMAX) 0.4 MG CAPS capsule [Pharmacy Med Name: Tamsulosin HCl 0.4 MG Oral Capsule] 90 capsule 0    Sig: Take 1 capsule by mouth at bedtime     Urology: Alpha-Adrenergic Blocker Passed - 05/10/2022  9:16 AM      Passed - PSA in normal range and within 360 days    Prostate Specific Ag, Serum  Date Value Ref Range Status  11/30/2021 0.8 0.0 - 4.0 ng/mL Final    Comment:    Roche ECLIA methodology. According to the American Urological Association, Serum PSA should decrease and remain at undetectable levels after radical prostatectomy. The AUA defines biochemical recurrence as an initial PSA value 0.2 ng/mL or greater followed by a subsequent confirmatory PSA value 0.2 ng/mL or greater. Values obtained with different assay methods or kits cannot be used interchangeably. Results cannot be interpreted as absolute evidence of the presence or absence of malignant disease.          Passed - Last BP in normal range    BP Readings from Last 1 Encounters:  04/08/22 128/72         Passed - Valid encounter within last 12 months    Recent Outpatient Visits           1 month ago Complex regional pain syndrome type 2 of left upper extremity   [place] [facility] at [facility], Jesse Sans, MD   4 months ago Community acquired pneumonia of right middle lobe of lung   [place] [facility] at [facility], Jesse Sans, MD   5 months ago BPH with obstruction/lower urinary tract symptoms   [place] [facility] at [facility], Jesse Sans, MD   8 months ago Acute non-recurrent maxillary sinusitis   [place] [facility] at [facility], Jesse Sans, MD   10 months ago Annual physical exam   [facility] at [facility], Jesse Sans, MD       Future Appointments             In 1 month Army Melia, Jesse Sans, MD [place] [facility] at [facility]

## 2022-05-13 ENCOUNTER — Other Ambulatory Visit: Payer: Self-pay | Admitting: Orthopedic Surgery

## 2022-05-24 NOTE — Patient Instructions (Signed)
DUE TO COVID-19 ONLY TWO VISITORS  (aged 59 and older)  ARE ALLOWED TO COME WITH YOU AND STAY IN THE WAITING ROOM ONLY DURING PRE OP AND PROCEDURE.   **NO VISITORS ARE ALLOWED IN THE SHORT STAY AREA OR RECOVERY ROOM!!**  IF YOU WILL BE ADMITTED INTO THE HOSPITAL YOU ARE ALLOWED ONLY FOUR SUPPORT PEOPLE DURING VISITATION HOURS ONLY (7 AM -8PM)   The support person(s) must pass our screening, gel in and out, and wear a mask at all times, including in the patient's room. Patients must also wear a mask when staff or their support person are in the room. Visitors GUEST BADGE MUST BE WORN VISIBLY  One adult visitor may remain with you overnight and MUST be in the room by 8 P.M.     Your procedure is scheduled on: 05/26/22   Report to [facility] Main Entrance    Report to admitting at : 5:15 AM   Call this number if you have problems the morning of surgery [phone]   Do not eat food :After Midnight.   After Midnight you may have the following liquids until : 4:30 AM DAY OF SURGERY  Water Black Coffee (sugar ok, NO MILK/CREAM OR CREAMERS)  Tea (sugar ok, NO MILK/CREAM OR CREAMERS) regular and decaf                             Plain Jell-O (NO RED)                                           Fruit ices (not with fruit pulp, NO RED)                                     Popsicles (NO RED)                                                                  Juice: apple, WHITE grape, WHITE cranberry Sports drinks like Gatorade (NO RED)                 The day of surgery:  Drink ONE (1) Pre-Surgery Clear Ensure or G2 at: 4:30 AM the morning of surgery. Drink in one sitting. Do not sip.  This drink was given to you during your hospital  pre-op appointment visit. Nothing else to drink after completing the  Pre-Surgery Clear Ensure or G2.          If you have questions, please contact your surgeon's office.    Oral Hygiene is also important to reduce your risk of infection.                                     Remember - BRUSH YOUR TEETH THE MORNING OF SURGERY WITH YOUR REGULAR TOOTHPASTE   Do NOT smoke after Midnight   Take these medicines the morning of surgery with A SIP OF WATER:omeprazole.Use inhalers as usual.   DO NOT TAKE  ANY ORAL DIABETIC MEDICATIONS DAY OF YOUR SURGERY  Bring CPAP mask and tubing day of surgery.                              You may not have any metal on your body including hair pins, jewelry, and body piercing             Do not wear lotions, powders, perfumes/cologne, or deodorant              Men may shave face and neck.   Do not bring valuables to the hospital. [place].   Contacts, dentures or bridgework may not be worn into surgery.   Bring small overnight bag day of surgery.   DO NOT [place]. PHARMACY WILL DISPENSE MEDICATIONS LISTED ON YOUR MEDICATION LIST TO YOU DURING YOUR ADMISSION [place]!    Patients discharged on the day of surgery will not be allowed to drive home.  Someone NEEDS to stay with you for the first 24 hours after anesthesia.   Special Instructions: Bring a copy of your healthcare power of attorney and living will documents         the day of surgery if you haven't scanned them before.              Please read over the following fact sheets you were given: IF YOU HAVE QUESTIONS ABOUT YOUR PRE-OP INSTRUCTIONS PLEASE CALL [phone]    [facility] - Preparing for Surgery Before surgery, you can play an important role.  Because skin is not sterile, your skin needs to be as free of germs as possible.  You can reduce the number of germs on your skin by washing with CHG (chlorahexidine gluconate) soap before surgery.  CHG is an antiseptic cleaner which kills germs and bonds with the skin to continue killing germs even after washing. Please DO NOT use if you have an allergy to CHG or antibacterial soaps.  If your skin becomes  reddened/irritated stop using the CHG and inform your nurse when you arrive at Short Stay. Do not shave (including legs and underarms) for at least 48 hours prior to the first CHG shower.  You may shave your face/neck. Please follow these instructions carefully:  1.  Shower with CHG Soap the night before surgery and the  morning of Surgery.  2.  If you choose to wash your hair, wash your hair first as usual with your  normal  shampoo.  3.  After you shampoo, rinse your hair and body thoroughly to remove the  shampoo.                           4.  Use CHG as you would any other liquid soap.  You can apply chg directly  to the skin and wash                       Gently with a scrungie or clean washcloth.  5.  Apply the CHG Soap to your body ONLY FROM THE NECK DOWN.   Do not use on face/ open  Wound or open sores. Avoid contact with eyes, ears mouth and genitals (private parts).                       Wash face,  Genitals (private parts) with your normal soap.             6.  Wash thoroughly, paying special attention to the area where your surgery  will be performed.  7.  Thoroughly rinse your body with warm water from the neck down.  8.  DO NOT shower/wash with your normal soap after using and rinsing off  the CHG Soap.                9.  Pat yourself dry with a clean towel.            10.  Wear clean pajamas.            11.  Place clean sheets on your bed the night of your first shower and do not  sleep with pets. Day of Surgery : Do not apply any lotions/deodorants the morning of surgery.  Please wear clean clothes to the hospital/surgery center.  FAILURE TO FOLLOW THESE INSTRUCTIONS MAY RESULT IN THE CANCELLATION OF YOUR SURGERY PATIENT SIGNATURE_________________________________  NURSE SIGNATURE__________________________________  ________________________________________________________________________[facility]- Preparing for Total Shoulder Arthroplasty    Before  surgery, you can play an important role. Because skin is not sterile, your skin needs to be as free of germs as possible. You can reduce the number of germs on your skin by using the following products. Benzoyl Peroxide Gel Reduces the number of germs present on the skin Applied twice a day to shoulder area starting two days before surgery    ==================================================================  Please follow these instructions carefully:  BENZOYL PEROXIDE 5% GEL  Please do not use if you have an allergy to benzoyl peroxide.   If your skin becomes reddened/irritated stop using the benzoyl peroxide.  Starting two days before surgery, apply as follows: Apply benzoyl peroxide in the morning and at night. Apply after taking a shower. If you are not taking a shower clean entire shoulder front, back, and side along with the armpit with a clean wet washcloth.  Place a quarter-sized dollop on your shoulder and rub in thoroughly, making sure to cover the front, back, and side of your shoulder, along with the armpit.   2 days before ____ AM   ____ PM              1 day before ____ AM   ____ PM                         Do this twice a day for two days.  (Last application is the night before surgery, AFTER using the CHG soap as described below).  Do NOT apply benzoyl peroxide gel on the day of surgery.  Incentive Spirometer  An incentive spirometer is a tool that can help keep your lungs clear and active. This tool measures how well you are filling your lungs with each breath. Taking long deep breaths may help reverse or decrease the chance of developing breathing (pulmonary) problems (especially infection) following: A long period of time when you are unable to move or be active. BEFORE THE PROCEDURE  If the spirometer includes an indicator to show your best effort, your nurse or respiratory therapist will set it to a desired goal. If possible, sit up straight  or lean slightly  forward. Try not to slouch. Hold the incentive spirometer in an upright position. INSTRUCTIONS FOR USE  Sit on the edge of your bed if possible, or sit up as far as you can in bed or on a chair. Hold the incentive spirometer in an upright position. Breathe out normally. Place the mouthpiece in your mouth and seal your lips tightly around it. Breathe in slowly and as deeply as possible, raising the piston or the ball toward the top of the column. Hold your breath for 3-5 seconds or for as long as possible. Allow the piston or ball to fall to the bottom of the column. Remove the mouthpiece from your mouth and breathe out normally. Rest for a few seconds and repeat Steps 1 through 7 at least 10 times every 1-2 hours when you are awake. Take your time and take a few normal breaths between deep breaths. The spirometer may include an indicator to show your best effort. Use the indicator as a goal to work toward during each repetition. After each set of 10 deep breaths, practice coughing to be sure your lungs are clear. If you have an incision (the cut made at the time of surgery), support your incision when coughing by placing a pillow or rolled up towels firmly against it. Once you are able to get out of bed, walk around indoors and cough well. You may stop using the incentive spirometer when instructed by your caregiver.  RISKS AND COMPLICATIONS Take your time so you do not get dizzy or light-headed. If you are in pain, you may need to take or ask for pain medication before doing incentive spirometry. It is harder to take a deep breath if you are having pain. AFTER USE Rest and breathe slowly and easily. It can be helpful to keep track of a log of your progress. Your caregiver can provide you with a simple table to help with this. If you are using the spirometer at home, follow these instructions: [place] IF:  You are having difficultly using the spirometer. You have trouble using the  spirometer as often as instructed. Your pain medication is not giving enough relief while using the spirometer. You develop fever of 100.5 F (38.1 C) or higher. SEEK IMMEDIATE MEDICAL CARE IF:  You cough up bloody sputum that had not been present before. You develop fever of 102 F (38.9 C) or greater. You develop worsening pain at or near the incision site. MAKE SURE YOU:  Understand these instructions. Will watch your condition. Will get help right away if you are not doing well or get worse. Document Released: 10/24/2006 Document Revised: 09/05/2011 Document Reviewed: 12/25/2006 [facility] Patient Information 2014 [place], [place].   ________________________________________________________________________

## 2022-05-25 ENCOUNTER — Other Ambulatory Visit: Payer: Self-pay

## 2022-05-25 ENCOUNTER — Encounter (HOSPITAL_COMMUNITY)
Admission: RE | Admit: 2022-05-25 | Discharge: 2022-05-25 | Disposition: A | Discharge status: Home / Self Care | Payer: Medicare HMO | Source: Ambulatory Visit | Attending: Orthopedic Surgery | Admitting: Orthopedic Surgery

## 2022-05-25 ENCOUNTER — Ambulatory Visit (HOSPITAL_COMMUNITY)
Admission: RE | Admit: 2022-05-25 | Discharge: 2022-05-25 | Disposition: A | Discharge status: Home / Self Care | Payer: Medicare HMO | Source: Ambulatory Visit | Attending: Orthopedic Surgery | Admitting: Orthopedic Surgery

## 2022-05-25 ENCOUNTER — Encounter (HOSPITAL_COMMUNITY): Payer: Self-pay

## 2022-05-25 VITALS — BP 112/67 | HR 58 | Temp 97.5°F | Ht 72.0 in | Wt 206.0 lb

## 2022-05-25 DIAGNOSIS — R739 Hyperglycemia, unspecified: Secondary | ICD-10-CM | POA: Diagnosis not present

## 2022-05-25 DIAGNOSIS — Z8673 Personal history of transient ischemic attack (TIA), and cerebral infarction without residual deficits: Secondary | ICD-10-CM | POA: Insufficient documentation

## 2022-05-25 DIAGNOSIS — Z01818 Encounter for other preprocedural examination: Secondary | ICD-10-CM | POA: Insufficient documentation

## 2022-05-25 DIAGNOSIS — E782 Mixed hyperlipidemia: Secondary | ICD-10-CM

## 2022-05-25 LAB — CBC
HCT: 44.7 % (ref 39.0–52.0)
Hemoglobin: 15.5 g/dL (ref 13.0–17.0)
MCH: 33.7 pg (ref 26.0–34.0)
MCHC: 34.7 g/dL (ref 30.0–36.0)
MCV: 97.2 fL (ref 80.0–100.0)
Platelets: 219 10*3/uL (ref 150–400)
RBC: 4.6 MIL/uL (ref 4.22–5.81)
RDW: 13.1 % (ref 11.5–15.5)
WBC: 8.3 10*3/uL (ref 4.0–10.5)
nRBC: 0 % (ref 0.0–0.2)

## 2022-05-25 LAB — BASIC METABOLIC PANEL
Anion gap: 5 (ref 5–15)
BUN: 12 mg/dL (ref 6–20)
CO2: 28 mmol/L (ref 22–32)
Calcium: 9.3 mg/dL (ref 8.9–10.3)
Chloride: 107 mmol/L (ref 98–111)
Creatinine, Ser: 0.95 mg/dL (ref 0.61–1.24)
GFR, Estimated: >60 mL/min (ref >60)
Glucose, Bld: 106 mg/dL — ABNORMAL HIGH (ref 70–99)
Potassium: 4.2 mmol/L (ref 3.5–5.1)
Sodium: 140 mmol/L (ref 135–145)

## 2022-05-25 LAB — SURGICAL PCR SCREEN
MRSA, PCR: NEGATIVE
Staphylococcus aureus: NEGATIVE

## 2022-05-25 NOTE — Progress Notes (Addendum)
For Short Stay: [place] appointment date:  Bowel Prep reminder:   For Anesthesia: PCP - Dr. Halina Maidens Cardiologist - N/A  Chest x-ray - CT chest: 03/18/22. EKG - 12/09/21 Stress Test -  ECHO - 04/17/14 Cardiac Cath -  Pacemaker/ICD device last checked: Pacemaker orders received: Device Rep notified:  Spinal Cord Stimulator:  Sleep Study - Yes CPAP - NO  Fasting Blood Sugar -  Checks Blood Sugar _____ times a day Date and result of last Hgb A1c-  Last dose of GLP1 agonist-  GLP1 instructions:   Last dose of SGLT-2 inhibitors-  SGLT-2 instructions:   Blood Thinner Instructions: Aspirin Instructions: On hold since 05/24/22 Last Dose:  Activity level: Can go up a flight of stairs and activities of daily living without stopping and without chest pain and/or shortness of breath   Able to exercise without chest pain and/or shortness of breath   Unable to go up a flight of stairs without chest pain and/or shortness of breath     Anesthesia review:Hx: TIA's,OSA(NO CPAP),Stroke   Patient denies shortness of breath, fever, cough and chest pain at PAT appointment   Patient verbalized understanding of instructions that were given to them at the PAT appointment. Patient was also instructed that they will need to review over the PAT instructions again at home before surgery.

## 2022-05-26 ENCOUNTER — Encounter (HOSPITAL_COMMUNITY): Payer: Self-pay | Admitting: Orthopedic Surgery

## 2022-05-26 ENCOUNTER — Encounter (HOSPITAL_COMMUNITY)
Admission: RE | Disposition: A | Discharge status: Home / Self Care | Payer: Self-pay | Source: Home / Self Care | Attending: Orthopedic Surgery

## 2022-05-26 ENCOUNTER — Ambulatory Visit (HOSPITAL_COMMUNITY)
Admission: RE | Admit: 2022-05-26 | Discharge: 2022-05-26 | Disposition: A | Discharge status: Home / Self Care | Payer: Medicare HMO | Attending: Orthopedic Surgery | Admitting: Orthopedic Surgery

## 2022-05-26 ENCOUNTER — Other Ambulatory Visit: Payer: Self-pay

## 2022-05-26 ENCOUNTER — Ambulatory Visit (HOSPITAL_BASED_OUTPATIENT_CLINIC_OR_DEPARTMENT_OTHER): Payer: Medicare HMO | Admitting: Certified Registered"

## 2022-05-26 ENCOUNTER — Ambulatory Visit (HOSPITAL_COMMUNITY): Payer: Medicare HMO | Admitting: Physician Assistant

## 2022-05-26 DIAGNOSIS — M75101 Unspecified rotator cuff tear or rupture of right shoulder, not specified as traumatic: Secondary | ICD-10-CM

## 2022-05-26 DIAGNOSIS — Z96611 Presence of right artificial shoulder joint: Secondary | ICD-10-CM | POA: Diagnosis not present

## 2022-05-26 DIAGNOSIS — F418 Other specified anxiety disorders: Secondary | ICD-10-CM

## 2022-05-26 DIAGNOSIS — G8918 Other acute postprocedural pain: Secondary | ICD-10-CM | POA: Diagnosis not present

## 2022-05-26 DIAGNOSIS — S46011A Strain of muscle(s) and tendon(s) of the rotator cuff of right shoulder, initial encounter: Secondary | ICD-10-CM | POA: Diagnosis not present

## 2022-05-26 DIAGNOSIS — R29818 Other symptoms and signs involving the nervous system: Secondary | ICD-10-CM

## 2022-05-26 DIAGNOSIS — G4730 Sleep apnea, unspecified: Secondary | ICD-10-CM | POA: Diagnosis not present

## 2022-05-26 HISTORY — PX: REVERSE SHOULDER ARTHROPLASTY: SHX5054

## 2022-05-26 LAB — HEMOGLOBIN A1C
Hgb A1c MFr Bld: 5.5 % (ref 4.8–5.6)
Mean Plasma Glucose: 111 mg/dL

## 2022-05-26 SURGERY — ARTHROPLASTY, SHOULDER, TOTAL, REVERSE
Anesthesia: Regional | Site: Shoulder | Laterality: Right

## 2022-05-26 MED ORDER — OXYCODONE HCL 5 MG PO TABS: 5.0000 mg | ORAL_TABLET | Freq: Four times a day (QID) | ORAL | 0 refills | Status: DC | PRN

## 2022-05-26 MED ORDER — ACETAMINOPHEN 10 MG/ML IV SOLN
INTRAVENOUS | Status: AC
  Filled 2022-05-26: qty 100

## 2022-05-26 MED ORDER — FENTANYL CITRATE PF 50 MCG/ML IJ SOSY: 25.0000 ug | PREFILLED_SYRINGE | INTRAMUSCULAR | Status: DC | PRN

## 2022-05-26 MED ORDER — ORAL CARE MOUTH RINSE: 15.0000 mL | Freq: Once | OROMUCOSAL | Status: AC

## 2022-05-26 MED ORDER — ONDANSETRON HCL 4 MG/2ML IJ SOLN
INTRAMUSCULAR | Status: DC | PRN
  Administered 2022-05-26: 4 mg via INTRAVENOUS

## 2022-05-26 MED ORDER — DEXAMETHASONE SODIUM PHOSPHATE 10 MG/ML IJ SOLN
INTRAMUSCULAR | Status: DC | PRN
  Administered 2022-05-26: 10 mg via INTRAVENOUS

## 2022-05-26 MED ORDER — METOCLOPRAMIDE HCL 5 MG PO TABS: 5.0000 mg | ORAL_TABLET | Freq: Three times a day (TID) | ORAL | Status: DC | PRN

## 2022-05-26 MED ORDER — ONDANSETRON HCL 4 MG/2ML IJ SOLN
INTRAMUSCULAR | Status: AC
  Filled 2022-05-26: qty 2

## 2022-05-26 MED ORDER — DEXAMETHASONE SODIUM PHOSPHATE 10 MG/ML IJ SOLN
INTRAMUSCULAR | Status: AC
  Filled 2022-05-26: qty 1

## 2022-05-26 MED ORDER — METHOCARBAMOL 500 MG PO TABS: 500.0000 mg | ORAL_TABLET | Freq: Four times a day (QID) | ORAL | Status: DC | PRN

## 2022-05-26 MED ORDER — FENTANYL CITRATE (PF) 250 MCG/5ML IJ SOLN
INTRAMUSCULAR | Status: DC | PRN
  Administered 2022-05-26: 50 ug via INTRAVENOUS

## 2022-05-26 MED ORDER — SODIUM CHLORIDE 0.9 % IR SOLN
Status: DC | PRN
  Administered 2022-05-26: 1000 mL

## 2022-05-26 MED ORDER — LIDOCAINE 20MG/ML (2%) 15 ML SYRINGE OPTIME
INTRAMUSCULAR | Status: DC | PRN
  Administered 2022-05-26: 20 mg via INTRAVENOUS

## 2022-05-26 MED ORDER — METHOCARBAMOL 500 MG PO TABS: 500.0000 mg | ORAL_TABLET | Freq: Three times a day (TID) | ORAL | 0 refills | Status: DC | PRN

## 2022-05-26 MED ORDER — METOCLOPRAMIDE HCL 5 MG/ML IJ SOLN
5.0000 mg | Freq: Three times a day (TID) | INTRAMUSCULAR | Status: DC | PRN
  Administered 2022-05-26: 10 mg via INTRAVENOUS

## 2022-05-26 MED ORDER — CEFAZOLIN SODIUM-DEXTROSE 2-4 GM/100ML-% IV SOLN
2.0000 g | INTRAVENOUS | Status: AC
  Administered 2022-05-26: 2 g via INTRAVENOUS
  Filled 2022-05-26: qty 100

## 2022-05-26 MED ORDER — ACETAMINOPHEN 500 MG PO TABS: 1000.0000 mg | ORAL_TABLET | Freq: Four times a day (QID) | ORAL | Status: DC

## 2022-05-26 MED ORDER — METHOCARBAMOL 500 MG IVPB - SIMPLE MED: 500.0000 mg | Freq: Four times a day (QID) | INTRAVENOUS | Status: DC | PRN

## 2022-05-26 MED ORDER — OXYCODONE HCL 5 MG PO TABS: 5.0000 mg | ORAL_TABLET | ORAL | Status: DC | PRN

## 2022-05-26 MED ORDER — MIDAZOLAM HCL 2 MG/2ML IJ SOLN
INTRAMUSCULAR | Status: AC
  Filled 2022-05-26: qty 2

## 2022-05-26 MED ORDER — CHLORHEXIDINE GLUCONATE 0.12 % MT SOLN
15.0000 mL | Freq: Once | OROMUCOSAL | Status: AC
  Administered 2022-05-26: 15 mL via OROMUCOSAL

## 2022-05-26 MED ORDER — AMISULPRIDE (ANTIEMETIC) 5 MG/2ML IV SOLN
10.0000 mg | Freq: Once | INTRAVENOUS | Status: AC
  Administered 2022-05-26: 10 mg via INTRAVENOUS

## 2022-05-26 MED ORDER — FENTANYL CITRATE (PF) 100 MCG/2ML IJ SOLN
INTRAMUSCULAR | Status: AC
  Filled 2022-05-26: qty 2

## 2022-05-26 MED ORDER — METOCLOPRAMIDE HCL 5 MG/ML IJ SOLN
INTRAMUSCULAR | Status: AC
  Filled 2022-05-26: qty 2

## 2022-05-26 MED ORDER — BUPIVACAINE LIPOSOME 1.3 % IJ SUSP
INTRAMUSCULAR | Status: DC | PRN
  Administered 2022-05-26: 10 mL via PERINEURAL

## 2022-05-26 MED ORDER — PHENYLEPHRINE 80 MCG/ML (10ML) SYRINGE FOR IV PUSH (FOR BLOOD PRESSURE SUPPORT)
PREFILLED_SYRINGE | INTRAVENOUS | Status: DC | PRN
  Administered 2022-05-26: 80 ug via INTRAVENOUS
  Administered 2022-05-26 (×2): 160 ug via INTRAVENOUS
  Administered 2022-05-26: 80 ug via INTRAVENOUS
  Administered 2022-05-26: 160 ug via INTRAVENOUS

## 2022-05-26 MED ORDER — ACETAMINOPHEN 325 MG PO TABS: 325.0000 mg | ORAL_TABLET | Freq: Four times a day (QID) | ORAL | Status: DC | PRN

## 2022-05-26 MED ORDER — HYDROMORPHONE HCL 1 MG/ML IJ SOLN: 0.5000 mg | INTRAMUSCULAR | Status: DC | PRN

## 2022-05-26 MED ORDER — TRANEXAMIC ACID-NACL 1000-0.7 MG/100ML-% IV SOLN
1000.0000 mg | INTRAVENOUS | Status: AC
  Administered 2022-05-26: 1000 mg via INTRAVENOUS
  Filled 2022-05-26: qty 100

## 2022-05-26 MED ORDER — LIDOCAINE HCL (PF) 2 % IJ SOLN
INTRAMUSCULAR | Status: AC
  Filled 2022-05-26: qty 5

## 2022-05-26 MED ORDER — ONDANSETRON HCL 4 MG PO TABS: 4.0000 mg | ORAL_TABLET | Freq: Four times a day (QID) | ORAL | Status: DC | PRN

## 2022-05-26 MED ORDER — PHENYLEPHRINE HCL-NACL 20-0.9 MG/250ML-% IV SOLN
INTRAVENOUS | Status: DC | PRN
  Administered 2022-05-26: 30 ug/min via INTRAVENOUS

## 2022-05-26 MED ORDER — ROCURONIUM BROMIDE 10 MG/ML (PF) SYRINGE
PREFILLED_SYRINGE | INTRAVENOUS | Status: AC
  Filled 2022-05-26: qty 10

## 2022-05-26 MED ORDER — ACETAMINOPHEN 10 MG/ML IV SOLN
INTRAVENOUS | Status: DC | PRN
  Administered 2022-05-26: 1000 mg via INTRAVENOUS

## 2022-05-26 MED ORDER — PHENYLEPHRINE HCL (PRESSORS) 10 MG/ML IV SOLN
INTRAVENOUS | Status: AC
  Filled 2022-05-26: qty 1

## 2022-05-26 MED ORDER — ROCURONIUM BROMIDE 10 MG/ML (PF) SYRINGE
PREFILLED_SYRINGE | INTRAVENOUS | Status: DC | PRN
  Administered 2022-05-26: 100 mg via INTRAVENOUS

## 2022-05-26 MED ORDER — ACETAMINOPHEN 500 MG PO TABS: 1000.0000 mg | ORAL_TABLET | Freq: Once | ORAL | Status: DC

## 2022-05-26 MED ORDER — WATER FOR IRRIGATION, STERILE IR SOLN
Status: DC | PRN
  Administered 2022-05-26: 2000 mL

## 2022-05-26 MED ORDER — ONDANSETRON HCL 4 MG/2ML IJ SOLN
4.0000 mg | Freq: Four times a day (QID) | INTRAMUSCULAR | Status: DC | PRN
  Administered 2022-05-26: 4 mg via INTRAVENOUS

## 2022-05-26 MED ORDER — MIDAZOLAM HCL 5 MG/5ML IJ SOLN
INTRAMUSCULAR | Status: DC | PRN
  Administered 2022-05-26: 2 mg via INTRAVENOUS

## 2022-05-26 MED ORDER — LACTATED RINGERS IV SOLN: INTRAVENOUS | Status: DC

## 2022-05-26 MED ORDER — OXYCODONE HCL 5 MG PO TABS: 10.0000 mg | ORAL_TABLET | ORAL | Status: DC | PRN

## 2022-05-26 MED ORDER — BUPIVACAINE HCL (PF) 0.5 % IJ SOLN
INTRAMUSCULAR | Status: DC | PRN
  Administered 2022-05-26: 15 mL via PERINEURAL

## 2022-05-26 MED ORDER — SUGAMMADEX SODIUM 500 MG/5ML IV SOLN
INTRAVENOUS | Status: DC | PRN
  Administered 2022-05-26: 500 mg via INTRAVENOUS

## 2022-05-26 MED ORDER — PROPOFOL 10 MG/ML IV BOLUS
INTRAVENOUS | Status: DC | PRN
  Administered 2022-05-26: 150 mg via INTRAVENOUS

## 2022-05-26 MED ORDER — AMISULPRIDE (ANTIEMETIC) 5 MG/2ML IV SOLN
INTRAVENOUS | Status: AC
  Filled 2022-05-26: qty 4

## 2022-05-26 MED ORDER — 0.9 % SODIUM CHLORIDE (POUR BTL) OPTIME
TOPICAL | Status: DC | PRN
  Administered 2022-05-26: 1000 mL

## 2022-05-26 SURGICAL SUPPLY — 78 items
BAG COUNTER SPONGE SURGICOUNT (BAG) IMPLANT
BAG ZIPLOCK 12X15 (MISCELLANEOUS) ×1 IMPLANT
BASEPLATE P2 COATD GLND 6.5X30 (Shoulder) IMPLANT
BIT DRILL 1.6MX128 (BIT) IMPLANT
BIT DRILL 2.5 DIA 127 CALI (BIT) IMPLANT
BIT DRILL 4 DIA CALIBRATED (BIT) IMPLANT
BLADE SAW SGTL 73X25 THK (BLADE) ×1 IMPLANT
BOOTIES KNEE HIGH SLOAN (MISCELLANEOUS) ×2 IMPLANT
COOLER ICEMAN CLASSIC (MISCELLANEOUS) IMPLANT
COVER BACK TABLE 60X90IN (DRAPES) ×1 IMPLANT
COVER SURGICAL LIGHT HANDLE (MISCELLANEOUS) ×1 IMPLANT
DRAPE INCISE IOBAN 66X45 STRL (DRAPES) ×1 IMPLANT
DRAPE ORTHO SPLIT 77X108 STRL (DRAPES) ×2
DRAPE POUCH INSTRU U-SHP 10X18 (DRAPES) ×1 IMPLANT
DRAPE SHEET LG 3/4 BI-LAMINATE (DRAPES) ×1 IMPLANT
DRAPE SURG 17X11 SM STRL (DRAPES) ×1 IMPLANT
DRAPE SURG ORHT 6 SPLT 77X108 (DRAPES) ×2 IMPLANT
DRAPE TOP 10253 STERILE (DRAPES) ×1 IMPLANT
DRAPE U-SHAPE 47X51 STRL (DRAPES) ×1 IMPLANT
DRSG AQUACEL AG ADV 3.5X 6 (GAUZE/BANDAGES/DRESSINGS) ×1 IMPLANT
DURAPREP 26ML APPLICATOR (WOUND CARE) ×2 IMPLANT
ELECT BLADE TIP CTD 4 INCH (ELECTRODE) ×1 IMPLANT
ELECT REM PT RETURN 15FT ADLT (MISCELLANEOUS) ×1 IMPLANT
FACESHIELD WRAPAROUND (MASK) ×1 IMPLANT
FACESHIELD WRAPAROUND OR TEAM (MASK) ×1 IMPLANT
GLOVE BIO SURGEON STRL SZ7.5 (GLOVE) ×1 IMPLANT
GLOVE BIOGEL PI IND STRL 6.5 (GLOVE) ×1 IMPLANT
GLOVE BIOGEL PI IND STRL 8 (GLOVE) ×1 IMPLANT
GLOVE SURG SS PI 6.5 STRL IVOR (GLOVE) ×1 IMPLANT
GOWN STRL REUS W/ TWL LRG LVL3 (GOWN DISPOSABLE) ×1 IMPLANT
GOWN STRL REUS W/ TWL XL LVL3 (GOWN DISPOSABLE) ×1 IMPLANT
GOWN STRL REUS W/TWL LRG LVL3 (GOWN DISPOSABLE) ×1
GOWN STRL REUS W/TWL XL LVL3 (GOWN DISPOSABLE) ×1
HANDPIECE INTERPULSE COAX TIP (DISPOSABLE) ×1
HOOD PEEL AWAY T7 (MISCELLANEOUS) ×3 IMPLANT
INSERT EPOLY STND HUMERUS 32MM (Shoulder) ×1 IMPLANT
INSERT EPOLYSTD HUMERUS 32MM (Shoulder) IMPLANT
KIT BASIN OR (CUSTOM PROCEDURE TRAY) ×1 IMPLANT
KIT TURNOVER KIT A (KITS) IMPLANT
MANIFOLD NEPTUNE II (INSTRUMENTS) ×1 IMPLANT
NDL TROCAR POINT SZ 2 1/2 (NEEDLE) IMPLANT
NEEDLE TROCAR POINT SZ 2 1/2 (NEEDLE) IMPLANT
NS IRRIG 1000ML POUR BTL (IV SOLUTION) ×1 IMPLANT
P2 COATDE GLNOID BSEPLT 6.5X30 (Shoulder) ×1 IMPLANT
PACK SHOULDER (CUSTOM PROCEDURE TRAY) ×1 IMPLANT
PAD COLD SHLDR WRAP-ON (PAD) IMPLANT
PROTECTOR NERVE ULNAR (MISCELLANEOUS) IMPLANT
RESTRAINT HEAD UNIVERSAL NS (MISCELLANEOUS) ×1 IMPLANT
RETRIEVER SUT HEWSON (MISCELLANEOUS) IMPLANT
SCREW BONE LOCKING RSP 5.0X14 (Screw) ×1 IMPLANT
SCREW BONE LOCKING RSP 5.0X30 (Screw) ×1 IMPLANT
SCREW BONE RSP LOCK 5X14 (Screw) IMPLANT
SCREW BONE RSP LOCK 5X18 (Screw) IMPLANT
SCREW BONE RSP LOCK 5X26 (Screw) IMPLANT
SCREW BONE RSP LOCK 5X30 (Screw) IMPLANT
SCREW BONE RSP LOCKING 18MM LG (Screw) ×1 IMPLANT
SCREW BONE RSP LOCKING 5.0X26 (Screw) ×1 IMPLANT
SCREW RETAIN W/HEAD 32MM (Shoulder) IMPLANT
SET HNDPC FAN SPRY TIP SCT (DISPOSABLE) ×1 IMPLANT
SLING ARM FOAM STRAP LRG (SOFTGOODS) IMPLANT
SLING ARM IMMOBILIZER LRG (SOFTGOODS) IMPLANT
SLING ARM IMMOBILIZER MED (SOFTGOODS) IMPLANT
STEM HUMERAL STD SHELL 10X48 (Stem) IMPLANT
STRIP CLOSURE SKIN 1/2X4 (GAUZE/BANDAGES/DRESSINGS) ×2 IMPLANT
SUCTION FRAZIER HANDLE 10FR (MISCELLANEOUS)
SUCTION TUBE FRAZIER 10FR DISP (MISCELLANEOUS) IMPLANT
SUPPORT WRAP ARM LG (MISCELLANEOUS) IMPLANT
SUT ETHIBOND 2 V 37 (SUTURE) IMPLANT
SUT FIBERWIRE #2 38 REV NDL BL (SUTURE)
SUT MNCRL AB 4-0 PS2 18 (SUTURE) ×1 IMPLANT
SUT VIC AB 2-0 CT1 27 (SUTURE) ×2
SUT VIC AB 2-0 CT1 TAPERPNT 27 (SUTURE) ×2 IMPLANT
SUTURE FIBERWR#2 38 REV NDL BL (SUTURE) IMPLANT
TAPE LABRALWHITE 1.5X36 (TAPE) IMPLANT
TAPE SUT LABRALTAP WHT/BLK (SUTURE) IMPLANT
TOWEL OR 17X26 10 PK STRL BLUE (TOWEL DISPOSABLE) ×1 IMPLANT
TOWEL OR NON WOVEN STRL DISP B (DISPOSABLE) ×1 IMPLANT
WATER STERILE IRR 1000ML POUR (IV SOLUTION) ×1 IMPLANT

## 2022-05-26 NOTE — Anesthesia Procedure Notes (Signed)
Procedure Name: Intubation Date/Time: 05/19/2022 7:38 AM  Performed by: Pilar Grammes, CRNAPre-anesthesia Checklist: Patient identified, Emergency Drugs available, Suction available, Patient being monitored and Timeout performed Patient Re-evaluated:Patient Re-evaluated prior to induction Oxygen Delivery Method: Circle system utilized Preoxygenation: Pre-oxygenation with 100% oxygen Induction Type: IV induction Ventilation: Mask ventilation without difficulty Laryngoscope Size: Miller and 2 Grade View: Grade I Tube type: Oral Tube size: 7.5 mm Number of attempts: 1 Airway Equipment and Method: Stylet Placement Confirmation: positive ETCO2, ETT inserted through vocal cords under direct vision, CO2 detector and breath sounds checked- equal and bilateral Secured at: 22 cm Tube secured with: Tape Dental Injury: Teeth and Oropharynx as per pre-operative assessment

## 2022-05-26 NOTE — Op Note (Signed)
Procedure(s): REVERSE SHOULDER ARTHROPLASTY Procedure Note  Jeffrey Stokes male 61 y.o. 05/26/2022  Preoperative diagnosis: Right shoulder massive irreparable rotator cuff tear with pseudoparalysis  Operative diagnosis: Same  Procedure(s) and Anesthesia Type:    * REVERSE SHOULDER ARTHROPLASTY - Choice   Indications:  61 y.o. male  With right shoulder irrepairable rotator cuff tear with pseudoparalysis.  Pain and dysfunction interfered with quality of life and nonoperative treatment with activity modification, NSAIDS and injections failed.     Surgeon: Rhae Hammock   Assistants: Sheryle Hail PA-C Amber was present and scrubbed throughout the procedure and was essential in positioning, retraction, exposure, and closure)  Anesthesia: General endotracheal anesthesia with preoperative interscalene block given by the attending anesthesiologist   Procedure Detail  REVERSE SHOULDER ARTHROPLASTY   Estimated Blood Loss:  200 mL         Drains: none  Blood Given: none          Specimens: none        Complications:  * No complications entered in OR log *         Disposition: PACU - hemodynamically stable.         Condition: stable      OPERATIVE FINDINGS:  A DJO Altivate pressfit reverse total shoulder arthroplasty was placed with a  size 10 stem, a 32 neutral glenosphere, and a standard-mm poly insert. The base plate  fixation was excellent.  PROCEDURE: The patient was identified in the preoperative holding area  where I personally marked the operative site after verifying site, side,  and procedure with the patient. An interscalene block given by  the attending anesthesiologist in the holding area and the patient was taken back to the operating room where all extremities were  carefully padded in position after general anesthesia was induced. She  was placed in a beach-chair position and the operative upper extremity was  prepped and draped in a standard sterile  fashion. An approximately 10-  cm incision was made from the tip of the coracoid process to the center  point of the humerus at the level of the axilla. Dissection was carried  down through subcutaneous tissues to the level of the cephalic vein  which was taken laterally with the deltoid. The pectoralis major was  retracted medially. The subdeltoid space was developed and the lateral  edge of the conjoined tendon was identified. The undersurface of  conjoined tendon was palpated and the musculocutaneous nerve was not in  the field. Retractor was placed underneath the conjoined and second  retractor was placed lateral into the deltoid. The circumflex humeral  artery and vessels were identified and clamped and coagulated. The  biceps tendon was tenodesed to the upper border of the pectoralis major.  The subscapularis was chronically torn.  The  joint was then gently externally rotated while the capsule was released  from the humeral neck around to just beyond the 6 o'clock position. At  this point, the joint was dislocated and the humeral head was presented  into the wound. The excessive osteophyte formation was removed with a  large rongeur.  The cutting guide was used to make the appropriate  head cut and the head was saved for potentially bone grafting.  The glenoid was exposed with the arm in an  abducted extended position. The anterior and posterior labrum were  completely excised and the capsule was released circumferentially to  allow for exposure of the glenoid for preparation. The 2.5 mm drill was  placed using the guide in 5-10 inferior angulation and the tap was then advanced in the same hole. Small and large reamers were then used. The tap was then removed and the Metaglene was then screwed in with excellent purchase.  The peripheral guide was then used to drilled measured and filled peripheral locking screws. The size 32 neutral glenosphere was then impacted on the [facility] taper and  the central screw was placed. The humerus was then again exposed and the diaphyseal reamers were used followed by the metaphyseal reamers. The final broach was left in place in the proximal trial was placed. The joint was reduced and with this implant it was felt that soft tissue tensioning was appropriate with excellent stability and excellent range of motion. Therefore, final humeral stem was placed press-fit.  And then the trial polyethylene inserts were tested again and the above implant was felt to be the most appropriate for final insertion. The joint was reduced taken through full range of motion and felt to be stable. Soft tissue tension was appropriate.  The joint was then copiously irrigated with pulse  lavage and the wound was then closed. The subscapularis was not repaired.  Skin was closed with 2-0 Vicryl in a deep dermal layer and 4-0  Monocryl for skin closure. Steri-Strips were applied. Sterile  dressings were then applied as well as a sling. The patient was allowed  to awaken from general anesthesia, transferred to stretcher, and taken  to recovery room in stable condition.   POSTOPERATIVE PLAN: The patient will be observed in the recovery room and if his pain is well-controlled with the regional anesthesia and he is hemodynamically stable he can be discharged home today with family.

## 2022-05-26 NOTE — Anesthesia Procedure Notes (Signed)
Anesthesia Regional Block: Interscalene brachial plexus block   Pre-Anesthetic Checklist: , timeout performed,  Correct Patient, Correct Site, Correct Laterality,  Correct Procedure, Correct Position, site marked,  Risks and benefits discussed,  Pre-op evaluation,  At surgeon's request and post-op pain management  Laterality: Right  Prep: Maximum Sterile Barrier Precautions used, chloraprep       Needles:  Injection technique: Single-shot  Needle Type: Echogenic Stimulator Needle     Needle Length: 5cm  Needle Gauge: 21     Additional Needles:   Procedures:,,,, ultrasound used (permanent image in chart),,    Narrative:  Start time: 05/26/2022 7:10 AM End time: 05/26/2022 7:15 AM Injection made incrementally with aspirations every 5 mL. Anesthesiologist: Freddrick March, MD

## 2022-05-26 NOTE — Anesthesia Preprocedure Evaluation (Addendum)
Anesthesia Evaluation  Patient identified by MRN, date of birth, ID band Patient awake    Reviewed: Allergy & Precautions, NPO status , Patient's Chart, lab work & pertinent test results  Airway Mallampati: III  TM Distance: >3 FB Neck ROM: Full  Mouth opening: Limited Mouth Opening  Dental  (+) Edentulous Upper, Edentulous Lower, Dental Advisory Given   Pulmonary sleep apnea (no CPAP) , former smoker   Pulmonary exam normal breath sounds clear to auscultation       Cardiovascular negative cardio ROS Normal cardiovascular exam Rhythm:Regular Rate:Normal     Neuro/Psych  PSYCHIATRIC DISORDERS Anxiety Depression    TIACVA (right sided weakness)    GI/Hepatic Neg liver ROS,GERD  ,,  Endo/Other  negative endocrine ROS    Renal/GU negative Renal ROS  negative genitourinary   Musculoskeletal negative musculoskeletal ROS (+)    Abdominal   Peds  Hematology negative hematology ROS (+)   Anesthesia Other Findings   Reproductive/Obstetrics                             Anesthesia Physical Anesthesia Plan  ASA: 3  Anesthesia Plan: General and Regional   Post-op Pain Management: Regional block* and Tylenol PO (pre-op)*   Induction: Intravenous  PONV Risk Score and Plan: 2 and Midazolam, Dexamethasone and Ondansetron  Airway Management Planned: Oral ETT  Additional Equipment:   Intra-op Plan:   Post-operative Plan: Extubation in OR  Informed Consent: I have reviewed the patients History and Physical, chart, labs and discussed the procedure including the risks, benefits and alternatives for the proposed anesthesia with the patient or authorized representative who has indicated his/her understanding and acceptance.     Dental advisory given  Plan Discussed with: CRNA  Anesthesia Plan Comments:        Anesthesia Quick Evaluation

## 2022-05-26 NOTE — Transfer of Care (Signed)
Immediate Anesthesia Transfer of Care Note  Patient: Jeffrey Stokes  Procedure(s) Performed: REVERSE SHOULDER ARTHROPLASTY (Right: Shoulder)  Patient Location: PACU  Anesthesia Type:General and GA combined with regional for post-op pain  Level of Consciousness: drowsy and patient cooperative  Airway & Oxygen Therapy: Patient Spontanous Breathing and Patient connected to face mask oxygen  Post-op Assessment: Report given to RN and Post -op Vital signs reviewed and stable  Post vital signs: stable  Last Vitals:  Vitals Value Taken Time  BP 131/83 05/26/22 0902  Temp    Pulse 72 05/26/22 0904  Resp 18 05/26/22 0904  SpO2 97 % 05/26/22 0904  Vitals shown include unvalidated device data.  Last Pain:  Vitals:   05/26/22 0553  TempSrc:   PainSc: 0-No pain      Patients Stated Pain Goal: 4 (83/67/25 5001)  Complications: No notable events documented.

## 2022-05-26 NOTE — H&P (Signed)
OSEPH Stokes is an 61 y.o. male.   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  Failed conservative treatment.  Past Medical History:  Diagnosis Date   Allergy    Anxiety    Benign neoplasm of ascending colon    Benign neoplasm of descending colon    Benign neoplasm of transverse colon    Depression    GERD (gastroesophageal reflux disease)    History of motion sickness    laying on back   HOH (hard of hearing)    bilateral   Hx-TIA (transient ischemic attack) 2012   Sleep apnea    had one sleep study, inconclusive,needs repeat   Stroke ([place])    3 strokes last one on 2012/ mouth tilt/ weakness right hand side   Wears dentures    full upper and lower    Past Surgical History:  Procedure Laterality Date   COLONOSCOPY WITH PROPOFOL N/A 06/01/2015   Procedure: COLONOSCOPY WITH PROPOFOL;  Surgeon: [name], MD;  Location: [facility];  Service: Endoscopy;  Laterality: N/A;  ASCENDING COLON POLYP DESCENDING COLON POLYP TRANSVERSE COLON POLYP   COLONOSCOPY WITH PROPOFOL N/A 02/19/2021   Procedure: COLONOSCOPY WITH PROPOFOL;  Surgeon: [name], MD;  Location: [place];  Service: Endoscopy;  Laterality: N/A;   EYE SURGERY     Age 78    POLYPECTOMY  02/19/2021   Procedure: POLYPECTOMY;  Surgeon: [name], MD;  Location: [place];  Service: Endoscopy;;   SALIVARY GLAND SURGERY     SHOULDER ARTHROSCOPY Left 03/2014   bicep and nerve problems still   TONSILLECTOMY     UMBILICAL HERNIA REPAIR N/A 08/25/2015   Procedure: HERNIA REPAIR UMBILICAL ADULT;  Surgeon: Jules Husbands, MD;  Location: [facility];  Service: General;  Laterality: N/A;    Family History  Problem Relation Age of Onset   Osteoarthritis Father    Stroke Father    Social History:  reports that he quit smoking about 16 years ago. His smoking use included cigarettes. He started smoking about 50 years  ago. He has a 66.00 pack-year smoking history. He has quit using smokeless tobacco.  His smokeless tobacco use included chew. He reports that he does not drink alcohol and does not use drugs.  Allergies:  Allergies  Allergen Reactions   Hydrocodone Nausea Only and Anxiety   Lyrica [Pregabalin] Other (See Comments)    "drunk feeling" "felt like I was going to die"    Morphine Other (See Comments)    Not effective     Medications Prior to Admission  Medication Sig Dispense Refill   acetaminophen (TYLENOL) 650 MG CR tablet Take 1,300 mg by mouth as needed for pain (headache).     aspirin EC 81 MG tablet Take 81 mg by mouth at bedtime.     clotrimazole-betamethasone (LOTRISONE) cream Apply 1 application. topically daily. To shaft of penis and under the scrotum (Patient taking differently: Apply 1 application  topically as needed (rash). To shaft of penis and under the scrotum) 30 g 0   Magnesium 300 MG CAPS Take 300 mg by mouth every other day.     Multiple Vitamins-Minerals (MULTIVITAMIN ADULT PO) Take 1 tablet by mouth daily. When able to remember     naproxen sodium (ALEVE) 220 MG tablet Take 440 mg by mouth as needed (headache).     omeprazole (PRILOSEC) 20 MG capsule Take 20 mg by mouth  every other day.     potassium chloride (KLOR-CON) 20 MEQ packet Take 20 mEq by mouth every other day.     tamsulosin (FLOMAX) 0.4 MG CAPS capsule Take 1 capsule (0.4 mg total) by mouth at bedtime. (Patient taking differently: Take 0.4 mg by mouth daily.) 30 capsule 3   zinc gluconate 50 MG tablet Take 50 mg by mouth every other day.     albuterol (VENTOLIN HFA) 108 (90 Base) MCG/ACT inhaler Inhale 2 puffs into the lungs every 6 (six) hours as needed for wheezing or shortness of breath. (Patient not taking: Reported on 05/25/2022) 1 each 0   aspirin EC 325 MG EC tablet Take 1 tablet (325 mg total) by mouth daily. (Patient not taking: Reported on 05/25/2022) 30 tablet 0   Doxylamine-DM (VICKS DAYQUIL/NYQUIL  COUGH PO) Take 2 tablets by mouth daily as needed (cough).      Results for orders placed or performed during the hospital encounter of 05/25/22 (from the past 48 hour(s))  Surgical pcr screen     Status: None   Collection Time: 05/25/22  9:47 AM   Specimen: Nasal Mucosa; Nasal Swab  Result Value Ref Range   MRSA, PCR NEGATIVE NEGATIVE   Staphylococcus aureus NEGATIVE NEGATIVE    Comment: (NOTE) The Xpert SA Assay (FDA approved for NASAL specimens in patients 16 years of age and older), is one component of a comprehensive surveillance program. It is not intended to diagnose infection nor to guide or monitor treatment. Performed at [facility], [place] [address], [place], [place] [address]   Basic metabolic panel per protocol     Status: Abnormal   Collection Time: 05/25/22  9:47 AM  Result Value Ref Range   Sodium 140 135 - 145 mmol/L   Potassium 4.2 3.5 - 5.1 mmol/L   Chloride 107 98 - 111 mmol/L   CO2 28 22 - 32 mmol/L   Glucose, Bld 106 (H) 70 - 99 mg/dL    Comment: Glucose reference range applies only to samples taken after fasting for at least 8 hours.   BUN 12 6 - 20 mg/dL   Creatinine, Ser 0.95 0.61 - 1.24 mg/dL   Calcium 9.3 8.9 - 10.3 mg/dL   GFR, Estimated >60 >60 mL/min    Comment: (NOTE) Calculated using the CKD-EPI Creatinine Equation (2021)    Anion gap 5 5 - 15    Comment: Performed at [facility], [place] [address] [place], [place] [address]  CBC per protocol     Status: None   Collection Time: 05/25/22  9:47 AM  Result Value Ref Range   WBC 8.3 4.0 - 10.5 K/uL   RBC 4.60 4.22 - 5.81 MIL/uL   Hemoglobin 15.5 13.0 - 17.0 g/dL   HCT 44.7 39.0 - 52.0 %   MCV 97.2 80.0 - 100.0 fL   MCH 33.7 26.0 - 34.0 pg   MCHC 34.7 30.0 - 36.0 g/dL   RDW 13.1 11.5 - 15.5 %   Platelets 219 150 - 400 K/uL   nRBC 0.0 0.0 - 0.2 %    Comment: Performed at [facility], [place] [address] [place],  [address]   Hemoglobin A1c     Status: None   Collection Time: 05/25/22  9:47 AM  Result Value Ref Range   Hgb A1c MFr Bld 5.5 4.8 - 5.6 %    Comment: (NOTE)         Prediabetes: 5.7 - 6.4  Diabetes: >6.4         Glycemic control for adults with diabetes: <7.0    Mean Plasma Glucose 111 mg/dL    Comment: (NOTE) Performed At: [facility] [place], [place] [id number] Rush Farmer MD KG:[id number]    DG Chest 2 View  Result Date: 05/25/2022 CLINICAL DATA:  Preoperative chest radiograph. EXAM: CHEST - 2 VIEW COMPARISON:  Chest radiograph August 20, 2015 FINDINGS: The heart size and mediastinal contours are within normal limits. No large area of consolidation. Stable biapical pleuroparenchymal thickening/scarring. The visualized skeletal structures are unremarkable. IMPRESSION: No active cardiopulmonary disease. Electronically Signed   By: Lovey Newcomer M.D.   On: 05/25/2022 15:00    Review of Systems  All other systems reviewed and are negative.   Blood pressure 134/85, pulse 69, temperature 97.6 F (36.4 C), temperature source Oral, resp. rate 13, height 6' (1.829 m), weight 93.4 kg, SpO2 97 %. Physical Exam Constitutional:      Appearance: He is well-developed.  HENT:     Head: Atraumatic.  Eyes:     Extraocular Movements: Extraocular movements intact.  Cardiovascular:     Pulses: Normal pulses.  Pulmonary:     Effort: Pulmonary effort is normal.  Musculoskeletal:     Comments: R shoulder pain and significant weakness with ROM. NVID.  Skin:    General: Skin is warm and dry.  Neurological:     Mental Status: He is alert and oriented to person, place, and time.  Psychiatric:        Mood and Affect: Mood normal.      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  Failed conservative treatment. Plan R reverese TSA Risks / benefits of surgery discussed Consent on chart  NPO for OR Preop  antibiotics   Rhae Hammock, MD 05/26/2022, 7:21 AM

## 2022-05-26 NOTE — Anesthesia Postprocedure Evaluation (Signed)
Anesthesia Post Note  Patient: [name]  Procedure(s) Performed: REVERSE SHOULDER ARTHROPLASTY (Right: Shoulder)     Patient location during evaluation: PACU Anesthesia Type: Regional and General Level of consciousness: awake and alert Pain management: pain level controlled Vital Signs Assessment: post-procedure vital signs reviewed and stable Respiratory status: spontaneous breathing, nonlabored ventilation, respiratory function stable and patient connected to nasal cannula oxygen Cardiovascular status: blood pressure returned to baseline and stable Postop Assessment: no apparent nausea or vomiting Anesthetic complications: no  No notable events documented.  Last Vitals:  Vitals:   05/26/22 1000 05/26/22 1045  BP: 136/86 129/79  Pulse: 65 68  Resp: 20   Temp:  36.8 C  SpO2: 93% 96%    Last Pain:  Vitals:   05/26/22 0945  TempSrc:   PainSc: Asleep                 Caidon Foti L Careli Luzader

## 2022-05-26 NOTE — Discharge Instructions (Addendum)

## 2022-05-26 NOTE — Evaluation (Signed)
Occupational Therapy Evaluation Patient Details Name: Jeffrey Stokes MRN: [id number] DOB: 1961-04-04 Today's Date: 05/26/2022   History of Present Illness Patient is a 61 year old man s/p right reverse shoulder arthroplasty.   Clinical Impression   Mr. Jeffrey Stokes presents s/p shoulder replacement without functional use of right dominant upper extremity secondary to effects of surgery and interscalene block and shoulder precautions. Therapist provided education and instruction to patient and spouse in regards to exercises, precautions, positioning, donning upper extremity clothing and bathing while maintaining shoulder precautions, ice and edema management and donning/doffing sling. Patient and spouse verbalized understanding and demonstrated as needed. Patient needed assistance to donn shirt, underwear, pants, socks and shoes and provided with instruction on compensatory strategies to perform ADLs. Patient to follow up with MD for further therapy needs.        Recommendations for follow up therapy are one component of a multi-disciplinary discharge planning process, led by the attending physician.  Recommendations may be updated based on patient status, additional functional criteria and insurance authorization.   Follow Up Recommendations  Follow physician's recommendations for discharge plan and follow up therapies     Assistance Recommended at Discharge Intermittent Supervision/Assistance  Patient can return home with the following Assistance with cooking/housework;A little help with bathing/dressing/bathroom    Functional Status Assessment  Patient has had a recent decline in their functional status and demonstrates the ability to make significant improvements in function in a reasonable and predictable amount of time.  Equipment Recommendations  None recommended by OT    Recommendations for Other Services       Precautions / Restrictions Precautions Precautions: Shoulder Type  of Shoulder Precautions: No Active ROM, No PROM Shoulder Interventions: Shoulder sling/immobilizer;Off for dressing/bathing/exercises Precaution Booklet Issued:  (handouts) Required Braces or Orthoses: Sling Restrictions Weight Bearing Restrictions: Yes RUE Weight Bearing: Non weight bearing      Mobility Bed Mobility                    Transfers                          Balance Overall balance assessment: Mild deficits observed, not formally tested                                         ADL either performed or assessed with clinical judgement   ADL Overall ADL's : Needs assistance/impaired Eating/Feeding: Set up   Grooming: Set up   Upper Body Bathing: Minimal assistance   Lower Body Bathing: Moderate assistance   Upper Body Dressing : Maximal assistance   Lower Body Dressing: Maximal assistance   Toilet Transfer: Supervision/safety   Toileting- Clothing Manipulation and Hygiene: Minimal assistance       Functional mobility during ADLs: Min guard       Vision Baseline Vision/History: 1 Wears glasses Patient Visual Report: No change from baseline       Perception     Praxis      Pertinent Vitals/Pain Pain Assessment Pain Assessment: No/denies pain (secondary to block)     Hand Dominance Right   Extremity/Trunk Assessment Upper Extremity Assessment Upper Extremity Assessment: RUE deficits/detail RUE Deficits / Details: impaired motor control and sensation secondary to block   Lower Extremity Assessment Lower Extremity Assessment: Overall WFL for tasks assessed   Cervical / Trunk Assessment Cervical /  Trunk Assessment: Normal   Communication Communication Communication: No difficulties   Cognition Arousal/Alertness: Awake/alert Behavior During Therapy: WFL for tasks assessed/performed Overall Cognitive Status: Within Functional Limits for tasks assessed                                        General Comments       Exercises     Shoulder Instructions Shoulder Instructions Donning/doffing shirt without moving shoulder: Caregiver independent with task Method for sponge bathing under operated UE: Caregiver independent with task Donning/doffing sling/immobilizer: Caregiver independent with task Correct positioning of sling/immobilizer: Caregiver independent with task ROM for elbow, wrist and digits of operated UE: Caregiver independent with task Sling wearing schedule (on at all times/off for ADL's): Caregiver independent with task Proper positioning of operated UE when showering: Caregiver independent with task Dressing change: Caregiver independent with task Positioning of UE while sleeping: Caregiver independent with task    Home Living Family/patient expects to be discharged to:: Private residence Living Arrangements: Spouse/significant other Available Help at Discharge: Family;Available 24 hours/day Type of Home: House Home Access: Stairs to enter [employer] of Steps: 3 Entrance Stairs-Rails: Right Home Layout: One level         Bathroom Toilet: Standard                Prior Functioning/Environment Prior Level of Function : Independent/Modified Independent                        OT Problem List: Decreased strength;Decreased range of motion;Pain;Impaired UE functional use      OT Treatment/Interventions:      OT Goals(Current goals can be found in the care plan section) Acute Rehab OT Goals OT Goal Formulation: All assessment and education complete, DC therapy  OT Frequency:      Co-evaluation              AM-PAC OT "6 Clicks" Daily Activity     Outcome Measure Help from another person eating meals?: A Little Help from another person taking care of personal grooming?: A Little Help from another person toileting, which includes using toliet, bedpan, or urinal?: A Little Help from another person bathing (including  washing, rinsing, drying)?: A Lot Help from another person to put on and taking off regular upper body clothing?: A Lot Help from another person to put on and taking off regular lower body clothing?: A Lot 6 Click Score: 15   End of Session Nurse Communication:  (OT education complete)  Activity Tolerance: Patient tolerated treatment well Patient left: in chair  OT Visit Diagnosis: Pain                Time: 1275-1700 OT Time Calculation (min): 15 min Charges:  OT General Charges $OT Visit: 1 Visit OT Evaluation $OT Eval Low Complexity: 1 Low  Gustavo Lah, OTR/L Acute Care Rehab Services  Office [phone]   Lenward Chancellor 05/26/2022, 12:04 PM

## 2022-05-27 ENCOUNTER — Encounter: Payer: Self-pay | Admitting: Internal Medicine

## 2022-05-27 ENCOUNTER — Encounter (HOSPITAL_COMMUNITY): Payer: Self-pay | Admitting: Orthopedic Surgery

## 2022-06-10 DIAGNOSIS — S46011A Strain of muscle(s) and tendon(s) of the rotator cuff of right shoulder, initial encounter: Secondary | ICD-10-CM | POA: Diagnosis not present

## 2022-06-29 ENCOUNTER — Other Ambulatory Visit: Payer: Self-pay

## 2022-06-29 ENCOUNTER — Encounter: Payer: Self-pay | Admitting: Internal Medicine

## 2022-06-29 DIAGNOSIS — N138 Other obstructive and reflux uropathy: Secondary | ICD-10-CM

## 2022-06-29 MED ORDER — TAMSULOSIN HCL 0.4 MG PO CAPS: 0.4000 mg | ORAL_CAPSULE | Freq: Every day | ORAL | 3 refills | Status: DC

## 2022-07-04 ENCOUNTER — Encounter: Payer: Medicare HMO | Admitting: Internal Medicine

## 2022-07-04 DIAGNOSIS — S46011A Strain of muscle(s) and tendon(s) of the rotator cuff of right shoulder, initial encounter: Secondary | ICD-10-CM | POA: Diagnosis not present

## 2022-07-13 DIAGNOSIS — M25511 Pain in right shoulder: Secondary | ICD-10-CM | POA: Diagnosis not present

## 2022-07-13 DIAGNOSIS — Z96611 Presence of right artificial shoulder joint: Secondary | ICD-10-CM | POA: Diagnosis not present

## 2022-07-13 DIAGNOSIS — M6281 Muscle weakness (generalized): Secondary | ICD-10-CM | POA: Diagnosis not present

## 2022-07-13 DIAGNOSIS — S40252A Superficial foreign body of left shoulder, initial encounter: Secondary | ICD-10-CM | POA: Diagnosis not present

## 2022-07-19 DIAGNOSIS — S40252A Superficial foreign body of left shoulder, initial encounter: Secondary | ICD-10-CM | POA: Diagnosis not present

## 2022-07-21 DIAGNOSIS — S40252A Superficial foreign body of left shoulder, initial encounter: Secondary | ICD-10-CM | POA: Diagnosis not present

## 2022-07-26 DIAGNOSIS — S40252A Superficial foreign body of left shoulder, initial encounter: Secondary | ICD-10-CM | POA: Diagnosis not present

## 2022-07-28 DIAGNOSIS — S40252A Superficial foreign body of left shoulder, initial encounter: Secondary | ICD-10-CM | POA: Diagnosis not present

## 2022-08-02 DIAGNOSIS — S40252A Superficial foreign body of left shoulder, initial encounter: Secondary | ICD-10-CM | POA: Diagnosis not present

## 2022-08-04 DIAGNOSIS — S40252A Superficial foreign body of left shoulder, initial encounter: Secondary | ICD-10-CM | POA: Diagnosis not present

## 2022-08-09 DIAGNOSIS — S40252A Superficial foreign body of left shoulder, initial encounter: Secondary | ICD-10-CM | POA: Diagnosis not present

## 2022-08-11 DIAGNOSIS — S40252A Superficial foreign body of left shoulder, initial encounter: Secondary | ICD-10-CM | POA: Diagnosis not present

## 2022-08-16 DIAGNOSIS — Z96611 Presence of right artificial shoulder joint: Secondary | ICD-10-CM | POA: Diagnosis not present

## 2022-08-16 DIAGNOSIS — M6281 Muscle weakness (generalized): Secondary | ICD-10-CM | POA: Diagnosis not present

## 2022-08-16 DIAGNOSIS — M25511 Pain in right shoulder: Secondary | ICD-10-CM | POA: Diagnosis not present

## 2022-08-16 DIAGNOSIS — S40252A Superficial foreign body of left shoulder, initial encounter: Secondary | ICD-10-CM | POA: Diagnosis not present

## 2022-08-17 DIAGNOSIS — S46011A Strain of muscle(s) and tendon(s) of the rotator cuff of right shoulder, initial encounter: Secondary | ICD-10-CM | POA: Diagnosis not present

## 2022-10-06 ENCOUNTER — Encounter: Payer: Self-pay | Admitting: Internal Medicine

## 2022-10-06 ENCOUNTER — Ambulatory Visit (INDEPENDENT_AMBULATORY_CARE_PROVIDER_SITE_OTHER): Payer: Medicare HMO | Admitting: Internal Medicine

## 2022-10-06 VITALS — BP 128/78 | HR 91 | Ht 72.0 in | Wt 217.0 lb

## 2022-10-06 DIAGNOSIS — Z8673 Personal history of transient ischemic attack (TIA), and cerebral infarction without residual deficits: Secondary | ICD-10-CM | POA: Diagnosis not present

## 2022-10-06 DIAGNOSIS — B3742 Candidal balanitis: Secondary | ICD-10-CM | POA: Diagnosis not present

## 2022-10-06 DIAGNOSIS — K644 Residual hemorrhoidal skin tags: Secondary | ICD-10-CM

## 2022-10-06 DIAGNOSIS — R739 Hyperglycemia, unspecified: Secondary | ICD-10-CM | POA: Diagnosis not present

## 2022-10-06 DIAGNOSIS — K219 Gastro-esophageal reflux disease without esophagitis: Secondary | ICD-10-CM | POA: Diagnosis not present

## 2022-10-06 MED ORDER — FLUCONAZOLE 100 MG PO TABS: 100.0000 mg | ORAL_TABLET | Freq: Every day | ORAL | 0 refills | Status: AC

## 2022-10-06 NOTE — Patient Instructions (Signed)
Take Omeprazole every day  GI will call you to address the hemorrhoids  We will call you with the results of the labs tests.  Take Diflucan - three doses but take them every other day.  Continue to use the cream.

## 2022-10-06 NOTE — Progress Notes (Signed)
Date:  10/06/2022   Name:  Jeffrey Stokes   DOB:  1961-02-23   MRN:  [id number]   Chief Complaint: Fatigue (Extremely tired. Concerned because his brother has liver cancer and he is nervous he is going to catch cancer. Patient would like blood work today.), Gastroesophageal Reflux (Heartburn frequently.), and Hemorrhoids (Wants to discuss his issue with hemorrhoids.)  Gastroesophageal Reflux He complains of heartburn. He reports no chest pain. This is a recurrent problem. The problem occurs occasionally. Associated symptoms include fatigue. He has tried a PPI (omeprazole every other day) for the symptoms.  Rash This is a recurrent problem. The affected locations include the genitalia. The rash is characterized by redness, itchiness and burning. He was exposed to nothing. Associated symptoms include fatigue. Pertinent negatives include no fever or shortness of breath.  Hemorrhoids - external lesions with some itching and burning.  It is not hard or tender.  He wants to have it addressed.  Lab Results  Component Value Date   NA 140 05/25/2022   K 4.2 05/25/2022   CO2 28 05/25/2022   GLUCOSE 106 (H) 05/25/2022   BUN 12 05/25/2022   CREATININE 0.95 05/25/2022   CALCIUM 9.3 05/25/2022   EGFR 82 06/30/2021   GFRNONAA >60 05/25/2022   Lab Results  Component Value Date   CHOL 202 (H) 06/30/2021   HDL 47 06/30/2021   LDLCALC 134 (H) 06/30/2021   TRIG 114 06/30/2021   CHOLHDL 4.3 06/30/2021   Lab Results  Component Value Date   TSH 1.440 10/26/2015   Lab Results  Component Value Date   HGBA1C 5.5 05/25/2022   Lab Results  Component Value Date   WBC 8.3 05/25/2022   HGB 15.5 05/25/2022   HCT 44.7 05/25/2022   MCV 97.2 05/25/2022   PLT 219 05/25/2022   Lab Results  Component Value Date   ALT 36 12/09/2021   AST 24 12/09/2021   ALKPHOS 78 12/09/2021   BILITOT 0.5 12/09/2021   No results found for: "25OHVITD2", "25OHVITD3", "VD25OH"   Review of Systems  Constitutional:   Positive for fatigue. Negative for chills, diaphoresis and fever.  Respiratory:  Negative for chest tightness and shortness of breath.   Cardiovascular:  Negative for chest pain, palpitations and leg swelling.  Gastrointestinal:  Positive for heartburn.  Skin:  Positive for rash.  Neurological:  Negative for dizziness and headaches.  Psychiatric/Behavioral:  Negative for dysphoric mood and sleep disturbance. The patient is not nervous/anxious.     Patient Active Problem List   Diagnosis Date Noted   Blood glucose elevated 04/08/2022   Pulmonary nodules/lesions, multiple 12/17/2021   Mixed hyperlipidemia 06/30/2021   BPH with obstruction/lower urinary tract symptoms 06/30/2021   Bilateral carotid artery disease, unspecified type 06/30/2021   History of colonic polyps    Polyp of transverse colon    Gastroesophageal reflux disease 03/14/2017   Ulnar neuritis, left 04/20/2016   Complex regional pain syndrome type 2 of left upper extremity 02/12/2016   Rhinitis, allergic 07/29/2015   LPRD (laryngopharyngeal reflux disease) 03/31/2015   Panic disorder without agoraphobia 03/31/2015   History of stroke 12/03/2014   HSV-2 infection 12/03/2014   Obstructive apnea 12/03/2014   Disorder of rotator cuff 12/03/2014   Mild atherosclerosis of carotid artery, bilateral     Allergies  Allergen Reactions   Hydrocodone Nausea Only and Anxiety   Lyrica [Pregabalin] Other (See Comments)    "drunk feeling" "felt like I was going to die"  Morphine Other (See Comments)    Not effective     Past Surgical History:  Procedure Laterality Date   COLONOSCOPY WITH PROPOFOL N/A 06/01/2015   Procedure: COLONOSCOPY WITH PROPOFOL;  Surgeon: Midge Miniumarren Wohl, MD;  [facility];  Service: Endoscopy;  Laterality: N/A;  ASCENDING COLON POLYP DESCENDING COLON POLYP TRANSVERSE COLON POLYP   COLONOSCOPY WITH PROPOFOL N/A 02/19/2021   Procedure: COLONOSCOPY WITH PROPOFOL;  Surgeon: Midge MiniumWohl, Darren, MD;   [facility];  Service: Endoscopy;  Laterality: N/A;   EYE SURGERY     Age 62    POLYPECTOMY  02/19/2021   Procedure: POLYPECTOMY;  Surgeon: Midge MiniumWohl, Darren, MD;  [facility];  Service: Endoscopy;;   REVERSE SHOULDER ARTHROPLASTY Right 05/26/2022   Procedure: REVERSE SHOULDER ARTHROPLASTY;  Surgeon: Jones Broomhandler, Justin, MD;  Location: [facility];  Service: Orthopedics;  Laterality: Right;   SALIVARY GLAND SURGERY     SHOULDER ARTHROSCOPY Left 03/2014   bicep and nerve problems still   TONSILLECTOMY     UMBILICAL HERNIA REPAIR N/A 08/25/2015   Procedure: HERNIA REPAIR UMBILICAL ADULT;  Surgeon: Leafy Roiego F Pabon, MD;  Location: [facility];  Service: General;  Laterality: N/A;    Social History   Tobacco Use   Smoking status: Former    Packs/day: 2.00    Years: 33.00    Additional pack years: 0.00    Total pack years: 66.00    Types: Cigarettes    Start date: 06/28/1971    Quit date: 12/25/2005    Years since quitting: 16.7   Smokeless tobacco: Former    Types: Associate ProfessorChew  Vaping Use   Vaping Use: Never used  Substance Use Topics   Alcohol use: No    Alcohol/week: 0.0 standard drinks of alcohol   Drug use: Never     Medication list has been reviewed and updated.  Current Meds  Medication Sig   acetaminophen (TYLENOL) 650 MG CR tablet Take 1,300 mg by mouth as needed for pain (headache).   albuterol (VENTOLIN HFA) 108 (90 Base) MCG/ACT inhaler Inhale 2 puffs into the lungs every 6 (six) hours as needed for wheezing or shortness of breath.   aspirin EC 81 MG tablet Take 81 mg by mouth at bedtime.   clotrimazole-betamethasone (LOTRISONE) cream Apply 1 application. topically daily. To shaft of penis and under the scrotum (Patient taking differently: Apply 1 application  topically as needed (rash). To shaft of penis and under the scrotum)   fluconazole (DIFLUCAN) 100 MG tablet Take 1 tablet (100 mg total) by mouth daily for 3 days.   Magnesium 300 MG CAPS Take 300 mg  by mouth every other day.   Multiple Vitamins-Minerals (MULTIVITAMIN ADULT PO) Take 1 tablet by mouth daily. When able to remember   omeprazole (PRILOSEC) 20 MG capsule Take 20 mg by mouth every other day.   potassium chloride (KLOR-CON) 20 MEQ packet Take 20 mEq by mouth every other day.   tamsulosin (FLOMAX) 0.4 MG CAPS capsule Take 1 capsule (0.4 mg total) by mouth at bedtime.   valACYclovir (VALTREX) 500 MG tablet Take 500 mg by mouth 2 (two) times daily.   zinc gluconate 50 MG tablet Take 50 mg by mouth every other day.       10/06/2022   10:17 AM 04/08/2022   11:49 AM 12/17/2021   10:59 AM 11/30/2021    2:51 PM  GAD 7 : Generalized Anxiety Score  Nervous, Anxious, on Edge 3 0 0 2  Control/stop worrying  1 0 0 1  Worry too much - different things 1 3 0 1  Trouble relaxing 0 0 0 0  Restless 0 0 0 0  Easily annoyed or irritable 1 0 0 1  Afraid - awful might happen 0 0 0 0  Total GAD 7 Score 6 3 0 5  Anxiety Difficulty Somewhat difficult Not difficult at all Not difficult at all Not difficult at all       10/06/2022   10:17 AM 04/08/2022   11:48 AM 03/09/2022    8:20 AM  Depression screen PHQ 2/9  Decreased Interest 0 0 0  Down, Depressed, Hopeless 0 0 1  PHQ - 2 Score 0 0 1  Altered sleeping 3 0 0  Tired, decreased energy 3 0 0  Change in appetite 3 0 0  Feeling bad or failure about yourself  0 0 0  Trouble concentrating 0 0 0  Moving slowly or fidgety/restless 0 0 0  Suicidal thoughts 0 0 0  PHQ-9 Score 9 0 1  Difficult doing work/chores Not difficult at all Not difficult at all Not difficult at all      10/06/2022   10:13 AM  Results of the Epworth flowsheet  Sitting and reading 0  Watching TV 0  Sitting, inactive in a public place (e.g. a theatre or a meeting) 1  As a passenger in a car for an hour without a break 0  Lying down to rest in the afternoon when circumstances permit 3  Sitting and talking to someone 0  Sitting quietly after a lunch without alcohol 3   In a car, while stopped for a few minutes in traffic 0  Total score 7     BP Readings from Last 3 Encounters:  10/06/22 128/78  05/26/22 129/79  05/25/22 112/67    Physical Exam Vitals and nursing note reviewed.  Constitutional:      General: He is not in acute distress.    Appearance: Normal appearance. He is well-developed.  HENT:     Head: Normocephalic and atraumatic.  Neck:     Vascular: No carotid bruit.  Cardiovascular:     Rate and Rhythm: Normal rate and regular rhythm.     Heart sounds: No murmur heard. Pulmonary:     Effort: Pulmonary effort is normal. No respiratory distress.     Breath sounds: No wheezing or rhonchi.  Abdominal:     General: Abdomen is flat. There is no distension.     Palpations: Abdomen is soft. There is no mass.     Tenderness: There is abdominal tenderness.     Hernia: No hernia is present.  Genitourinary:    Comments: Rectal exam deferred Musculoskeletal:     Cervical back: Normal range of motion.     Right lower leg: No edema.     Left lower leg: No edema.     Comments: Excellent ROM right shoulder s/p reverse shoulder arthroplasty  Lymphadenopathy:     Cervical: No cervical adenopathy.  Skin:    General: Skin is warm and dry.     Capillary Refill: Capillary refill takes less than 2 seconds.     Findings: No rash.  Neurological:     Mental Status: He is alert and oriented to person, place, and time. Mental status is at baseline.  Psychiatric:        Mood and Affect: Mood normal.        Behavior: Behavior normal.     Wt Readings  from Last 3 Encounters:  10/06/22 217 lb (98.4 kg)  05/26/22 206 lb (93.4 kg)  05/25/22 206 lb (93.4 kg)    BP 128/78   Pulse 91   Ht 6' (1.829 m)   Wt 217 lb (98.4 kg)   SpO2 98%   BMI 29.43 kg/m   Assessment and Plan:  Problem List Items Addressed This Visit       Digestive   Gastroesophageal reflux disease (Chronic)    Worsening gerd over the past month No change in diet or  medications Taking omeprazole qod Will increase to qd and check H Pylori      Relevant Orders   CBC with Differential/Platelet   Comprehensive metabolic panel   H. pylori breath test   Other Visit Diagnoses     Candidal balanitis    -  Primary   continue topical anti-fungal/steroid cream   Relevant Medications   valACYclovir (VALTREX) 500 MG tablet   fluconazole (DIFLUCAN) 100 MG tablet   External hemorrhoids       not bleeding or painful but protruding and uncomfortable will refer to GI for possible banding   Relevant Orders   Ambulatory referral to Gastroenterology       No follow-ups on file.   Partially dictated using Dragon software, any errors are not intentional.  Reubin Milan, MD [facility] [place], [place]

## 2022-10-06 NOTE — Assessment & Plan Note (Signed)
Worsening gerd over the past month No change in diet or medications Taking omeprazole qod Will increase to qd and check H Pylori

## 2022-10-08 LAB — CBC WITH DIFFERENTIAL/PLATELET
Basophils Absolute: 0 10*3/uL (ref 0.0–0.2)
Basos: 0 %
EOS (ABSOLUTE): 0.1 10*3/uL (ref 0.0–0.4)
Eos: 1 %
Hematocrit: 43.9 % (ref 37.5–51.0)
Hemoglobin: 15.8 g/dL (ref 13.0–17.7)
Immature Grans (Abs): 0 10*3/uL (ref 0.0–0.1)
Immature Granulocytes: 0 %
Lymphocytes Absolute: 1.9 10*3/uL (ref 0.7–3.1)
Lymphs: 27 %
MCH: 33.5 pg — ABNORMAL HIGH (ref 26.6–33.0)
MCHC: 36 g/dL — ABNORMAL HIGH (ref 31.5–35.7)
MCV: 93 fL (ref 79–97)
Monocytes Absolute: 0.9 10*3/uL (ref 0.1–0.9)
Monocytes: 12 %
Neutrophils Absolute: 4.2 10*3/uL (ref 1.4–7.0)
Neutrophils: 60 %
Platelets: 244 10*3/uL (ref 150–450)
RBC: 4.72 x10E6/uL (ref 4.14–5.80)
RDW: 13.1 % (ref 11.6–15.4)
WBC: 7.2 10*3/uL (ref 3.4–10.8)

## 2022-10-08 LAB — COMPREHENSIVE METABOLIC PANEL
ALT: 28 IU/L (ref 0–44)
AST: 25 IU/L (ref 0–40)
Albumin/Globulin Ratio: 2.1 (ref 1.2–2.2)
Albumin: 4.6 g/dL (ref 3.8–4.9)
Alkaline Phosphatase: 104 IU/L (ref 44–121)
BUN/Creatinine Ratio: 10 (ref 9–20)
BUN: 9 mg/dL (ref 6–24)
Bilirubin Total: 0.5 mg/dL (ref 0.0–1.2)
CO2: 20 mmol/L (ref 20–29)
Calcium: 9.1 mg/dL (ref 8.7–10.2)
Chloride: 104 mmol/L (ref 96–106)
Creatinine, Ser: 0.93 mg/dL (ref 0.76–1.27)
Globulin, Total: 2.2 g/dL (ref 1.5–4.5)
Glucose: 99 mg/dL (ref 70–99)
Potassium: 4.6 mmol/L (ref 3.5–5.2)
Sodium: 140 mmol/L (ref 134–144)
Total Protein: 6.8 g/dL (ref 6.0–8.5)
eGFR: 95 mL/min/{1.73_m2} (ref >59)

## 2022-10-08 LAB — H. PYLORI BREATH TEST: H pylori Breath Test: NEGATIVE

## 2022-10-18 ENCOUNTER — Ambulatory Visit: Payer: Medicare HMO | Admitting: Internal Medicine

## 2022-10-23 ENCOUNTER — Other Ambulatory Visit: Payer: Self-pay | Admitting: Internal Medicine

## 2022-10-23 DIAGNOSIS — N138 Other obstructive and reflux uropathy: Secondary | ICD-10-CM

## 2022-11-07 DIAGNOSIS — S46011D Strain of muscle(s) and tendon(s) of the rotator cuff of right shoulder, subsequent encounter: Secondary | ICD-10-CM | POA: Diagnosis not present

## 2022-11-14 ENCOUNTER — Ambulatory Visit (INDEPENDENT_AMBULATORY_CARE_PROVIDER_SITE_OTHER): Payer: Medicare HMO | Admitting: Internal Medicine

## 2022-11-14 ENCOUNTER — Encounter: Payer: Self-pay | Admitting: Internal Medicine

## 2022-11-14 VITALS — BP 110/72 | HR 64 | Ht 72.0 in | Wt 208.0 lb

## 2022-11-14 DIAGNOSIS — N401 Enlarged prostate with lower urinary tract symptoms: Secondary | ICD-10-CM | POA: Diagnosis not present

## 2022-11-14 DIAGNOSIS — I779 Disorder of arteries and arterioles, unspecified: Secondary | ICD-10-CM | POA: Diagnosis not present

## 2022-11-14 DIAGNOSIS — Z23 Encounter for immunization: Secondary | ICD-10-CM | POA: Diagnosis not present

## 2022-11-14 DIAGNOSIS — N138 Other obstructive and reflux uropathy: Secondary | ICD-10-CM

## 2022-11-14 DIAGNOSIS — K219 Gastro-esophageal reflux disease without esophagitis: Secondary | ICD-10-CM | POA: Diagnosis not present

## 2022-11-14 DIAGNOSIS — I6523 Occlusion and stenosis of bilateral carotid arteries: Secondary | ICD-10-CM

## 2022-11-14 DIAGNOSIS — Z0000 Encounter for general adult medical examination without abnormal findings: Secondary | ICD-10-CM

## 2022-11-14 MED ORDER — TAMSULOSIN HCL 0.4 MG PO CAPS: 0.4000 mg | ORAL_CAPSULE | Freq: Every day | ORAL | 3 refills | Status: DC

## 2022-11-14 MED ORDER — ATORVASTATIN CALCIUM 10 MG PO TABS: 10.0000 mg | ORAL_TABLET | Freq: Every day | ORAL | 1 refills | Status: DC

## 2022-11-14 NOTE — Assessment & Plan Note (Signed)
Symptoms fairly well controlled on Flomax.

## 2022-11-14 NOTE — Progress Notes (Signed)
Date:  11/14/2022   Name:  Jeffrey Stokes   DOB:  1960-07-25   MRN:  [id number]   Chief Complaint: Annual Exam Jeffrey Stokes is a 62 y.o. male who presents today for his Complete Annual Exam. He feels fairly well. He reports exercising walking. He reports he is sleeping fairly well.   Colonoscopy: 01/2021 repeat 7 yrs  Immunization History  Administered Date(s) Administered   Influenza,inj,Quad PF,6+ Mos 03/14/2017, 05/04/2018, 06/30/2021   Influenza-Unspecified 03/27/2015   Tdap 11/30/2021   Health Maintenance Due  Topic Date Due   Pneumococcal Vaccine 65-17 Years old (1 of 2 - PCV) Never done    Lab Results  Component Value Date   PSA1 0.8 11/30/2021   PSA1 0.7 06/30/2021   PSA1 0.5 06/18/2020   Hyperlipidemia This is a chronic problem. The problem is uncontrolled. Recent lipid tests were reviewed and are high. Pertinent negatives include no chest pain, myalgias or shortness of breath. Current antihyperlipidemic treatment includes diet change.  Benign Prostatic Hypertrophy This is a chronic problem. The problem is unchanged. Irritative symptoms do not include frequency. Pertinent negatives include no chills or dysuria. Past treatments include tamsulosin.    Lab Results  Component Value Date   NA 140 10/06/2022   K 4.6 10/06/2022   CO2 20 10/06/2022   GLUCOSE 99 10/06/2022   BUN 9 10/06/2022   CREATININE 0.93 10/06/2022   CALCIUM 9.1 10/06/2022   EGFR 95 10/06/2022   GFRNONAA >60 05/25/2022   Lab Results  Component Value Date   CHOL 202 (H) 06/30/2021   HDL 47 06/30/2021   LDLCALC 134 (H) 06/30/2021   TRIG 114 06/30/2021   CHOLHDL 4.3 06/30/2021   Lab Results  Component Value Date   TSH 1.440 10/26/2015   Lab Results  Component Value Date   HGBA1C 5.5 05/25/2022   Lab Results  Component Value Date   WBC 7.2 10/06/2022   HGB 15.8 10/06/2022   HCT 43.9 10/06/2022   MCV 93 10/06/2022   PLT 244 10/06/2022   Lab Results  Component Value Date   ALT 28  10/06/2022   AST 25 10/06/2022   ALKPHOS 104 10/06/2022   BILITOT 0.5 10/06/2022   No results found for: "25OHVITD2", "25OHVITD3", "VD25OH"   Review of Systems  Constitutional:  Negative for appetite change, chills, diaphoresis, fatigue and unexpected weight change.  HENT:  Negative for hearing loss, tinnitus, trouble swallowing and voice change.   Eyes:  Negative for visual disturbance.  Respiratory:  Negative for choking, shortness of breath and wheezing.   Cardiovascular:  Negative for chest pain, palpitations and leg swelling.  Gastrointestinal:  Negative for abdominal pain, blood in stool, constipation and diarrhea.  Genitourinary:  Negative for difficulty urinating, dysuria and frequency.  Musculoskeletal:  Negative for arthralgias, back pain and myalgias.  Skin:  Positive for rash (candida balanitis). Negative for color change.  Neurological:  Negative for dizziness, syncope and headaches.  Hematological:  Negative for adenopathy.  Psychiatric/Behavioral:  Negative for dysphoric mood and sleep disturbance. The patient is not nervous/anxious.     Patient Active Problem List   Diagnosis Date Noted   Blood glucose elevated 04/08/2022   Pulmonary nodules/lesions, multiple 12/17/2021   Mixed hyperlipidemia 06/30/2021   BPH with obstruction/lower urinary tract symptoms 06/30/2021   Bilateral carotid artery disease, unspecified type (HCC) 06/30/2021   History of colonic polyps    Polyp of transverse colon    Gastroesophageal reflux disease 03/14/2017   Ulnar neuritis,  left 04/20/2016   Complex regional pain syndrome type 2 of left upper extremity 02/12/2016   Rhinitis, allergic 07/29/2015   LPRD (laryngopharyngeal reflux disease) 03/31/2015   Panic disorder without agoraphobia 03/31/2015   History of stroke 12/03/2014   HSV-2 infection 12/03/2014   Obstructive apnea 12/03/2014   Disorder of rotator cuff 12/03/2014   Mild atherosclerosis of carotid artery, bilateral      Allergies  Allergen Reactions   Hydrocodone Nausea Only and Anxiety   Lyrica [Pregabalin] Other (See Comments)    "drunk feeling" "felt like I was going to die"    Morphine Other (See Comments)    Not effective     Past Surgical History:  Procedure Laterality Date   COLONOSCOPY WITH PROPOFOL N/A 06/01/2015   Procedure: COLONOSCOPY WITH PROPOFOL;  Surgeon: Midge Minium, MD;  Location: [facility];  Service: Endoscopy;  Laterality: N/A;  ASCENDING COLON POLYP DESCENDING COLON POLYP TRANSVERSE COLON POLYP   COLONOSCOPY WITH PROPOFOL N/A 02/19/2021   Procedure: COLONOSCOPY WITH PROPOFOL;  Surgeon: Midge Minium, MD;  Location: [facility];  Service: Endoscopy;  Laterality: N/A;   EYE SURGERY     Age 8    POLYPECTOMY  02/19/2021   Procedure: POLYPECTOMY;  Surgeon: Midge Minium, MD;  Location: [facility];  Service: Endoscopy;;   REVERSE SHOULDER ARTHROPLASTY Right 05/26/2022   Procedure: REVERSE SHOULDER ARTHROPLASTY;  Surgeon: Jones Broom, MD;  Location: [facility];  Service: Orthopedics;  Laterality: Right;   SALIVARY GLAND SURGERY     SHOULDER ARTHROSCOPY Left 03/2014   bicep and nerve problems still   TONSILLECTOMY     UMBILICAL HERNIA REPAIR N/A 08/25/2015   Procedure: HERNIA REPAIR UMBILICAL ADULT;  Surgeon: Leafy Ro, MD;  Location: [facility];  Service: General;  Laterality: N/A;    Social History   Tobacco Use   Smoking status: Former    Packs/day: 2.00    Years: 33.00    Additional pack years: 0.00    Total pack years: 66.00    Types: Cigarettes    Start date: 06/28/1971    Quit date: 12/25/2005    Years since quitting: 16.8   Smokeless tobacco: Former    Types: Associate Professor Use: Never used  Substance Use Topics   Alcohol use: No    Alcohol/week: 0.0 standard drinks of alcohol   Drug use: Never     Medication list has been reviewed and updated.  Current Meds  Medication Sig   acetaminophen (TYLENOL) 650 MG CR tablet  Take 1,300 mg by mouth as needed for pain (headache).   aspirin EC 81 MG tablet Take 81 mg by mouth at bedtime.   atorvastatin (LIPITOR) 10 MG tablet Take 1 tablet (10 mg total) by mouth daily.   clotrimazole-betamethasone (LOTRISONE) cream Apply 1 application. topically daily. To shaft of penis and under the scrotum (Patient taking differently: Apply 1 application  topically as needed (rash). To shaft of penis and under the scrotum)   Magnesium 300 MG CAPS Take 300 mg by mouth every other day.   Multiple Vitamins-Minerals (MULTIVITAMIN ADULT PO) Take 1 tablet by mouth daily. When able to remember   omeprazole (PRILOSEC) 20 MG capsule Take 20 mg by mouth every other day.   potassium chloride (KLOR-CON) 20 MEQ packet Take 20 mEq by mouth every other day.   valACYclovir (VALTREX) 500 MG tablet Take 500 mg by mouth 2 (two) times daily.   zinc gluconate 50 MG tablet Take  50 mg by mouth every other day.   [DISCONTINUED] albuterol (VENTOLIN HFA) 108 (90 Base) MCG/ACT inhaler Inhale 2 puffs into the lungs every 6 (six) hours as needed for wheezing or shortness of breath.   [DISCONTINUED] tamsulosin (FLOMAX) 0.4 MG CAPS capsule Take 1 capsule by mouth at bedtime       10/06/2022   10:17 AM 04/08/2022   11:49 AM 12/17/2021   10:59 AM 11/30/2021    2:51 PM  GAD 7 : Generalized Anxiety Score  Nervous, Anxious, on Edge 3 0 0 2  Control/stop worrying 1 0 0 1  Worry too much - different things 1 3 0 1  Trouble relaxing 0 0 0 0  Restless 0 0 0 0  Easily annoyed or irritable 1 0 0 1  Afraid - awful might happen 0 0 0 0  Total GAD 7 Score 6 3 0 5  Anxiety Difficulty Somewhat difficult Not difficult at all Not difficult at all Not difficult at all       10/06/2022   10:17 AM 04/08/2022   11:48 AM 03/09/2022    8:20 AM  Depression screen PHQ 2/9  Decreased Interest 0 0 0  Down, Depressed, Hopeless 0 0 1  PHQ - 2 Score 0 0 1  Altered sleeping 3 0 0  Tired, decreased energy 3 0 0  Change in appetite  3 0 0  Feeling bad or failure about yourself  0 0 0  Trouble concentrating 0 0 0  Moving slowly or fidgety/restless 0 0 0  Suicidal thoughts 0 0 0  PHQ-9 Score 9 0 1  Difficult doing work/chores Not difficult at all Not difficult at all Not difficult at all    BP Readings from Last 3 Encounters:  11/14/22 110/72  10/06/22 128/78  05/26/22 129/79    Physical Exam Vitals and nursing note reviewed.  Constitutional:      Appearance: Normal appearance. He is well-developed.  HENT:     Head: Normocephalic.     Right Ear: Tympanic membrane, ear canal and external ear normal.     Left Ear: Tympanic membrane, ear canal and external ear normal.     Nose: Nose normal.  Eyes:     Conjunctiva/sclera: Conjunctivae normal.     Pupils: Pupils are equal, round, and reactive to light.  Neck:     Thyroid: No thyromegaly.     Vascular: No carotid bruit.  Cardiovascular:     Rate and Rhythm: Normal rate and regular rhythm.     Pulses: Normal pulses.     Heart sounds: Normal heart sounds.  Pulmonary:     Effort: Pulmonary effort is normal.     Breath sounds: Normal breath sounds. No wheezing.  Chest:  Breasts:    Right: No mass.     Left: No mass.  Abdominal:     General: Bowel sounds are normal.     Palpations: Abdomen is soft.     Tenderness: There is no abdominal tenderness.  Musculoskeletal:        General: Normal range of motion.     Cervical back: Normal range of motion and neck supple.     Right lower leg: No edema.     Left lower leg: No edema.  Lymphadenopathy:     Cervical: No cervical adenopathy.  Skin:    General: Skin is warm and dry.     Capillary Refill: Capillary refill takes less than 2 seconds.  Neurological:     General:  No focal deficit present.     Mental Status: He is alert and oriented to person, place, and time.     Deep Tendon Reflexes: Reflexes are normal and symmetric.  Psychiatric:        Attention and Perception: Attention normal.        Mood and  Affect: Mood normal.        Thought Content: Thought content normal.     Wt Readings from Last 3 Encounters:  11/14/22 208 lb (94.3 kg)  10/06/22 217 lb (98.4 kg)  05/26/22 206 lb (93.4 kg)    BP 110/72   Pulse 64   Ht 6' (1.829 m)   Wt 208 lb (94.3 kg)   SpO2 97%   BMI 28.21 kg/m   Assessment and Plan:  Problem List Items Addressed This Visit     Mild atherosclerosis of carotid artery, bilateral (Chronic)    Also atherosclerosis of the coronary arteries and aorta on CT lung cancer screening last year. Should be on a statin medication.      Relevant Medications   atorvastatin (LIPITOR) 10 MG tablet   Other Relevant Orders   Comprehensive metabolic panel   Lipid panel   TSH   LPRD (laryngopharyngeal reflux disease) (Chronic)    Reflux symptoms are minimal on current therapy - omeprazole. No red flag signs such as weight loss, n/v, melena       Relevant Orders   CBC with Differential/Platelet   BPH with obstruction/lower urinary tract symptoms (Chronic)    Symptoms fairly well controlled on Flomax.      Relevant Medications   tamsulosin (FLOMAX) 0.4 MG CAPS capsule   Other Relevant Orders   PSA   Bilateral carotid artery disease, unspecified type (HCC) (Chronic)    Recommend starting statin therapy based on hx of CVA and atherosclerosis seen on CT last fall.      Relevant Medications   atorvastatin (LIPITOR) 10 MG tablet   Other Visit Diagnoses     Annual physical exam    -  Primary   Need for vaccination for pneumococcus       declined       Return in about 4 months (around 03/17/2023) for lipids.   Partially dictated using Dragon software, any errors are not intentional.  Reubin Milan, MD [facility] [place], [place]

## 2022-11-14 NOTE — Assessment & Plan Note (Signed)
Recommend starting statin therapy based on hx of CVA and atherosclerosis seen on CT last fall.

## 2022-11-14 NOTE — Assessment & Plan Note (Signed)
Reflux symptoms are minimal on current therapy - omeprazole. No red flag signs such as weight loss, n/v, melena  

## 2022-11-14 NOTE — Assessment & Plan Note (Signed)
Also atherosclerosis of the coronary arteries and aorta on CT lung cancer screening last year. Should be on a statin medication.

## 2022-11-17 DIAGNOSIS — N401 Enlarged prostate with lower urinary tract symptoms: Secondary | ICD-10-CM | POA: Diagnosis not present

## 2022-11-17 DIAGNOSIS — K219 Gastro-esophageal reflux disease without esophagitis: Secondary | ICD-10-CM | POA: Diagnosis not present

## 2022-11-17 DIAGNOSIS — N138 Other obstructive and reflux uropathy: Secondary | ICD-10-CM | POA: Diagnosis not present

## 2022-11-17 DIAGNOSIS — I6523 Occlusion and stenosis of bilateral carotid arteries: Secondary | ICD-10-CM | POA: Diagnosis not present

## 2022-11-18 LAB — LIPID PANEL
Chol/HDL Ratio: 2.9 ratio (ref 0.0–5.0)
Cholesterol, Total: 140 mg/dL (ref 100–199)
HDL: 49 mg/dL (ref >39)
LDL Chol Calc (NIH): 78 mg/dL (ref 0–99)
Triglycerides: 66 mg/dL (ref 0–149)
VLDL Cholesterol Cal: 13 mg/dL (ref 5–40)

## 2022-11-18 LAB — COMPREHENSIVE METABOLIC PANEL
ALT: 38 IU/L (ref 0–44)
AST: 42 IU/L — ABNORMAL HIGH (ref 0–40)
Albumin/Globulin Ratio: 1.9 (ref 1.2–2.2)
Albumin: 4.5 g/dL (ref 3.8–4.9)
Alkaline Phosphatase: 85 IU/L (ref 44–121)
BUN/Creatinine Ratio: 11 (ref 9–20)
BUN: 11 mg/dL (ref 6–24)
Bilirubin Total: 0.8 mg/dL (ref 0.0–1.2)
CO2: 19 mmol/L — ABNORMAL LOW (ref 20–29)
Calcium: 9.3 mg/dL (ref 8.7–10.2)
Chloride: 107 mmol/L — ABNORMAL HIGH (ref 96–106)
Creatinine, Ser: 1 mg/dL (ref 0.76–1.27)
Globulin, Total: 2.4 g/dL (ref 1.5–4.5)
Glucose: 99 mg/dL (ref 70–99)
Potassium: 4.1 mmol/L (ref 3.5–5.2)
Sodium: 143 mmol/L (ref 134–144)
Total Protein: 6.9 g/dL (ref 6.0–8.5)
eGFR: 87 mL/min/{1.73_m2} (ref >59)

## 2022-11-18 LAB — PSA: Prostate Specific Ag, Serum: 0.6 ng/mL (ref 0.0–4.0)

## 2022-11-18 LAB — CBC WITH DIFFERENTIAL/PLATELET
Basophils Absolute: 0 10*3/uL (ref 0.0–0.2)
Basos: 0 %
EOS (ABSOLUTE): 0.1 10*3/uL (ref 0.0–0.4)
Eos: 1 %
Hematocrit: 42.6 % (ref 37.5–51.0)
Hemoglobin: 14.7 g/dL (ref 13.0–17.7)
Immature Grans (Abs): 0 10*3/uL (ref 0.0–0.1)
Immature Granulocytes: 0 %
Lymphocytes Absolute: 2.2 10*3/uL (ref 0.7–3.1)
Lymphs: 20 %
MCH: 33.2 pg — ABNORMAL HIGH (ref 26.6–33.0)
MCHC: 34.5 g/dL (ref 31.5–35.7)
MCV: 96 fL (ref 79–97)
Monocytes Absolute: 1.1 10*3/uL — ABNORMAL HIGH (ref 0.1–0.9)
Monocytes: 10 %
Neutrophils Absolute: 7.4 10*3/uL — ABNORMAL HIGH (ref 1.4–7.0)
Neutrophils: 69 %
Platelets: 240 10*3/uL (ref 150–450)
RBC: 4.43 x10E6/uL (ref 4.14–5.80)
RDW: 14.1 % (ref 11.6–15.4)
WBC: 10.9 10*3/uL — ABNORMAL HIGH (ref 3.4–10.8)

## 2022-11-18 LAB — TSH: TSH: 2.27 u[IU]/mL (ref 0.450–4.500)

## 2022-12-08 ENCOUNTER — Other Ambulatory Visit: Payer: Self-pay

## 2022-12-08 ENCOUNTER — Other Ambulatory Visit: Payer: Self-pay | Admitting: Internal Medicine

## 2022-12-08 MED ORDER — VALACYCLOVIR HCL 500 MG PO TABS
500.0000 mg | ORAL_TABLET | Freq: Two times a day (BID) | ORAL | 0 refills | Status: DC
  Filled 2022-12-08 – 2022-12-13 (×2): qty 60, 30d supply, fill #0

## 2022-12-13 ENCOUNTER — Other Ambulatory Visit: Payer: Self-pay

## 2022-12-14 ENCOUNTER — Other Ambulatory Visit: Payer: Self-pay | Admitting: Internal Medicine

## 2022-12-14 DIAGNOSIS — B009 Herpesviral infection, unspecified: Secondary | ICD-10-CM

## 2022-12-14 MED ORDER — VALACYCLOVIR HCL 500 MG PO TABS: 500.0000 mg | ORAL_TABLET | Freq: Two times a day (BID) | ORAL | 5 refills | Status: AC

## 2023-01-02 DIAGNOSIS — M722 Plantar fascial fibromatosis: Secondary | ICD-10-CM | POA: Diagnosis not present

## 2023-01-11 ENCOUNTER — Ambulatory Visit: Payer: Medicare HMO | Admitting: Podiatry

## 2023-01-30 DIAGNOSIS — M722 Plantar fascial fibromatosis: Secondary | ICD-10-CM | POA: Diagnosis not present

## 2023-01-31 ENCOUNTER — Encounter: Payer: Self-pay | Admitting: Gastroenterology

## 2023-01-31 ENCOUNTER — Ambulatory Visit: Payer: Medicare HMO | Admitting: Gastroenterology

## 2023-01-31 VITALS — BP 131/71 | HR 60 | Temp 98.0°F | Ht 72.0 in | Wt 198.1 lb

## 2023-01-31 DIAGNOSIS — K644 Residual hemorrhoidal skin tags: Secondary | ICD-10-CM | POA: Diagnosis not present

## 2023-01-31 NOTE — Progress Notes (Signed)
Arlyss Repress, MD [address]  [address]  [place], [place] [address]  Main: [phone]  Fax: [phone]    Gastroenterology Consultation  Referring Provider:     Reubin Milan, MD Primary Care Physician:  Reubin Milan, MD Primary Gastroenterologist:  Dr. Arlyss Repress Reason for Consultation: Perianal lesion        HPI:   LIEUTENANT PALERMO is a 62 y.o. male referred by Dr. Judithann Graves, Nyoka Cowden, MD  for consultation & management of perianal lesion.  Patient reports a palpable soft tissue at the anal opening which is not reducible.  He denies any GI symptoms including constipation or diarrhea.  Spends about 20 minutes in the toilet, on phone playing sudoku.  He denies any other hemorrhoidal symptoms including pressure, swelling, pain, discomfort, bleeding or itching or prolapse.  He admits to drinking 2-3 bottles of water 500 mL and 12 cups of coffee with creamer.  He also drink 2% milk about 3 gallons per week He denies any other GI symptoms  NSAIDs: None  Antiplts/Anticoagulants/Anti thrombotics: None  GI Procedures: Colonoscopy in 2022 Diminutive polyp was removed, pathology showed lymphoid aggregate only Hemorrhoids were found  Past Medical History:  Diagnosis Date   Allergy    Anxiety    Benign neoplasm of ascending colon    Benign neoplasm of descending colon    Benign neoplasm of transverse colon    Depression    GERD (gastroesophageal reflux disease)    History of motion sickness    laying on back   HOH (hard of hearing)    bilateral   Hx-TIA (transient ischemic attack) 2012   Sleep apnea    had one sleep study, inconclusive,needs repeat   Stroke (HCC)    3 strokes last one on 2012/ mouth tilt/ weakness right hand side   Wears dentures    full upper and lower    Past Surgical History:  Procedure Laterality Date   COLONOSCOPY WITH PROPOFOL N/A 06/01/2015   Procedure: COLONOSCOPY WITH PROPOFOL;  Surgeon: Midge Minium, MD;  Location: [facility];  Service: Endoscopy;  Laterality: N/A;  ASCENDING COLON POLYP DESCENDING COLON POLYP TRANSVERSE COLON POLYP   COLONOSCOPY WITH PROPOFOL N/A 02/19/2021   Procedure: COLONOSCOPY WITH PROPOFOL;  Surgeon: Midge Minium, MD;  Location: [facility] SUR[facility];  Service: Endoscopy;  Laterality: N/A;   EYE SURGERY     Age 9    POLYPECTOMY  02/19/2021   Procedure: POLYPECTOMY;  Surgeon: Midge Minium, MD;  Location: [facility];  Service: Endoscopy;;   REVERSE SHOULDER ARTHROPLASTY Right 05/26/2022   Procedure: REVERSE SHOULDER ARTHROPLASTY;  Surgeon: Jones Broom, MD;  Location: [facility];  Service: Orthopedics;  Laterality: Right;   SALIVARY GLAND SURGERY     SHOULDER ARTHROSCOPY Left 03/2014   bicep and nerve problems still   TONSILLECTOMY     UMBILICAL HERNIA REPAIR N/A 08/25/2015   Procedure: HERNIA REPAIR UMBILICAL ADULT;  Surgeon: Leafy Ro, MD;  Location: [facility] ORS;  Service: General;  Laterality: N/A;     Current Outpatient Medications:    aspirin EC 81 MG tablet, Take 81 mg by mouth at bedtime., Disp: , Rfl:    atorvastatin (LIPITOR) 10 MG tablet, Take 1 tablet (10 mg total) by mouth daily., Disp: 90 tablet, Rfl: 1   clotrimazole-betamethasone (LOTRISONE) cream, Apply 1 application. topically daily. To shaft of penis and under the scrotum (Patient taking differently: Apply 1 application  topically as needed (rash). To  shaft of penis and under the scrotum), Disp: 30 g, Rfl: 0   Magnesium 300 MG CAPS, Take 300 mg by mouth every other day., Disp: , Rfl:    meloxicam (MOBIC) 15 MG tablet, Take 15 mg by mouth daily., Disp: , Rfl:    Multiple Vitamins-Minerals (MULTIVITAMIN ADULT PO), Take 1 tablet by mouth daily. When able to remember, Disp: , Rfl:    omeprazole (PRILOSEC) 20 MG capsule, Take 20 mg by mouth every other day., Disp: , Rfl:    potassium chloride (KLOR-CON) 20 MEQ packet, Take 20 mEq by mouth every other day., Disp: , Rfl:    valACYclovir (VALTREX) 500 MG  tablet, Take 1 tablet (500 mg total) by mouth 2 (two) times daily., Disp: 60 tablet, Rfl: 5   zinc gluconate 50 MG tablet, Take 50 mg by mouth every other day., Disp: , Rfl:    Family History  Problem Relation Age of Onset   Osteoarthritis Father    Stroke Father      Social History   Tobacco Use   Smoking status: Former    Current packs/day: 0.00    Average packs/day: 2.0 packs/day for 34.5 years (69.0 ttl pk-yrs)    Types: Cigarettes    Start date: 06/28/1971    Quit date: 12/25/2005    Years since quitting: 17.1   Smokeless tobacco: Former    Types: Engineer, drilling   Vaping status: Never Used  Substance Use Topics   Alcohol use: No    Alcohol/week: 0.0 standard drinks of alcohol   Drug use: Never    Allergies as of 01/31/2023 - Review Complete 01/31/2023  Allergen Reaction Noted   Hydrocodone Nausea Only and Anxiety 04/15/2014   Lyrica [pregabalin] Other (See Comments) 05/26/2015   Morphine Other (See Comments) 05/25/2022    Review of Systems:    All systems reviewed and negative except where noted in HPI.   Physical Exam:  BP 131/71 (BP Location: Left Arm, Patient Position: Sitting, Cuff Size: Normal)   Pulse 60   Temp 98 F (36.7 C) (Oral)   Ht 6' (1.829 m)   Wt 198 lb 2 oz (89.9 kg)   BMI 26.87 kg/m  No LMP for male patient.  General:   Alert,  Well-developed, well-nourished, pleasant and cooperative in NAD Head:  Normocephalic and atraumatic. Eyes:  Sclera clear, no icterus.   Conjunctiva pink. Ears:  Normal auditory acuity. Nose:  No deformity, discharge, or lesions. Mouth:  No deformity or lesions,oropharynx pink & moist. Neck:  Supple; no masses or thyromegaly. Lungs:  Respirations even and unlabored.  Clear throughout to auscultation.   No wheezes, crackles, or rhonchi. No acute distress. Heart:  Regular rate and rhythm; no murmurs, clicks, rubs, or gallops. Abdomen:  Normal bowel sounds. Soft, non-tender and non-distended without masses,  hepatosplenomegaly or hernias noted.  No guarding or rebound tenderness.   Rectal: Small, soft perianal skin tag, nontender Msk:  Symmetrical without gross deformities. Good, equal movement & strength bilaterally. Pulses:  Normal pulses noted. Extremities:  No clubbing or edema.  No cyanosis. Neurologic:  Alert and oriented x3;  grossly normal neurologically. Skin:  Intact without significant lesions or rashes. No jaundice. Psych:  Alert and cooperative. Normal mood and affect.  Imaging Studies: None  Assessment and Plan:   TYRIKE GREULICH is a 62 y.o. male with no significant past medical history seen in consultation for perineal skin tag.  Reassured patient that he will not need any treatment for  it, does not need to be removed Discussed about proper toilet hygiene He does not need hemorrhoid ligation  Follow up as needed   Arlyss Repress, MD

## 2023-03-10 ENCOUNTER — Ambulatory Visit (INDEPENDENT_AMBULATORY_CARE_PROVIDER_SITE_OTHER): Payer: Medicare HMO | Admitting: Internal Medicine

## 2023-03-10 ENCOUNTER — Encounter: Payer: Self-pay | Admitting: Internal Medicine

## 2023-03-10 VITALS — BP 118/76 | HR 64 | Ht 72.0 in | Wt 199.0 lb

## 2023-03-10 DIAGNOSIS — R079 Chest pain, unspecified: Secondary | ICD-10-CM | POA: Diagnosis not present

## 2023-03-10 DIAGNOSIS — E782 Mixed hyperlipidemia: Secondary | ICD-10-CM | POA: Diagnosis not present

## 2023-03-10 DIAGNOSIS — R1032 Left lower quadrant pain: Secondary | ICD-10-CM | POA: Insufficient documentation

## 2023-03-10 DIAGNOSIS — I2510 Atherosclerotic heart disease of native coronary artery without angina pectoris: Secondary | ICD-10-CM | POA: Insufficient documentation

## 2023-03-10 NOTE — Assessment & Plan Note (Signed)
Ongoing for undetermined period of time. No bulge or mass noted Worse with activity Recommend General surgery consult

## 2023-03-10 NOTE — Assessment & Plan Note (Signed)
Now taking atorvastatin and working on diet and weight control Will check labs

## 2023-03-10 NOTE — Progress Notes (Signed)
Date:  03/10/2023   Name:  Jeffrey Stokes   DOB:  Mar 05, 1961   MRN:  [id number]   Chief Complaint: Hyperlipidemia  Hyperlipidemia This is a chronic problem. This is a new diagnosis. Recent lipid tests were reviewed and are normal. Associated symptoms include chest pain. Pertinent negatives include no focal weakness, myalgias or shortness of breath. Current antihyperlipidemic treatment includes statins, diet change and exercise.  Groin Pain This is a recurrent problem. The current episode started more than 1 month ago. The problem occurs intermittently. Associated symptoms include chest pain. Pertinent negatives include no abdominal pain, constipation, coughing, diarrhea, headaches or shortness of breath.  Chest Pain  This is a recurrent problem. The current episode started more than 1 month ago. The problem occurs intermittently. The problem has been unchanged. Pain location: across the bilateral upper chest. The pain is mild. The quality of the pain is described as dull. The pain does not radiate. Pertinent negatives include no abdominal pain, cough, dizziness, headaches, palpitations, shortness of breath or weakness.  His past medical history is significant for hyperlipidemia.    Lab Results  Component Value Date   NA 143 11/17/2022   K 4.1 11/17/2022   CO2 19 (L) 11/17/2022   GLUCOSE 99 11/17/2022   BUN 11 11/17/2022   CREATININE 1.00 11/17/2022   CALCIUM 9.3 11/17/2022   EGFR 87 11/17/2022   GFRNONAA >60 05/25/2022   Lab Results  Component Value Date   CHOL 140 11/17/2022   HDL 49 11/17/2022   LDLCALC 78 11/17/2022   TRIG 66 11/17/2022   CHOLHDL 2.9 11/17/2022   Lab Results  Component Value Date   TSH 2.270 11/17/2022   Lab Results  Component Value Date   HGBA1C 5.5 05/25/2022   Lab Results  Component Value Date   WBC 10.9 (H) 11/17/2022   HGB 14.7 11/17/2022   HCT 42.6 11/17/2022   MCV 96 11/17/2022   PLT 240 11/17/2022   Lab Results  Component Value Date    ALT 38 11/17/2022   AST 42 (H) 11/17/2022   ALKPHOS 85 11/17/2022   BILITOT 0.8 11/17/2022   No results found for: "25OHVITD2", "25OHVITD3", "VD25OH"   Review of Systems  Constitutional:  Negative for fatigue and unexpected weight change.  HENT:  Negative for nosebleeds.   Eyes:  Negative for visual disturbance.  Respiratory:  Negative for cough, chest tightness, shortness of breath and wheezing.   Cardiovascular:  Positive for chest pain. Negative for palpitations and leg swelling.  Gastrointestinal:  Negative for abdominal pain, constipation and diarrhea.  Genitourinary:        Left groin pain  Musculoskeletal:  Negative for myalgias.  Neurological:  Negative for dizziness, focal weakness, weakness, light-headedness and headaches.    Patient Active Problem List   Diagnosis Date Noted   Chest pain 03/10/2023   Left groin pain 03/10/2023   Blood glucose elevated 04/08/2022   Pulmonary nodules/lesions, multiple 12/17/2021   Mixed hyperlipidemia 06/30/2021   BPH with obstruction/lower urinary tract symptoms 06/30/2021   Bilateral carotid artery disease, unspecified type (HCC) 06/30/2021   History of colonic polyps    Polyp of transverse colon    Gastroesophageal reflux disease 03/14/2017   Ulnar neuritis, left 04/20/2016   Complex regional pain syndrome type 2 of left upper extremity 02/12/2016   Rhinitis, allergic 07/29/2015   LPRD (laryngopharyngeal reflux disease) 03/31/2015   Panic disorder without agoraphobia 03/31/2015   History of stroke 12/03/2014   HSV-2 infection 12/03/2014  Obstructive apnea 12/03/2014   Disorder of rotator cuff 12/03/2014   Mild atherosclerosis of carotid artery, bilateral     Allergies  Allergen Reactions   Hydrocodone Nausea Only and Anxiety   Lyrica [Pregabalin] Other (See Comments)    "drunk feeling" "felt like I was going to die"    Morphine Other (See Comments)    Not effective     Past Surgical History:  Procedure Laterality  Date   COLONOSCOPY WITH PROPOFOL N/A 06/01/2015   Procedure: COLONOSCOPY WITH PROPOFOL;  Surgeon: Midge Minium, MD;  Location: [facility];  Service: Endoscopy;  Laterality: N/A;  ASCENDING COLON POLYP DESCENDING COLON POLYP TRANSVERSE COLON POLYP   COLONOSCOPY WITH PROPOFOL N/A 02/19/2021   Procedure: COLONOSCOPY WITH PROPOFOL;  Surgeon: Midge Minium, MD;  Location: [facility];  Service: Endoscopy;  Laterality: N/A;   EYE SURGERY     Age 82    POLYPECTOMY  02/19/2021   Procedure: POLYPECTOMY;  Surgeon: Midge Minium, MD;  Location: [facility];  Service: Endoscopy;;   REVERSE SHOULDER ARTHROPLASTY Right 05/26/2022   Procedure: REVERSE SHOULDER ARTHROPLASTY;  Surgeon: Jones Broom, MD;  Location: [facility];  Service: Orthopedics;  Laterality: Right;   SALIVARY GLAND SURGERY     SHOULDER ARTHROSCOPY Left 03/2014   bicep and nerve problems still   TONSILLECTOMY     UMBILICAL HERNIA REPAIR N/A 08/25/2015   Procedure: HERNIA REPAIR UMBILICAL ADULT;  Surgeon: Leafy Ro, MD;  Location: [facility] ORS;  Service: General;  Laterality: N/A;    Social History   Tobacco Use   Smoking status: Former    Current packs/day: 0.00    Average packs/day: 2.0 packs/day for 34.5 years (69.0 ttl pk-yrs)    Types: Cigarettes    Start date: 06/28/1971    Quit date: 12/25/2005    Years since quitting: 17.2   Smokeless tobacco: Former    Types: Engineer, drilling   Vaping status: Never Used  Substance Use Topics   Alcohol use: No    Alcohol/week: 0.0 standard drinks of alcohol   Drug use: Never     Medication list has been reviewed and updated.  Current Meds  Medication Sig   aspirin EC 81 MG tablet Take 81 mg by mouth at bedtime.   atorvastatin (LIPITOR) 10 MG tablet Take 1 tablet (10 mg total) by mouth daily.   clotrimazole-betamethasone (LOTRISONE) cream Apply 1 application. topically daily. To shaft of penis and under the scrotum (Patient taking differently: Apply 1 application   topically as needed (rash). To shaft of penis and under the scrotum)   Magnesium 300 MG CAPS Take 300 mg by mouth every other day.   meloxicam (MOBIC) 15 MG tablet Take 15 mg by mouth daily.   Multiple Vitamins-Minerals (MULTIVITAMIN ADULT PO) Take 1 tablet by mouth daily. When able to remember   omeprazole (PRILOSEC) 20 MG capsule Take 20 mg by mouth every other day.   potassium chloride (KLOR-CON) 20 MEQ packet Take 20 mEq by mouth every other day.   tamsulosin (FLOMAX) 0.4 MG CAPS capsule Take 0.4 mg by mouth at bedtime.   valACYclovir (VALTREX) 500 MG tablet Take 1 tablet (500 mg total) by mouth 2 (two) times daily.   zinc gluconate 50 MG tablet Take 50 mg by mouth every other day.       03/10/2023   10:10 AM 10/06/2022   10:17 AM 04/08/2022   11:49 AM 12/17/2021   10:59 AM  GAD 7 : Generalized Anxiety  Score  Nervous, Anxious, on Edge 1 3 0 0  Control/stop worrying 1 1 0 0  Worry too much - different things 1 1 3  0  Trouble relaxing 0 0 0 0  Restless 1 0 0 0  Easily annoyed or irritable 1 1 0 0  Afraid - awful might happen 1 0 0 0  Total GAD 7 Score 6 6 3  0  Anxiety Difficulty Not difficult at all Somewhat difficult Not difficult at all Not difficult at all       03/10/2023   10:05 AM 10/06/2022   10:17 AM 04/08/2022   11:48 AM  Depression screen PHQ 2/9  Decreased Interest 0 0 0  Down, Depressed, Hopeless 1 0 0  PHQ - 2 Score 1 0 0  Altered sleeping 1 3 0  Tired, decreased energy 0 3 0  Change in appetite 0 3 0  Feeling bad or failure about yourself  1 0 0  Trouble concentrating 0 0 0  Moving slowly or fidgety/restless 0 0 0  Suicidal thoughts 0 0 0  PHQ-9 Score 3 9 0  Difficult doing work/chores Not difficult at all Not difficult at all Not difficult at all    BP Readings from Last 3 Encounters:  03/10/23 118/76  01/31/23 131/71  11/14/22 110/72    Physical Exam Vitals and nursing note reviewed.  Constitutional:      General: He is not in acute distress.     Appearance: Normal appearance. He is well-developed.  HENT:     Head: Normocephalic and atraumatic.  Neck:     Vascular: No carotid bruit.  Cardiovascular:     Rate and Rhythm: Normal rate and regular rhythm.     Pulses: Normal pulses.     Heart sounds: No murmur heard. Pulmonary:     Effort: Pulmonary effort is normal. No respiratory distress.     Breath sounds: No wheezing or rhonchi.  Abdominal:     Hernia: A hernia is present. Hernia is present in the left inguinal area (tenderness, minimal mass).  Musculoskeletal:     Cervical back: Normal range of motion.     Right lower leg: No edema.     Left lower leg: No edema.  Lymphadenopathy:     Cervical: No cervical adenopathy.  Skin:    General: Skin is warm and dry.     Findings: No rash.  Neurological:     Mental Status: He is alert and oriented to person, place, and time.  Psychiatric:        Mood and Affect: Mood normal.        Behavior: Behavior normal.     Wt Readings from Last 3 Encounters:  03/10/23 199 lb (90.3 kg)  01/31/23 198 lb 2 oz (89.9 kg)  11/14/22 208 lb (94.3 kg)    BP 118/76   Pulse 64   Ht 6' (1.829 m)   Wt 199 lb (90.3 kg)   SpO2 98%   BMI 26.99 kg/m   Assessment and Plan:  Problem List Items Addressed This Visit       Unprioritized   Mixed hyperlipidemia - Primary (Chronic)    Now taking atorvastatin and working on diet and weight control Will check labs      Relevant Orders   Comprehensive metabolic panel   Lipid panel   Left groin pain    Ongoing for undetermined period of time. No bulge or mass noted Worse with activity Recommend General surgery consult  Relevant Orders   Ambulatory referral to General Surgery   Chest pain    Non specific chest discomfort without shortness of breath, jaw or arm pain. Unclear if related to exertion - he has not changed his activity level CT last year did show 3 vessel coronary artery calcification Will refer to Cardiology He is on  ASA and statin      Relevant Orders   Ambulatory referral to Cardiology    No follow-ups on file.    Reubin Milan, MD [facility] [place]

## 2023-03-10 NOTE — Assessment & Plan Note (Addendum)
Non specific chest discomfort without shortness of breath, jaw or arm pain. Unclear if related to exertion - he has not changed his activity level CT last year did show 3 vessel coronary artery calcification Will refer to Cardiology He is on ASA and statin

## 2023-03-11 LAB — COMPREHENSIVE METABOLIC PANEL
ALT: 28 IU/L (ref 0–44)
AST: 23 IU/L (ref 0–40)
Albumin: 4.8 g/dL (ref 3.8–4.9)
Alkaline Phosphatase: 87 IU/L (ref 44–121)
BUN/Creatinine Ratio: 13 (ref 9–20)
BUN: 13 mg/dL (ref 6–24)
Bilirubin Total: 0.7 mg/dL (ref 0.0–1.2)
CO2: 22 mmol/L (ref 20–29)
Calcium: 9.7 mg/dL (ref 8.7–10.2)
Chloride: 106 mmol/L (ref 96–106)
Creatinine, Ser: 0.97 mg/dL (ref 0.76–1.27)
Globulin, Total: 2.2 g/dL (ref 1.5–4.5)
Glucose: 99 mg/dL (ref 70–99)
Potassium: 4.5 mmol/L (ref 3.5–5.2)
Sodium: 141 mmol/L (ref 134–144)
Total Protein: 7 g/dL (ref 6.0–8.5)
eGFR: 90 mL/min/{1.73_m2} (ref >59)

## 2023-03-11 LAB — LIPID PANEL
Chol/HDL Ratio: 2.4 ratio (ref 0.0–5.0)
Cholesterol, Total: 122 mg/dL (ref 100–199)
HDL: 51 mg/dL (ref >39)
LDL Chol Calc (NIH): 57 mg/dL (ref 0–99)
Triglycerides: 68 mg/dL (ref 0–149)
VLDL Cholesterol Cal: 14 mg/dL (ref 5–40)

## 2023-03-13 ENCOUNTER — Other Ambulatory Visit: Payer: Self-pay | Admitting: Internal Medicine

## 2023-03-13 DIAGNOSIS — R1032 Left lower quadrant pain: Secondary | ICD-10-CM

## 2023-03-15 ENCOUNTER — Ambulatory Visit (INDEPENDENT_AMBULATORY_CARE_PROVIDER_SITE_OTHER): Payer: Medicare HMO

## 2023-03-15 DIAGNOSIS — Z0000 Encounter for general adult medical examination without abnormal findings: Secondary | ICD-10-CM | POA: Diagnosis not present

## 2023-03-15 NOTE — Patient Instructions (Addendum)
Jeffrey Stokes , Thank you for taking time to come for your Medicare Wellness Visit. I appreciate your ongoing commitment to your health goals. Please review the following plan we discussed and let me know if I can assist you in the future.   Referrals/Orders/Follow-Ups/Clinician Recommendations: none  This is a list of the screening recommended for you and due dates:  Health Maintenance  Topic Date Due   Zoster (Shingles) Vaccine (1 of 2) Never done   Flu Shot  09/25/2023*   Medicare Annual Wellness Visit  03/14/2024   Colon Cancer Screening  02/20/2028   DTaP/Tdap/Td vaccine (2 - Td or Tdap) 12/01/2031   Hepatitis C Screening  Addressed   HIV Screening  Addressed   Pneumococcal Vaccination  Aged Out   HPV Vaccine  Aged Out   COVID-19 Vaccine  Discontinued  *Topic was postponed. The date shown is not the original due date.    Advanced directives: (ACP Link)Information on Advanced Care Planning can be found at [facility] Directives Advance Health Care Directives ([email or web address]   Next Medicare Annual Wellness Visit scheduled for next year: Yes  03/20/24 @ 8:15 am by video

## 2023-03-15 NOTE — Progress Notes (Signed)
Subjective:   Jeffrey Stokes is a 62 y.o. male who presents for Medicare Annual/Subsequent preventive examination.  Visit Complete: Virtual  I connected with  Jeanice Lim on 03/15/23 by a audio enabled telemedicine application and verified that I am speaking with the correct person using two identifiers.  Patient Location: Home  Provider Location: Office/Clinic  I discussed the limitations of evaluation and management by telemedicine. The patient expressed understanding and agreed to proceed.  Vital Signs: Unable to obtain new vitals due to this being a telehealth visit.  Cardiac Risk Factors include: advanced age (>83men, >64 women);dyslipidemia;male gender     Objective:    Today's Vitals   03/15/23 0846  PainSc: 4    There is no height or weight on file to calculate BMI.     03/15/2023    8:52 AM 05/25/2022   10:01 AM 03/09/2022    8:21 AM 03/01/2021    8:59 AM 02/19/2021    9:37 AM 02/03/2021   10:36 AM 07/02/2016    7:50 AM  Advanced Directives  Does Patient Have a Medical Advance Directive? No Yes No;Yes No No No No  Type of Advance Directive  Living will;Healthcare Power of Attorney Living will      Would patient like information on creating a medical advance directive? No - Patient declined  No - Patient declined No - Patient declined No - Patient declined      Current Medications (verified) Outpatient Encounter Medications as of 03/15/2023  Medication Sig   aspirin EC 81 MG tablet Take 81 mg by mouth at bedtime.   atorvastatin (LIPITOR) 10 MG tablet Take 1 tablet (10 mg total) by mouth daily.   clotrimazole-betamethasone (LOTRISONE) cream Apply 1 application. topically daily. To shaft of penis and under the scrotum (Patient taking differently: Apply 1 application  topically as needed (rash). To shaft of penis and under the scrotum)   Magnesium 300 MG CAPS Take 300 mg by mouth every other day.   meloxicam (MOBIC) 15 MG tablet Take 15 mg by mouth daily.   Multiple  Vitamins-Minerals (MULTIVITAMIN ADULT PO) Take 1 tablet by mouth daily. When able to remember   omeprazole (PRILOSEC) 20 MG capsule Take 20 mg by mouth every other day.   potassium chloride (KLOR-CON) 20 MEQ packet Take 20 mEq by mouth every other day.   tamsulosin (FLOMAX) 0.4 MG CAPS capsule Take 0.4 mg by mouth at bedtime.   valACYclovir (VALTREX) 500 MG tablet Take 1 tablet (500 mg total) by mouth 2 (two) times daily.   zinc gluconate 50 MG tablet Take 50 mg by mouth every other day.   No facility-administered encounter medications on file as of 03/15/2023.    Allergies (verified) Hydrocodone, Lyrica [pregabalin], and Morphine   History: Past Medical History:  Diagnosis Date   Allergy    Anxiety    Benign neoplasm of ascending colon    Benign neoplasm of descending colon    Benign neoplasm of transverse colon    Depression    GERD (gastroesophageal reflux disease)    History of motion sickness    laying on back   HOH (hard of hearing)    bilateral   Hx-TIA (transient ischemic attack) 2012   Sleep apnea    had one sleep study, inconclusive,needs repeat   Stroke (HCC)    3 strokes last one on 2012/ mouth tilt/ weakness right hand side   Wears dentures    full upper and lower   Past  Surgical History:  Procedure Laterality Date   COLONOSCOPY WITH PROPOFOL N/A 06/01/2015   Procedure: COLONOSCOPY WITH PROPOFOL;  Surgeon: Midge Minium, MD;  Location: [facility];  Service: Endoscopy;  Laterality: N/A;  ASCENDING COLON POLYP DESCENDING COLON POLYP TRANSVERSE COLON POLYP   COLONOSCOPY WITH PROPOFOL N/A 02/19/2021   Procedure: COLONOSCOPY WITH PROPOFOL;  Surgeon: Midge Minium, MD;  Location: [facility];  Service: Endoscopy;  Laterality: N/A;   EYE SURGERY     Age 21    POLYPECTOMY  02/19/2021   Procedure: POLYPECTOMY;  Surgeon: Midge Minium, MD;  Location: [facility];  Service: Endoscopy;;   REVERSE SHOULDER ARTHROPLASTY Right 05/26/2022   Procedure:  REVERSE SHOULDER ARTHROPLASTY;  Surgeon: Jones Broom, MD;  Location: [facility];  Service: Orthopedics;  Laterality: Right;   SALIVARY GLAND SURGERY     SHOULDER ARTHROSCOPY Left 03/2014   bicep and nerve problems still   TONSILLECTOMY     UMBILICAL HERNIA REPAIR N/A 08/25/2015   Procedure: HERNIA REPAIR UMBILICAL ADULT;  Surgeon: Leafy Ro, MD;  Location: [facility];  Service: General;  Laterality: N/A;   Family History  Problem Relation Age of Onset   Osteoarthritis Father    Stroke Father    Social History   Socioeconomic History   Marital status: Married    Spouse name: Steward Drone   Number of children: 1   Years of education: 9   Highest education level: GED or equivalent  Occupational History   Occupation: Works for [employer]    Comment: Disability  Tobacco Use   Smoking status: Former    Current packs/day: 0.00    Average packs/day: 2.0 packs/day for 34.5 years (69.0 ttl pk-yrs)    Types: Cigarettes    Start date: 06/28/1971    Quit date: 12/25/2005    Years since quitting: 17.2   Smokeless tobacco: Former    Types: Associate Professor status: Never Used  Substance and Sexual Activity   Alcohol use: No    Alcohol/week: 0.0 standard drinks of alcohol   Drug use: Never   Sexual activity: Yes    Birth control/protection: None  Other Topics Concern   Not on file  Social History Narrative   Not on file   Social Determinants of Health   Financial Resource Strain: Low Risk  (03/15/2023)   Overall Financial Resource Strain (CARDIA)    Difficulty of Paying Living Expenses: Not very hard  Food Insecurity: No Food Insecurity (03/15/2023)   Hunger Vital Sign    Worried About Running Out of Food in the Last Year: Never true    Ran Out of Food in the Last Year: Never true  Transportation Needs: No Transportation Needs (03/15/2023)   PRAPARE - Administrator, Civil Service (Medical): No    Lack of Transportation (Non-Medical): No  Physical  Activity: Sufficiently Active (03/15/2023)   Exercise Vital Sign    Days of Exercise per Week: 4 days    Minutes of Exercise per Session: 40 min  Stress: No Stress Concern Present (03/15/2023)   [employer] of Occupational Health - Occupational Stress Questionnaire    Feeling of Stress : Only a little  Social Connections: Moderately Integrated (03/15/2023)   Social Connection and Isolation Panel [NHANES]    Frequency of Communication with Friends and Family: More than three times a week    Frequency of Social Gatherings with Friends and Family: Twice a week    Attends Religious Services:  More than 4 times per year    Active Member of Clubs or Organizations: No    Attends Banker Meetings: Never    Marital Status: Married    Tobacco Counseling Counseling given: Not Answered   Clinical Intake:  Pre-visit preparation completed: Yes  Pain : 0-10 Pain Score: 4  Pain Type: Acute pain Pain Location: Chest Pain Descriptors / Indicators: Squeezing Pain Onset: More than a month ago Pain Frequency: Intermittent     Nutritional Status: BMI 25 -29 Overweight Nutritional Risks: None Diabetes: No  How often do you need to have someone help you when you read instructions, pamphlets, or other written materials from your doctor or pharmacy?: 1 - Never  Interpreter Needed?: No  Information entered by :: Kennedy Bucker, LPN   Activities of Daily Living    03/15/2023    8:53 AM 05/25/2022   10:03 AM  In your present state of health, do you have any difficulty performing the following activities:  Hearing? 0   Vision? 1   Difficulty concentrating or making decisions? 0   Walking or climbing stairs? 0   Dressing or bathing? 0   Doing errands, shopping? 0 0  Preparing Food and eating ? N   Using the Toilet? N   In the past six months, have you accidently leaked urine? N   Do you have problems with loss of bowel control? N   Managing your Medications? N   Managing  your Finances? N   Housekeeping or managing your Housekeeping? N     Patient Care Team: Reubin Milan, MD as PCP - General (Internal Medicine) Midge Minium, MD as Consulting Physician (Gastroenterology)  Indicate any recent Medical Services you may have received from other than [facility] providers in the past year (date may be approximate).     Assessment:   This is a routine wellness examination for [place].  Hearing/Vision screen Hearing Screening - Comments:: No aids Vision Screening - Comments:: Wears glasses- [facility]   Goals Addressed             This Visit's Progress    DIET - EAT MORE FRUITS AND VEGETABLES         Depression Screen    03/15/2023    8:50 AM 03/10/2023   10:05 AM 10/06/2022   10:17 AM 04/08/2022   11:48 AM 03/09/2022    8:20 AM 12/17/2021   10:59 AM 11/30/2021    2:50 PM  PHQ 2/9 Scores  PHQ - 2 Score 0 1 0 0 1 0 0  PHQ- 9 Score 0 3 9 0 1 2 4     Fall Risk    03/15/2023    8:53 AM 03/10/2023   10:10 AM 10/06/2022   10:17 AM 04/08/2022   11:49 AM 03/09/2022    8:22 AM  Fall Risk   Falls in the past year? 0 0 0 0 0  Number falls in past yr: 0 0 0 0 0  Injury with Fall? 0 0 0 0 0  Risk for fall due to : No Fall Risks No Fall Risks No Fall Risks No Fall Risks No Fall Risks  Follow up Falls prevention discussed;Falls evaluation completed Falls evaluation completed Falls evaluation completed Falls evaluation completed Falls evaluation completed    MEDICARE RISK AT HOME: Medicare Risk at Home Any stairs in or around the home?: No If so, are there any without handrails?: No Home free of loose throw rugs in walkways, pet beds, electrical  cords, etc?: Yes Adequate lighting in your home to reduce risk of falls?: Yes Life alert?: No Use of a cane, walker or w/c?: No Grab bars in the bathroom?: Yes Shower chair or bench in shower?: Yes Elevated toilet seat or a handicapped toilet?: Yes  TIMED UP AND GO:  Was the test performed?  No    Cognitive  Function:        03/15/2023    8:54 AM 03/09/2022    8:23 AM 03/01/2021    9:10 AM  6CIT Screen  What Year? 0 points  0 points  What month? 0 points 0 points 0 points  What time? 0 points 0 points 0 points  Count back from 20 0 points 0 points 0 points  Months in reverse 0 points 2 points 0 points  Repeat phrase 0 points 0 points 0 points  Total Score 0 points  0 points    Immunizations Immunization History  Administered Date(s) Administered   Influenza,inj,Quad PF,6+ Mos 03/14/2017, 05/04/2018, 06/30/2021   Influenza-Unspecified 03/27/2015   Tdap 11/30/2021    TDAP status: Up to date  Flu Vaccine status: Up to date  Pneumococcal vaccine status: Declined,  Education has been provided regarding the importance of this vaccine but patient still declined. Advised may receive this vaccine at local pharmacy or Health Dept. Aware to provide a copy of the vaccination record if obtained from local pharmacy or Health Dept. Verbalized acceptance and understanding.   Covid-19 vaccine status: Declined, Education has been provided regarding the importance of this vaccine but patient still declined. Advised may receive this vaccine at local pharmacy or Health Dept.or vaccine clinic. Aware to provide a copy of the vaccination record if obtained from local pharmacy or Health Dept. Verbalized acceptance and understanding.  Qualifies for Shingles Vaccine? Yes   Zostavax completed No   Shingrix Completed?: No.    Education has been provided regarding the importance of this vaccine. Patient has been advised to call insurance company to determine out of pocket expense if they have not yet received this vaccine. Advised may also receive vaccine at local pharmacy or Health Dept. Verbalized acceptance and understanding.  Screening Tests Health Maintenance  Topic Date Due   Zoster Vaccines- Shingrix (1 of 2) Never done   INFLUENZA VACCINE  09/25/2023 (Originally 01/26/2023)   Medicare Annual Wellness  (AWV)  03/14/2024   Colonoscopy  02/20/2028   DTaP/Tdap/Td (2 - Td or Tdap) 12/01/2031   Hepatitis C Screening  Addressed   HIV Screening  Addressed   Pneumococcal Vaccine 27-23 Years old  Aged Out   HPV VACCINES  Aged Out   COVID-19 Vaccine  Discontinued    Health Maintenance  Health Maintenance Due  Topic Date Due   Zoster Vaccines- Shingrix (1 of 2) Never done    Colorectal cancer screening: Type of screening: Colonoscopy. Completed 02/19/21. Repeat every 7 years  Lung Cancer Screening: (Low Dose CT Chest recommended if Age 79-80 years, 20 pack-year currently smoking OR have quit w/in 15years.) does not qualify.    Additional Screening:  Hepatitis C Screening: does qualify; Completed 12/10/99  Vision Screening: Recommended annual ophthalmology exams for early detection of glaucoma and other disorders of the eye. Is the patient up to date with their annual eye exam?  Yes  Who is the provider or what is the name of the office in which the patient attends annual eye exams? [facility] If pt is not established with a provider, would they like to be referred to  a provider to establish care? No .   Dental Screening: Recommended annual dental exams for proper oral hygiene   Community Resource Referral / Chronic Care Management: CRR required this visit?  No   CCM required this visit?  No     Plan:     I have personally reviewed and noted the following in the patient's chart:   Medical and social history Use of alcohol, tobacco or illicit drugs  Current medications and supplements including opioid prescriptions. Patient is not currently taking opioid prescriptions. Functional ability and status Nutritional status Physical activity Advanced directives List of other physicians Hospitalizations, surgeries, and ER visits in previous 12 months Vitals Screenings to include cognitive, depression, and falls Referrals and appointments  In addition, I have reviewed and  discussed with patient certain preventive protocols, quality metrics, and best practice recommendations. A written personalized care plan for preventive services as well as general preventive health recommendations were provided to patient.     Hal Hope, LPN   [id number]/55/[id number]   After Visit Summary: (MyChart) Due to this being a telephonic visit, the after visit summary with patients personalized plan was offered to patient via MyChart   Nurse Notes: none

## 2023-03-23 ENCOUNTER — Ambulatory Visit: Payer: Medicare HMO | Admitting: Surgery

## 2023-04-06 ENCOUNTER — Encounter: Payer: Self-pay | Admitting: General Surgery

## 2023-04-06 ENCOUNTER — Ambulatory Visit: Payer: Medicare HMO | Admitting: General Surgery

## 2023-04-06 VITALS — BP 150/74 | HR 63 | Temp 97.8°F | Resp 14 | Ht 72.0 in | Wt 199.0 lb

## 2023-04-06 DIAGNOSIS — R1032 Left lower quadrant pain: Secondary | ICD-10-CM | POA: Diagnosis not present

## 2023-04-06 NOTE — Patient Instructions (Signed)
Will get a CT scan and then call with the results.  Will verify if you have a hernia on the right and left side. Once we have the CT will have to wait on Cardiology before we could schedule any surgeries.  We will plan to see you back after Cardiology see you and after CT scan to make the next plans.

## 2023-04-06 NOTE — Progress Notes (Signed)
[facility] History and Physical  Reason for Referral: Left groin hernia  Referring Physician: Reubin Milan, MD   Chief Complaint   Hernia; New Patient (Initial Visit)     Jeffrey Stokes is a 62 y.o. male.  HPI: Jeffrey Stokes is a 62 yo who says for a few months now he has had left groin pain for a while. He was seen by his PCP and a left inguinal hernia was noted. He has never felt a bulge or seen a bulge. He has also been having some right groin pain. He is worried about pain in his right testicle too. He also complained about chest pain at the PCP visit. She has referred him to Cardiology. He sees them in November.    Past Medical History:  Diagnosis Date   Allergy    Anxiety    Benign neoplasm of ascending colon    Benign neoplasm of descending colon    Benign neoplasm of transverse colon    Depression    GERD (gastroesophageal reflux disease)    History of motion sickness    laying on back   HOH (hard of hearing)    bilateral   Hx-TIA (transient ischemic attack) 2012   Sleep apnea    had one sleep study, inconclusive,needs repeat   Stroke (HCC)    3 strokes last one on 2012/ mouth tilt/ weakness right hand side   Wears dentures    full upper and lower    Past Surgical History:  Procedure Laterality Date   COLONOSCOPY WITH PROPOFOL N/A 06/01/2015   Procedure: COLONOSCOPY WITH PROPOFOL;  Surgeon: Midge Minium, MD;  Location: [facility];  Service: Endoscopy;  Laterality: N/A;  ASCENDING COLON POLYP DESCENDING COLON POLYP TRANSVERSE COLON POLYP   COLONOSCOPY WITH PROPOFOL N/A 02/19/2021   Procedure: COLONOSCOPY WITH PROPOFOL;  Surgeon: Midge Minium, MD;  Location: [facility];  Service: Endoscopy;  Laterality: N/A;   EYE SURGERY     Age 62    POLYPECTOMY  02/19/2021   Procedure: POLYPECTOMY;  Surgeon: Midge Minium, MD;  Location: [facility];  Service: Endoscopy;;   REVERSE SHOULDER ARTHROPLASTY Right 05/26/2022   Procedure:  REVERSE SHOULDER ARTHROPLASTY;  Surgeon: Jones Broom, MD;  Location: [facility];  Service: Orthopedics;  Laterality: Right;   SALIVARY GLAND SURGERY     SHOULDER ARTHROSCOPY Left 03/2014   bicep and nerve problems still   TONSILLECTOMY     UMBILICAL HERNIA REPAIR N/A 08/25/2015   Procedure: HERNIA REPAIR UMBILICAL ADULT;  Surgeon: Leafy Ro, MD;  Location: [facility];  Service: General;  Laterality: N/A;    Family History  Problem Relation Age of Onset   Osteoarthritis Father    Stroke Father     Social History   Tobacco Use   Smoking status: Former    Current packs/day: 0.00    Average packs/day: 2.0 packs/day for 34.5 years (69.0 ttl pk-yrs)    Types: Cigarettes    Start date: 06/28/1971    Quit date: 12/25/2005    Years since quitting: 17.2   Smokeless tobacco: Former    Types: Associate Professor status: Never Used  Substance Use Topics   Alcohol use: No    Alcohol/week: 0.0 standard drinks of alcohol   Drug use: Never    Medications: I have reviewed the patient's current medications. Allergies as of 04/06/2023       Reactions   Hydrocodone Nausea Only, Anxiety   Lyrica [  pregabalin] Other (See Comments)   "drunk feeling" "felt like I was going to die"    Morphine Other (See Comments)   Not effective         Medication List        Accurate as of April 06, 2023  2:27 PM. If you have any questions, ask your nurse or doctor.          aspirin EC 81 MG tablet Take 81 mg by mouth at bedtime.   atorvastatin 10 MG tablet Commonly known as: LIPITOR Take 1 tablet (10 mg total) by mouth daily.   clotrimazole-betamethasone cream Commonly known as: LOTRISONE Apply 1 application. topically daily. To shaft of penis and under the scrotum What changed:  when to take this reasons to take this   Magnesium 300 MG Caps Take 300 mg by mouth every other day.   meloxicam 15 MG tablet Commonly known as: MOBIC Take 15 mg by mouth daily.   MULTIVITAMIN  ADULT PO Take 1 tablet by mouth daily. When able to remember   omeprazole 20 MG capsule Commonly known as: PRILOSEC Take 20 mg by mouth every other day.   potassium chloride 20 MEQ packet Commonly known as: KLOR-CON Take 20 mEq by mouth every other day.   tamsulosin 0.4 MG Caps capsule Commonly known as: FLOMAX Take 0.4 mg by mouth at bedtime.   valACYclovir 500 MG tablet Commonly known as: VALTREX Take 1 tablet (500 mg total) by mouth 2 (two) times daily.   zinc gluconate 50 MG tablet Take 50 mg by mouth every other day.         ROS:  A comprehensive review of systems was negative except for: Gastrointestinal: positive for left groin pain Genitourinary: positive for frequency and right testicular pain  Blood pressure (!) 150/74, pulse 63, temperature 97.8 F (36.6 C), temperature source Oral, resp. rate 14, height 6' (1.829 m), weight 199 lb (90.3 kg), SpO2 96%. Physical Exam Vitals reviewed.  HENT:     Head: Normocephalic.     Nose: Nose normal.  Eyes:     Extraocular Movements: Extraocular movements intact.  Cardiovascular:     Rate and Rhythm: Normal rate and regular rhythm.  Pulmonary:     Effort: Pulmonary effort is normal.     Breath sounds: Normal breath sounds.  Abdominal:     General: There is no distension.     Palpations: Abdomen is soft.     Tenderness: There is no abdominal tenderness.     Hernia: A hernia is present. Hernia is present in the left inguinal area. There is no hernia in the right inguinal area.     Comments: Likely left inguinal hernia, no obvious right hernia   Musculoskeletal:        General: Normal range of motion.  Skin:    General: Skin is warm.  Neurological:     General: No focal deficit present.     Mental Status: He is alert and oriented to person, place, and time.  Psychiatric:        Mood and Affect: Mood normal.        Behavior: Behavior normal.        Thought Content: Thought content normal.     Results: None    Assessment & Plan:  Jeffrey Stokes is a 62 y.o. male with what feels like a left inguinal hernia but also some pain on the right.   Will get a CT scan and then call with  the results.  Will verify if you have a hernia on the right and left side. Once we have the CT will have to wait on Cardiology before we could schedule any surgeries.  We will plan to see you back after Cardiology see you and after CT scan to make the next plans.   Future Appointments  Date Time Provider Department Center  05/01/2023 12:00 PM [facility] 1 [facility] [place] [facility]  05/22/2023  1:40 PM Mallipeddi, Orion Modest, MD [facility] [place] [facility]  03/20/2024  8:15 AM PC[facility]-ANNUAL WELLNESS VISIT [facility]     All questions were answered to the satisfaction of the patient.   Lucretia Roers 04/06/2023, 2:27 PM

## 2023-04-19 DIAGNOSIS — M25511 Pain in right shoulder: Secondary | ICD-10-CM | POA: Diagnosis not present

## 2023-05-01 ENCOUNTER — Ambulatory Visit (HOSPITAL_COMMUNITY): Admission: RE | Admit: 2023-05-01 | Payer: Medicare HMO | Source: Ambulatory Visit

## 2023-05-07 ENCOUNTER — Ambulatory Visit (HOSPITAL_COMMUNITY): Admission: RE | Admit: 2023-05-07 | Payer: Medicare HMO | Source: Ambulatory Visit

## 2023-05-08 ENCOUNTER — Ambulatory Visit (HOSPITAL_COMMUNITY)
Admission: RE | Admit: 2023-05-08 | Discharge: 2023-05-08 | Disposition: A | Discharge status: Home / Self Care | Payer: Medicare HMO | Source: Ambulatory Visit | Attending: General Surgery | Admitting: General Surgery

## 2023-05-08 DIAGNOSIS — R1032 Left lower quadrant pain: Secondary | ICD-10-CM | POA: Insufficient documentation

## 2023-05-08 DIAGNOSIS — K4020 Bilateral inguinal hernia, without obstruction or gangrene, not specified as recurrent: Secondary | ICD-10-CM | POA: Diagnosis not present

## 2023-05-08 DIAGNOSIS — N400 Enlarged prostate without lower urinary tract symptoms: Secondary | ICD-10-CM | POA: Diagnosis not present

## 2023-05-08 DIAGNOSIS — K5730 Diverticulosis of large intestine without perforation or abscess without bleeding: Secondary | ICD-10-CM | POA: Diagnosis not present

## 2023-05-08 MED ORDER — IOHEXOL 300 MG/ML  SOLN
100.0000 mL | Freq: Once | INTRAMUSCULAR | Status: AC | PRN
  Administered 2023-05-08: 100 mL via INTRAVENOUS

## 2023-05-16 NOTE — Progress Notes (Signed)
Let patient know that he has small fat containing hernias but no bowel in the hernias. These may be causing discomfort but may also not be the source of his right scrotal pain. He has appt with Cardiology 11/25 and we should be getting cardiac risk to ensure he would be ok for surgery if he decided he wanted it.

## 2023-05-17 ENCOUNTER — Ambulatory Visit (INDEPENDENT_AMBULATORY_CARE_PROVIDER_SITE_OTHER): Payer: Medicare HMO | Admitting: Internal Medicine

## 2023-05-17 ENCOUNTER — Other Ambulatory Visit: Payer: Self-pay | Admitting: Internal Medicine

## 2023-05-17 ENCOUNTER — Encounter: Payer: Self-pay | Admitting: Internal Medicine

## 2023-05-17 VITALS — BP 124/78 | HR 82 | Ht 72.0 in | Wt 201.0 lb

## 2023-05-17 DIAGNOSIS — N400 Enlarged prostate without lower urinary tract symptoms: Secondary | ICD-10-CM | POA: Diagnosis not present

## 2023-05-17 DIAGNOSIS — K4020 Bilateral inguinal hernia, without obstruction or gangrene, not specified as recurrent: Secondary | ICD-10-CM

## 2023-05-17 DIAGNOSIS — N401 Enlarged prostate with lower urinary tract symptoms: Secondary | ICD-10-CM

## 2023-05-17 DIAGNOSIS — N138 Other obstructive and reflux uropathy: Secondary | ICD-10-CM

## 2023-05-17 DIAGNOSIS — B3742 Candidal balanitis: Secondary | ICD-10-CM

## 2023-05-17 MED ORDER — HYDROCORTISONE 2 % EX CREA: 1.0000 | TOPICAL_CREAM | Freq: Every day | CUTANEOUS | 0 refills | Status: DC

## 2023-05-17 MED ORDER — FLUCONAZOLE 100 MG PO TABS: 100.0000 mg | ORAL_TABLET | ORAL | 0 refills | Status: AC

## 2023-05-17 NOTE — Assessment & Plan Note (Signed)
Urinary symptoms well controlled on Flomax Recent CT showed some enlargement - noted right sided on exam today Will repeat PSA

## 2023-05-17 NOTE — Progress Notes (Signed)
Date:  05/17/2023   Name:  Jeffrey Stokes   DOB:  [birth year]   MRN:  [id number]   Chief Complaint: Results (Patient would like to discuss recent CT Scan Results.) and Rash (Had in the past. Rash comes and goes. Painful. )   FINDINGS: Urinary Tract: Bladder is mildly distended. Grossly preserved contour. Ureteral jets are seen.   Bowel: Visualized bowel in the pelvis is nondilated. There is scattered colonic stool. Normal appendix. Sigmoid colon diverticula.   Vascular/Lymphatic: Scattered vascular calcifications. Normal caliber of the iliac vessels. No specific abnormal lymph node enlargement identified in the pelvis.   Reproductive: Heterogeneous mildly enlarged prostate with some mass effect along the base of the bladder. Please correlate with patient's PSA.   Other: Small fat containing bilateral inguinal hernias, left-greater-than-right. No bowel involvement. No other pelvic wall hernia identified. Slight motion artifact.   Musculoskeletal: Degenerative changes along the pelvis. Pars defects at L5 with grade 1 anterolisthesis. Some disc bulging.   IMPRESSION: Small fat containing inguinal hernias, left-greater-than-right. No bowel containing hernia identified.   Colonic diverticula.   Enlarged prostate  Rash This is a recurrent problem. The affected locations include the genitalia. Pertinent negatives include no diarrhea, fatigue, fever or shortness of breath.  Pelvic Pain This is a chronic problem. The problem occurs constantly. Associated symptoms include a rash. Pertinent negatives include no change in bowel habit, chest pain, chills, fatigue, fever, nausea, urinary symptoms or visual change. The symptoms are aggravated by bending and coughing (CT shows bilat fat containing hernias).    Review of Systems  Constitutional:  Negative for chills, fatigue and fever.  Respiratory:  Negative for chest tightness and shortness of breath.   Cardiovascular:  Negative for  chest pain.  Gastrointestinal:  Negative for change in bowel habit, constipation, diarrhea and nausea.  Genitourinary:  Positive for pelvic pain and penile pain (and rash). Negative for testicular pain.       Groin pain bilaterally and over the symphysis pubis.  Skin:  Positive for rash.     Lab Results  Component Value Date   NA 141 03/10/2023   K 4.5 03/10/2023   CO2 22 03/10/2023   GLUCOSE 99 03/10/2023   BUN 13 03/10/2023   CREATININE 0.97 03/10/2023   CALCIUM 9.7 03/10/2023   EGFR 90 03/10/2023   GFRNONAA >60 05/25/2022   Lab Results  Component Value Date   CHOL 122 03/10/2023   HDL 51 03/10/2023   LDLCALC 57 03/10/2023   TRIG 68 03/10/2023   CHOLHDL 2.4 03/10/2023   Lab Results  Component Value Date   TSH 2.270 11/17/2022   Lab Results  Component Value Date   HGBA1C 5.5 05/25/2022   Lab Results  Component Value Date   WBC 10.9 (H) 11/17/2022   HGB 14.7 11/17/2022   HCT 42.6 11/17/2022   MCV 96 11/17/2022   PLT 240 11/17/2022   Lab Results  Component Value Date   ALT 28 03/10/2023   AST 23 03/10/2023   ALKPHOS 87 03/10/2023   BILITOT 0.7 03/10/2023   No results found for: "25OHVITD2", "25OHVITD3", "VD25OH"   Patient Active Problem List   Diagnosis Date Noted   Chest pain 03/10/2023   Left groin pain 03/10/2023   Blood glucose elevated 04/08/2022   Pulmonary nodules/lesions, multiple 12/17/2021   Mixed hyperlipidemia 06/30/2021   BPH with obstruction/lower urinary tract symptoms 06/30/2021   Bilateral carotid artery disease, unspecified type (HCC) 06/30/2021   History of colonic polyps  Polyp of transverse colon    Gastroesophageal reflux disease 03/14/2017   Ulnar neuritis, left 04/20/2016   Complex regional pain syndrome type 2 of left upper extremity 02/12/2016   Rhinitis, allergic 07/29/2015   LPRD (laryngopharyngeal reflux disease) 03/31/2015   Panic disorder without agoraphobia 03/31/2015   History of stroke 12/03/2014   HSV-2  infection 12/03/2014   Obstructive apnea 12/03/2014   Disorder of rotator cuff 12/03/2014   Mild atherosclerosis of carotid artery, bilateral     Allergies  Allergen Reactions   Hydrocodone Nausea Only and Anxiety   Lyrica [Pregabalin] Other (See Comments)    "drunk feeling" "felt like I was going to die"    Morphine Other (See Comments)    Not effective     Past Surgical History:  Procedure Laterality Date   COLONOSCOPY WITH PROPOFOL N/A 06/01/2015   Procedure: COLONOSCOPY WITH PROPOFOL;  Surgeon: Midge Minium, MD;  Location: [facility];  Service: Endoscopy;  Laterality: N/A;  ASCENDING COLON POLYP DESCENDING COLON POLYP TRANSVERSE COLON POLYP   COLONOSCOPY WITH PROPOFOL N/A 02/19/2021   Procedure: COLONOSCOPY WITH PROPOFOL;  Surgeon: Midge Minium, MD;  Location: [facility];  Service: Endoscopy;  Laterality: N/A;   EYE SURGERY     Age 62    POLYPECTOMY  02/19/2021   Procedure: POLYPECTOMY;  Surgeon: Midge Minium, MD;  Location: [facility];  Service: Endoscopy;;   REVERSE SHOULDER ARTHROPLASTY Right 05/26/2022   Procedure: REVERSE SHOULDER ARTHROPLASTY;  Surgeon: Jones Broom, MD;  Location: [facility];  Service: Orthopedics;  Laterality: Right;   SALIVARY GLAND SURGERY     SHOULDER ARTHROSCOPY Left 03/2014   bicep and nerve problems still   TONSILLECTOMY     UMBILICAL HERNIA REPAIR N/A 08/25/2015   Procedure: HERNIA REPAIR UMBILICAL ADULT;  Surgeon: Leafy Ro, MD;  Location: [facility];  Service: General;  Laterality: N/A;    Social History   Tobacco Use   Smoking status: Former    Current packs/day: 0.00    Average packs/day: 2.0 packs/day for 34.5 years (69.0 ttl pk-yrs)    Types: Cigarettes    Start date: 06/28/1971    Quit date: 12/25/2005    Years since quitting: 17.4   Smokeless tobacco: Former    Types: Engineer, drilling   Vaping status: Never Used  Substance Use Topics   Alcohol use: No    Alcohol/week: 0.0 standard drinks of alcohol    Drug use: Never     Medication list has been reviewed and updated.  Current Meds  Medication Sig   fluconazole (DIFLUCAN) 100 MG tablet Take 1 tablet (100 mg total) by mouth every other day for 3 days.   Hydrocortisone 2 % CREA Apply 1 Application topically daily at 6 (six) AM.       05/17/2023   10:51 AM 03/10/2023   10:10 AM 10/06/2022   10:17 AM 04/08/2022   11:49 AM  GAD 7 : Generalized Anxiety Score  Nervous, Anxious, on Edge 0 1 3 0  Control/stop worrying 0 1 1 0  Worry too much - different things 0 1 1 3   Trouble relaxing 0 0 0 0  Restless 0 1 0 0  Easily annoyed or irritable 0 1 1 0  Afraid - awful might happen 0 1 0 0  Total GAD 7 Score 0 6 6 3   Anxiety Difficulty Not difficult at all Not difficult at all Somewhat difficult Not difficult at all       05/17/2023  10:51 AM 03/15/2023    8:50 AM 03/10/2023   10:05 AM  Depression screen PHQ 2/9  Decreased Interest 0 0 0  Down, Depressed, Hopeless 0 0 1  PHQ - 2 Score 0 0 1  Altered sleeping 0 0 1  Tired, decreased energy 0 0 0  Change in appetite 0 0 0  Feeling bad or failure about yourself  0 0 1  Trouble concentrating 0 0 0  Moving slowly or fidgety/restless 0 0 0  Suicidal thoughts 0 0 0  PHQ-9 Score 0 0 3  Difficult doing work/chores Not difficult at all Not difficult at all Not difficult at all    BP Readings from Last 3 Encounters:  05/17/23 124/78  04/06/23 (!) 150/74  03/10/23 118/76    Physical Exam Vitals and nursing note reviewed.  Constitutional:      General: He is not in acute distress.    Appearance: He is well-developed.  HENT:     Head: Normocephalic and atraumatic.  Cardiovascular:     Rate and Rhythm: Normal rate and regular rhythm.  Pulmonary:     Effort: Pulmonary effort is normal. No respiratory distress.     Breath sounds: No wheezing or rhonchi.  Abdominal:     General: Abdomen is flat.     Palpations: Abdomen is soft. There is no mass.     Tenderness: There is abdominal  tenderness.     Hernia: No hernia is present.  Genitourinary:    Penis: Circumcised. Erythema (circumferential of the distal shaft) present. No discharge or lesions.      Testes: Normal.     Prostate: Enlarged. Not tender.    Skin:    General: Skin is warm and dry.     Findings: No rash.  Neurological:     Mental Status: He is alert and oriented to person, place, and time.  Psychiatric:        Mood and Affect: Mood normal.        Behavior: Behavior normal.     Wt Readings from Last 3 Encounters:  05/17/23 201 lb (91.2 kg)  04/06/23 199 lb (90.3 kg)  03/10/23 199 lb (90.3 kg)    BP 124/78   Pulse 82   Ht 6' (1.829 m)   Wt 201 lb (91.2 kg)   SpO2 98%   BMI 27.26 kg/m   Assessment and Plan:  Problem List Items Addressed This Visit       Unprioritized   BPH with obstruction/lower urinary tract symptoms (Chronic)    Urinary symptoms well controlled on Flomax Recent CT showed some enlargement - noted right sided on exam today Will repeat PSA      Other Visit Diagnoses     Candidal balanitis    -  Primary   Relevant Medications   Hydrocortisone 2 % CREA   fluconazole (DIFLUCAN) 100 MG tablet   Enlarged prostate on rectal examination       Relevant Orders   PSA   Non-recurrent bilateral inguinal hernia without obstruction or gangrene       follow up with General surgery when ready for repair suspect the pubic discomfort is radiated pain       No follow-ups on file.    Reubin Milan, MD [facility] [place]

## 2023-05-18 LAB — PSA: Prostate Specific Ag, Serum: 0.5 ng/mL (ref 0.0–4.0)

## 2023-05-18 NOTE — Telephone Encounter (Signed)
Requested medication (s) are due for refill today:   Requested medication (s) are on the active medication list: Yes  Last refill:  05/17/23  Future visit scheduled:   Notes to clinic:  See pharmacy request.    Requested Prescriptions  Pending Prescriptions Disp Refills   hydrocortisone 2.5 % cream [Pharmacy Med Name: HYDROCORTISONE 2.5% CRM] 28 g 0    Sig: APPLY CREAM TOPICALLY DAILY AT 6AM.     Off-Protocol Failed - 05/17/2023 11:59 AM      Failed - Medication not assigned to a protocol, review manually.      Passed - Valid encounter within last 12 months    Recent Outpatient Visits           Yesterday Candidal balanitis   [place] [facility] at [facility], Nyoka Cowden, MD   2 months ago Mixed hyperlipidemia   [place] [facility] at [facility], Nyoka Cowden, MD   6 months ago Annual physical exam   [facility] at [facility], Nyoka Cowden, MD   7 months ago Candidal balanitis   [place] [facility] at [facility], Nyoka Cowden, MD   1 year ago Complex regional pain syndrome type 2 of left upper extremity   [place] [facility] at [facility], Nyoka Cowden, MD       Future Appointments             In 4 days Mallipeddi, Orion Modest, MD [facility] at [facility] [place] [facility]

## 2023-05-22 ENCOUNTER — Encounter: Payer: Self-pay | Admitting: Internal Medicine

## 2023-05-22 ENCOUNTER — Ambulatory Visit: Payer: Medicare HMO | Attending: Internal Medicine | Admitting: Internal Medicine

## 2023-05-22 VITALS — BP 128/76 | HR 64 | Ht 72.0 in | Wt 202.0 lb

## 2023-05-22 DIAGNOSIS — I2510 Atherosclerotic heart disease of native coronary artery without angina pectoris: Secondary | ICD-10-CM | POA: Insufficient documentation

## 2023-05-22 DIAGNOSIS — I779 Disorder of arteries and arterioles, unspecified: Secondary | ICD-10-CM

## 2023-05-22 NOTE — Progress Notes (Signed)
Cardiology Office Note  Date: 05/22/2023   ID: [name], DOB 02/08/1961, MRN [id number]  PCP:  Jeffrey Milan, MD  Cardiologist:  Marjo Bicker, MD Electrophysiologist:  None   History of Present Illness: Jeffrey Stokes is a 62 y.o. male known to have history of CVA (3 times, last one in 2012) was referred to cardiology clinic for evaluation of moderate coronary calcifications.  CT angio chest/abdomen/pelvis from 2023 was performed and showed moderate coronary artery calcifications.  He denies having any angina, DOE ever.  No dizziness, lightheadedness, syncope, leg swelling.  No palpitations.  Past Medical History:  Diagnosis Date   Allergy    Anxiety    Benign neoplasm of ascending colon    Benign neoplasm of descending colon    Benign neoplasm of transverse colon    Depression    GERD (gastroesophageal reflux disease)    History of motion sickness    laying on back   HOH (hard of hearing)    bilateral   Hx-TIA (transient ischemic attack) 2012   Sleep apnea    had one sleep study, inconclusive,needs repeat   Stroke (HCC)    3 strokes last one on 2012/ mouth tilt/ weakness right hand side   Wears dentures    full upper and lower    Past Surgical History:  Procedure Laterality Date   COLONOSCOPY WITH PROPOFOL N/A 06/01/2015   Procedure: COLONOSCOPY WITH PROPOFOL;  Surgeon: Jeffrey Minium, MD;  Location: [facility];  Service: Endoscopy;  Laterality: N/A;  ASCENDING COLON POLYP DESCENDING COLON POLYP TRANSVERSE COLON POLYP   COLONOSCOPY WITH PROPOFOL N/A 02/19/2021   Procedure: COLONOSCOPY WITH PROPOFOL;  Surgeon: Jeffrey Minium, MD;  Location: [facility];  Service: Endoscopy;  Laterality: N/A;   EYE SURGERY     Age 51    POLYPECTOMY  02/19/2021   Procedure: POLYPECTOMY;  Surgeon: Jeffrey Minium, MD;  Location: [facility];  Service: Endoscopy;;   REVERSE SHOULDER ARTHROPLASTY Right 05/26/2022   Procedure: REVERSE SHOULDER ARTHROPLASTY;   Surgeon: Jeffrey Broom, MD;  Location: [facility];  Service: Orthopedics;  Laterality: Right;   SALIVARY GLAND SURGERY     SHOULDER ARTHROSCOPY Left 03/2014   bicep and nerve problems still   TONSILLECTOMY     UMBILICAL HERNIA REPAIR N/A 08/25/2015   Procedure: HERNIA REPAIR UMBILICAL ADULT;  Surgeon: Jeffrey Ro, MD;  Location: [facility];  Service: General;  Laterality: N/A;    Current Outpatient Medications  Medication Sig Dispense Refill   aspirin EC 81 MG tablet Take 81 mg by mouth at bedtime.     atorvastatin (LIPITOR) 10 MG tablet Take 1 tablet (10 mg total) by mouth daily. 90 tablet 1   clotrimazole-betamethasone (LOTRISONE) cream Apply 1 application. topically daily. To shaft of penis and under the scrotum (Patient taking differently: Apply 1 application  topically as needed (rash). To shaft of penis and under the scrotum) 30 g 0   hydrocortisone 2.5 % cream APPLY CREAM TOPICALLY DAILY AT 6AM. 28 g 0   Magnesium 300 MG CAPS Take 300 mg by mouth every other day.     meloxicam (MOBIC) 15 MG tablet Take 15 mg by mouth daily.     Multiple Vitamins-Minerals (MULTIVITAMIN ADULT PO) Take 1 tablet by mouth daily. When able to remember     omeprazole (PRILOSEC) 20 MG capsule Take 20 mg by mouth every other day.     potassium chloride (KLOR-CON) 20 MEQ packet Take 20 mEq by  mouth every other day.     tamsulosin (FLOMAX) 0.4 MG CAPS capsule Take 0.4 mg by mouth at bedtime.     valACYclovir (VALTREX) 500 MG tablet Take 1 tablet (500 mg total) by mouth 2 (two) times daily. 60 tablet 5   zinc gluconate 50 MG tablet Take 50 mg by mouth every other day.     No current facility-administered medications for this visit.   Allergies:  Hydrocodone, Lyrica [pregabalin], and Morphine   Social History: The patient  reports that he quit smoking about 17 years ago. His smoking use included cigarettes. He started smoking about 51 years ago. He has a 69 pack-year smoking history. He has quit using smokeless  tobacco.  His smokeless tobacco use included chew. He reports that he does not drink alcohol and does not use drugs.   Family History: The patient's family history includes Osteoarthritis in his father; Stroke in his father.   ROS:  Please see the history of present illness. Otherwise, complete review of systems is positive for none.  All other systems are reviewed and negative.   Physical Exam: VS:  BP 128/76   Pulse 64   Ht 6' (1.829 m)   Wt 202 lb (91.6 kg)   SpO2 97%   BMI 27.40 kg/m , BMI Body mass index is 27.4 kg/m.  Wt Readings from Last 3 Encounters:  05/22/23 202 lb (91.6 kg)  05/17/23 201 lb (91.2 kg)  04/06/23 199 lb (90.3 kg)    General: Patient appears comfortable at rest. HEENT: Conjunctiva and lids normal, oropharynx clear with moist mucosa. Neck: Supple, no elevated JVP or carotid bruits, no thyromegaly. Lungs: Clear to auscultation, nonlabored breathing at rest. Cardiac: Regular rate and rhythm, no S3 or significant systolic murmur, no pericardial rub. Abdomen: Soft, nontender, no hepatomegaly, bowel sounds present, no guarding or rebound. Extremities: No pitting edema, distal pulses 2+. Skin: Warm and dry. Musculoskeletal: No kyphosis. Neuropsychiatric: Alert and oriented x3, affect grossly appropriate.  Recent Labwork: 11/17/2022: Hemoglobin 14.7; Platelets 240; TSH 2.270 03/10/2023: ALT 28; AST 23; BUN 13; Creatinine, Ser 0.97; Potassium 4.5; Sodium 141     Component Value Date/Time   CHOL 122 03/10/2023 1052   TRIG 68 03/10/2023 1052   HDL 51 03/10/2023 1052   CHOLHDL 2.4 03/10/2023 1052   LDLCALC 57 03/10/2023 1052     Assessment and Plan:  Moderate coronary artery calcifications History of CVA x 3 (last stroke in 2012) HLD, at goal   -CT angio chest/abdomen/pelvis in 2023 showed moderate three-vessel coronary calcifications.  Patient is asymptomatic, denies having angina or DOE.  He is already on aspirin 81 mg once daily and atorvastatin 10 mg  nightly which I will continue.  LDL is 57, at goal (goal is less than 55).  In the event he develops new symptoms of chest pain or DOE, he will benefit from stress testing for ischemia evaluation.   I spent a total duration of 30 minutes reviewing the prior notes, imaging studies, discussed coronary calcifications, evaluation and management.  I answered all the questions.  Documented findings in the note.   Medication Adjustments/Labs and Tests Ordered: Current medicines are reviewed at length with the patient today.  Concerns regarding medicines are outlined above.    Disposition:  Follow up as needed  Signed, Kairee Kozma Verne Spurr, MD, 05/22/2023 3:53 PM    [facility] at [facility] [address], [place], [place] [address]

## 2023-05-22 NOTE — Patient Instructions (Signed)
Medication Instructions:  Your physician recommends that you continue on your current medications as directed. Please refer to the Current Medication list given to you today.  *If you need a refill on your cardiac medications before your next appointment, please call your pharmacy*   Lab Work: None If you have labs (blood work) drawn today and your tests are completely normal, you will receive your results only by: MyChart Message (if you have MyChart) OR A paper copy in the mail If you have any lab test that is abnormal or we need to change your treatment, we will call you to review the results.   Testing/Procedures: None    Follow-Up: At [facility], you and your health needs are our priority.  As part of our continuing mission to provide you with exceptional heart care, we have created designated Provider Care Teams.  These Care Teams include your primary Cardiologist (physician) and Advanced Practice Providers (APPs -  Physician Assistants and Nurse Practitioners) who all work together to provide you with the care you need, when you need it.  We recommend signing up for the patient portal called "MyChart".  Sign up information is provided on this After Visit Summary.  MyChart is used to connect with patients for Virtual Visits (Telemedicine).  Patients are able to view lab/test results, encounter notes, upcoming appointments, etc.  Non-urgent messages can be sent to your provider as well.   To learn more about what you can do with MyChart, go to [email or web address].    Your next appointment:    Follow up as needed.  Provider:   You may see Luane School, MD or one of the following Advanced Practice Providers on your designated Care Team:   [place], PA-C  Jacolyn Reedy, [place]     Other Instructions

## 2023-05-23 ENCOUNTER — Other Ambulatory Visit: Payer: Self-pay | Admitting: Internal Medicine

## 2023-05-23 DIAGNOSIS — I6523 Occlusion and stenosis of bilateral carotid arteries: Secondary | ICD-10-CM

## 2023-05-23 MED ORDER — ATORVASTATIN CALCIUM 10 MG PO TABS: 10.0000 mg | ORAL_TABLET | Freq: Every day | ORAL | 1 refills | Status: DC

## 2023-06-02 ENCOUNTER — Ambulatory Visit (INDEPENDENT_AMBULATORY_CARE_PROVIDER_SITE_OTHER): Payer: Medicare HMO | Admitting: Internal Medicine

## 2023-06-02 ENCOUNTER — Encounter: Payer: Self-pay | Admitting: Internal Medicine

## 2023-06-02 VITALS — BP 122/74 | HR 76 | Ht 72.0 in | Wt 209.0 lb

## 2023-06-02 DIAGNOSIS — N451 Epididymitis: Secondary | ICD-10-CM | POA: Diagnosis not present

## 2023-06-02 DIAGNOSIS — R102 Pelvic and perineal pain: Secondary | ICD-10-CM | POA: Diagnosis not present

## 2023-06-02 LAB — POCT URINALYSIS DIPSTICK
Bilirubin, UA: NEGATIVE
Blood, UA: NEGATIVE
Glucose, UA: NEGATIVE
Ketones, UA: NEGATIVE
Leukocytes, UA: NEGATIVE
Nitrite, UA: NEGATIVE
Protein, UA: NEGATIVE
Spec Grav, UA: <=1.005 — AB (ref 1.010–1.025)
Urobilinogen, UA: 0.2 U/dL
pH, UA: 5 (ref 5.0–8.0)

## 2023-06-02 MED ORDER — DOXYCYCLINE HYCLATE 100 MG PO TABS: 100.0000 mg | ORAL_TABLET | Freq: Two times a day (BID) | ORAL | 0 refills | Status: AC

## 2023-06-02 MED ORDER — MELOXICAM 15 MG PO TABS: 15.0000 mg | ORAL_TABLET | Freq: Every day | ORAL | 0 refills | Status: DC

## 2023-06-02 NOTE — Progress Notes (Signed)
Date:  06/02/2023   Name:  Jeffrey Stokes   DOB:  June 30, 1960   MRN:  [id number]   Chief Complaint: Groin Pain (Pain right above penis in groin. Started months ago. Sunday at church his pain was so bad, he had to leave. )  Groin Pain The patient's primary symptoms include pelvic pain and testicular pain. The patient's pertinent negatives include no penile discharge, penile pain or scrotal swelling. This is a new problem. The current episode started more than 1 month ago. The problem occurs constantly. The problem has been gradually worsening. The pain is moderate. Pertinent negatives include no chest pain, chills, dysuria or shortness of breath. The testicular pain affects the left testicle. The color of the testicles is Normal. The symptoms are aggravated by heavy lifting. He has tried nothing for the symptoms.    Review of Systems  Constitutional:  Negative for chills and fatigue.  Respiratory:  Negative for chest tightness and shortness of breath.   Cardiovascular:  Negative for chest pain.  Genitourinary:  Positive for pelvic pain and testicular pain. Negative for dysuria, penile discharge, penile pain and scrotal swelling.     Lab Results  Component Value Date   NA 141 03/10/2023   K 4.5 03/10/2023   CO2 22 03/10/2023   GLUCOSE 99 03/10/2023   BUN 13 03/10/2023   CREATININE 0.97 03/10/2023   CALCIUM 9.7 03/10/2023   EGFR 90 03/10/2023   GFRNONAA >60 05/25/2022   Lab Results  Component Value Date   CHOL 122 03/10/2023   HDL 51 03/10/2023   LDLCALC 57 03/10/2023   TRIG 68 03/10/2023   CHOLHDL 2.4 03/10/2023   Lab Results  Component Value Date   TSH 2.270 11/17/2022   Lab Results  Component Value Date   HGBA1C 5.5 05/25/2022   Lab Results  Component Value Date   WBC 10.9 (H) 11/17/2022   HGB 14.7 11/17/2022   HCT 42.6 11/17/2022   MCV 96 11/17/2022   PLT 240 11/17/2022   Lab Results  Component Value Date   ALT 28 03/10/2023   AST 23 03/10/2023   ALKPHOS 87  03/10/2023   BILITOT 0.7 03/10/2023   No results found for: "25OHVITD2", "25OHVITD3", "VD25OH"   Patient Active Problem List   Diagnosis Date Noted   Coronary artery calcification 05/22/2023   Chest pain 03/10/2023   Left groin pain 03/10/2023   Blood glucose elevated 04/08/2022   Pulmonary nodules/lesions, multiple 12/17/2021   Mixed hyperlipidemia 06/30/2021   BPH with obstruction/lower urinary tract symptoms 06/30/2021   Bilateral carotid artery disease, unspecified type (HCC) 06/30/2021   History of colonic polyps    Polyp of transverse colon    Gastroesophageal reflux disease 03/14/2017   Ulnar neuritis, left 04/20/2016   Complex regional pain syndrome type 2 of left upper extremity 02/12/2016   Rhinitis, allergic 07/29/2015   LPRD (laryngopharyngeal reflux disease) 03/31/2015   Panic disorder without agoraphobia 03/31/2015   History of stroke 12/03/2014   HSV-2 infection 12/03/2014   Obstructive apnea 12/03/2014   Disorder of rotator cuff 12/03/2014   Mild atherosclerosis of carotid artery, bilateral     Allergies  Allergen Reactions   Hydrocodone Nausea Only and Anxiety   Lyrica [Pregabalin] Other (See Comments)    "drunk feeling" "felt like I was going to die"    Morphine Other (See Comments)    Not effective     Past Surgical History:  Procedure Laterality Date   COLONOSCOPY WITH PROPOFOL N/A 06/01/2015  Procedure: COLONOSCOPY WITH PROPOFOL;  Surgeon: Midge Minium, MD;  Location: [facility];  Service: Endoscopy;  Laterality: N/A;  ASCENDING COLON POLYP DESCENDING COLON POLYP TRANSVERSE COLON POLYP   COLONOSCOPY WITH PROPOFOL N/A 02/19/2021   Procedure: COLONOSCOPY WITH PROPOFOL;  Surgeon: Midge Minium, MD;  Location: [facility];  Service: Endoscopy;  Laterality: N/A;   EYE SURGERY     Age 62    POLYPECTOMY  02/19/2021   Procedure: POLYPECTOMY;  Surgeon: Midge Minium, MD;  Location: [facility];  Service: Endoscopy;;   REVERSE SHOULDER  ARTHROPLASTY Right 05/26/2022   Procedure: REVERSE SHOULDER ARTHROPLASTY;  Surgeon: Jones Broom, MD;  Location: [facility];  Service: Orthopedics;  Laterality: Right;   SALIVARY GLAND SURGERY     SHOULDER ARTHROSCOPY Left 03/2014   bicep and nerve problems still   TONSILLECTOMY     UMBILICAL HERNIA REPAIR N/A 08/25/2015   Procedure: HERNIA REPAIR UMBILICAL ADULT;  Surgeon: Leafy Ro, MD;  Location: [facility];  Service: General;  Laterality: N/A;    Social History   Tobacco Use   Smoking status: Former    Current packs/day: 0.00    Average packs/day: 2.0 packs/day for 34.5 years (69.0 ttl pk-yrs)    Types: Cigarettes    Start date: 06/28/1971    Quit date: 12/25/2005    Years since quitting: 17.4   Smokeless tobacco: Former    Types: Engineer, drilling   Vaping status: Never Used  Substance Use Topics   Alcohol use: No    Alcohol/week: 0.0 standard drinks of alcohol   Drug use: Never     Medication list has been reviewed and updated.  Current Meds  Medication Sig   aspirin EC 81 MG tablet Take 81 mg by mouth at bedtime.   atorvastatin (LIPITOR) 10 MG tablet Take 1 tablet (10 mg total) by mouth daily.   clotrimazole-betamethasone (LOTRISONE) cream Apply 1 application. topically daily. To shaft of penis and under the scrotum (Patient taking differently: Apply 1 application  topically as needed (rash). To shaft of penis and under the scrotum)   doxycycline (VIBRA-TABS) 100 MG tablet Take 1 tablet (100 mg total) by mouth 2 (two) times daily for 10 days.   hydrocortisone 2.5 % cream APPLY CREAM TOPICALLY DAILY AT 6AM.   Magnesium 300 MG CAPS Take 300 mg by mouth every other day.   Multiple Vitamins-Minerals (MULTIVITAMIN ADULT PO) Take 1 tablet by mouth daily. When able to remember   omeprazole (PRILOSEC) 20 MG capsule Take 20 mg by mouth every other day.   potassium chloride (KLOR-CON) 20 MEQ packet Take 20 mEq by mouth every other day.   tamsulosin (FLOMAX) 0.4 MG CAPS capsule  Take 0.4 mg by mouth at bedtime.   valACYclovir (VALTREX) 500 MG tablet Take 1 tablet (500 mg total) by mouth 2 (two) times daily.   zinc gluconate 50 MG tablet Take 50 mg by mouth every other day.   [DISCONTINUED] meloxicam (MOBIC) 15 MG tablet Take 15 mg by mouth daily.       06/02/2023    9:28 AM 05/17/2023   10:51 AM 03/10/2023   10:10 AM 10/06/2022   10:17 AM  GAD 7 : Generalized Anxiety Score  Nervous, Anxious, on Edge 0 0 1 3  Control/stop worrying 0 0 1 1  Worry too much - different things 0 0 1 1  Trouble relaxing 0 0 0 0  Restless 0 0 1 0  Easily annoyed or irritable 0 0  1 1  Afraid - awful might happen 0 0 1 0  Total GAD 7 Score 0 0 6 6  Anxiety Difficulty Not difficult at all Not difficult at all Not difficult at all Somewhat difficult       06/02/2023    9:28 AM 05/17/2023   10:51 AM 03/15/2023    8:50 AM  Depression screen PHQ 2/9  Decreased Interest 0 0 0  Down, Depressed, Hopeless 0 0 0  PHQ - 2 Score 0 0 0  Altered sleeping 0 0 0  Tired, decreased energy 0 0 0  Change in appetite 0 0 0  Feeling bad or failure about yourself  0 0 0  Trouble concentrating 0 0 0  Moving slowly or fidgety/restless 0 0 0  Suicidal thoughts 0 0 0  PHQ-9 Score 0 0 0  Difficult doing work/chores Not difficult at all Not difficult at all Not difficult at all    BP Readings from Last 3 Encounters:  06/02/23 122/74  05/22/23 128/76  05/17/23 124/78    Physical Exam Vitals and nursing note reviewed.  Constitutional:      General: He is not in acute distress.    Appearance: He is well-developed.  HENT:     Head: Normocephalic and atraumatic.  Cardiovascular:     Rate and Rhythm: Normal rate and regular rhythm.  Pulmonary:     Effort: Pulmonary effort is normal. No respiratory distress.     Breath sounds: No wheezing or rhonchi.  Genitourinary:    Penis: Normal and circumcised. No erythema, discharge or lesions.      Testes:        Left: Tenderness present. Mass,  swelling, testicular hydrocele or varicocele not present.     Epididymis:     Right: Normal. No tenderness.     Left: Tenderness present.  Skin:    General: Skin is warm and dry.     Findings: No rash.  Neurological:     Mental Status: He is alert and oriented to person, place, and time.  Psychiatric:        Mood and Affect: Mood normal.        Behavior: Behavior normal.    Lab Results  Component Value Date   COLORU yellow 06/02/2023   CLARITYU clear 06/02/2023   GLUCOSEUR Negative 06/02/2023   BILIRUBINUR neg 06/02/2023   KETONESU neg 06/02/2023   SPECGRAV <=1.005 (A) 06/02/2023   RBCUR neg 06/02/2023   PHUR 5.0 06/02/2023   PROTEINUR Negative 06/02/2023   UROBILINOGEN 0.2 06/02/2023   LEUKOCYTESUR Negative 06/02/2023     Wt Readings from Last 3 Encounters:  06/02/23 209 lb (94.8 kg)  05/22/23 202 lb (91.6 kg)  05/17/23 201 lb (91.2 kg)    BP 122/74   Pulse 76   Ht 6' (1.829 m)   Wt 209 lb (94.8 kg)   SpO2 97%   BMI 28.35 kg/m   Assessment and Plan:  Problem List Items Addressed This Visit   None Visit Diagnoses     Epididymitis, left    -  Primary   will treat with Doxycline and Mobic call if no improvement in next week   Relevant Medications   doxycycline (VIBRA-TABS) 100 MG tablet   Pelvic pain       UA negative suspect discomfort is referred pain from the testicle or prostate   Relevant Medications   meloxicam (MOBIC) 15 MG tablet   Other Relevant Orders   POCT urinalysis dipstick (Completed)  No follow-ups on file.    Reubin Milan, MD [facility] [place]

## 2023-06-07 ENCOUNTER — Encounter: Payer: Self-pay | Admitting: Internal Medicine

## 2023-06-08 ENCOUNTER — Other Ambulatory Visit: Payer: Self-pay | Admitting: Internal Medicine

## 2023-06-08 DIAGNOSIS — N451 Epididymitis: Secondary | ICD-10-CM

## 2023-06-08 MED ORDER — LEVOFLOXACIN 500 MG PO TABS: 500.0000 mg | ORAL_TABLET | Freq: Every day | ORAL | 0 refills | Status: AC

## 2023-06-15 ENCOUNTER — Encounter: Payer: Self-pay | Admitting: Internal Medicine

## 2023-06-15 ENCOUNTER — Telehealth: Payer: Self-pay | Admitting: Internal Medicine

## 2023-06-15 NOTE — Telephone Encounter (Signed)
Can referral be placed?  KP

## 2023-06-15 NOTE — Telephone Encounter (Signed)
Referral Request - Did the patient discuss referral with their provider in the last year? Yes (If No - schedule appointment) (If Yes - send message)  Appointment offered? No  Type of order/referral and detailed reason for visit: pt needs urology referral. Pt states  he is having pain between belly button and bottom area.  Preference of office, provider, location: Dr Apolinar Junes   If referral order, have you been seen by this specialty before? No (If Yes, this issue or another issue? When? Where?  Can we respond through MyChart? Yes

## 2023-06-16 ENCOUNTER — Other Ambulatory Visit: Payer: Self-pay

## 2023-06-16 DIAGNOSIS — N451 Epididymitis: Secondary | ICD-10-CM

## 2023-07-10 ENCOUNTER — Encounter: Payer: Self-pay | Admitting: Internal Medicine

## 2023-07-18 ENCOUNTER — Other Ambulatory Visit: Payer: Self-pay

## 2023-07-18 DIAGNOSIS — N451 Epididymitis: Secondary | ICD-10-CM

## 2023-07-21 ENCOUNTER — Other Ambulatory Visit
Admission: RE | Admit: 2023-07-21 | Discharge: 2023-07-21 | Disposition: A | Discharge status: Home / Self Care | Payer: Medicare HMO | Attending: Urology | Admitting: Urology

## 2023-07-21 ENCOUNTER — Ambulatory Visit: Payer: Medicare HMO | Admitting: Urology

## 2023-07-21 VITALS — BP 135/81 | HR 73 | Ht 72.0 in | Wt 215.4 lb

## 2023-07-21 DIAGNOSIS — R399 Unspecified symptoms and signs involving the genitourinary system: Secondary | ICD-10-CM

## 2023-07-21 DIAGNOSIS — N451 Epididymitis: Secondary | ICD-10-CM | POA: Insufficient documentation

## 2023-07-21 DIAGNOSIS — Z125 Encounter for screening for malignant neoplasm of prostate: Secondary | ICD-10-CM

## 2023-07-21 DIAGNOSIS — Z8042 Family history of malignant neoplasm of prostate: Secondary | ICD-10-CM

## 2023-07-21 DIAGNOSIS — K4020 Bilateral inguinal hernia, without obstruction or gangrene, not specified as recurrent: Secondary | ICD-10-CM

## 2023-07-21 LAB — URINALYSIS, COMPLETE (UACMP) WITH MICROSCOPIC
Bacteria, UA: NONE SEEN
Bilirubin Urine: NEGATIVE
Glucose, UA: NEGATIVE mg/dL
Hgb urine dipstick: NEGATIVE
Ketones, ur: NEGATIVE mg/dL
Leukocytes,Ua: NEGATIVE
Nitrite: NEGATIVE
Protein, ur: NEGATIVE mg/dL
RBC / HPF: NONE SEEN RBC/hpf (ref 0–5)
Specific Gravity, Urine: 1.02 (ref 1.005–1.030)
Squamous Epithelial / HPF: NONE SEEN /[HPF] (ref 0–5)
WBC, UA: NONE SEEN WBC/hpf (ref 0–5)
pH: 5 (ref 5.0–8.0)

## 2023-07-21 NOTE — Progress Notes (Signed)
I,Amy L Pierron,acting as a scribe for Vanna Scotland, MD.,have documented all relevant documentation on the behalf of Vanna Scotland, MD,as directed by  Vanna Scotland, MD while in the presence of Vanna Scotland, MD.  07/21/2023 9:19 PM   Jeanice Lim 12/09/1960 [id number]  Referring provider: Reubin Milan, MD [address] [place],  [place] [address]  Chief Complaint  Patient presents with   Establish Care   Testicle Pain    HPI: 63 year-old male presents today for further evaluation of a growth in his right groin area.  He  saw Dr. Judithann Graves on 06/02/2023 with pelvic and testicular pain, no penile pain, discharge or swelling. The episode was relatively chronic. His UA was negative. She treated him presumptively for left epididymitis with Doxycycline and Mobic.  His most recent PSA from 05/17/2023 was 0.5   He had a CT of the pelvis with contrast on 05/08/2023 which showed small fat containing inguinal hernias, left greater than right, no bowel identified, and prostomegaly.  He reports in his right groin area he feels a hard spot. He mostly feels it with a morning erection and it causes him a lot of pain.  He mentions his testicles also hurt. He has intermittent burning with urination.  He has been on Flomax for about 9 months because he felt he wasn't fully emptying his bladder. The medicine has been helping.  He has a family history (uncles) of prostate cancer.  He had a CT scan and was told he had 2 hernias and was told it wasn't anything major. He saw the general surgeon, Dr. Lillia Abed Bridgers about 3-4 months ago.   Results for orders placed or performed during the hospital encounter of 07/21/23  Urinalysis, Complete w Microscopic -  Result Value Ref Range   Color, Urine YELLOW YELLOW   APPearance CLEAR CLEAR   Specific Gravity, Urine 1.020 1.005 - 1.030   pH 5.0 5.0 - 8.0   Glucose, UA NEGATIVE NEGATIVE mg/dL   Hgb urine dipstick NEGATIVE NEGATIVE    Bilirubin Urine NEGATIVE NEGATIVE   Ketones, ur NEGATIVE NEGATIVE mg/dL   Protein, ur NEGATIVE NEGATIVE mg/dL   Nitrite NEGATIVE NEGATIVE   Leukocytes,Ua NEGATIVE NEGATIVE   Squamous Epithelial / HPF NONE SEEN 0 - 5 /HPF   WBC, UA NONE SEEN 0 - 5 WBC/hpf   RBC / HPF NONE SEEN 0 - 5 RBC/hpf   Bacteria, UA NONE SEEN NONE SEEN   PVR today is 0.  PMH: Past Medical History:  Diagnosis Date   Allergy    Anxiety    Benign neoplasm of ascending colon    Benign neoplasm of descending colon    Benign neoplasm of transverse colon    Depression    GERD (gastroesophageal reflux disease)    History of motion sickness    laying on back   HOH (hard of hearing)    bilateral   Hx-TIA (transient ischemic attack) 2012   Sleep apnea    had one sleep study, inconclusive,needs repeat   Stroke (HCC)    3 strokes last one on 2012/ mouth tilt/ weakness right hand side   Wears dentures    full upper and lower    Surgical History: Past Surgical History:  Procedure Laterality Date   COLONOSCOPY WITH PROPOFOL N/A 06/01/2015   Procedure: COLONOSCOPY WITH PROPOFOL;  Surgeon: Midge Minium, MD;  Location: [facility];  Service: Endoscopy;  Laterality: N/A;  ASCENDING COLON POLYP DESCENDING COLON POLYP TRANSVERSE COLON  POLYP   COLONOSCOPY WITH PROPOFOL N/A 02/19/2021   Procedure: COLONOSCOPY WITH PROPOFOL;  Surgeon: Midge Minium, MD;  Location: [facility];  Service: Endoscopy;  Laterality: N/A;   EYE SURGERY     Age 57    POLYPECTOMY  02/19/2021   Procedure: POLYPECTOMY;  Surgeon: Midge Minium, MD;  Location: [facility];  Service: Endoscopy;;   REVERSE SHOULDER ARTHROPLASTY Right 05/26/2022   Procedure: REVERSE SHOULDER ARTHROPLASTY;  Surgeon: Jones Broom, MD;  Location: [facility];  Service: Orthopedics;  Laterality: Right;   SALIVARY GLAND SURGERY     SHOULDER ARTHROSCOPY Left 03/2014   bicep and nerve problems still   TONSILLECTOMY     UMBILICAL HERNIA REPAIR N/A  08/25/2015   Procedure: HERNIA REPAIR UMBILICAL ADULT;  Surgeon: Leafy Ro, MD;  Location: [facility];  Service: General;  Laterality: N/A;    Home Medications:  Allergies as of 07/21/2023       Reactions   Hydrocodone Nausea Only, Anxiety   Lyrica [pregabalin] Other (See Comments)   "drunk feeling" "felt like I was going to die"    Morphine Other (See Comments)   Not effective         Medication List        Accurate as of July 21, 2023  9:19 PM. If you have any questions, ask your nurse or doctor.          STOP taking these medications    clotrimazole-betamethasone cream Commonly known as: LOTRISONE   hydrocortisone 2.5 % cream       TAKE these medications    aspirin EC 81 MG tablet Take 81 mg by mouth at bedtime.   atorvastatin 10 MG tablet Commonly known as: LIPITOR Take 1 tablet (10 mg total) by mouth daily.   Magnesium 300 MG Caps Take 300 mg by mouth every other day.   meloxicam 15 MG tablet Commonly known as: MOBIC Take 1 tablet (15 mg total) by mouth daily.   MULTIVITAMIN ADULT PO Take 1 tablet by mouth daily. When able to remember   omeprazole 20 MG capsule Commonly known as: PRILOSEC Take 20 mg by mouth every other day.   potassium chloride 20 MEQ packet Commonly known as: KLOR-CON Take 20 mEq by mouth every other day.   tamsulosin 0.4 MG Caps capsule Commonly known as: FLOMAX Take 0.4 mg by mouth at bedtime.   valACYclovir 500 MG tablet Commonly known as: VALTREX Take 1 tablet (500 mg total) by mouth 2 (two) times daily.   zinc gluconate 50 MG tablet Take 50 mg by mouth every other day.        Allergies:  Allergies  Allergen Reactions   Hydrocodone Nausea Only and Anxiety   Lyrica [Pregabalin] Other (See Comments)    "drunk feeling" "felt like I was going to die"    Morphine Other (See Comments)    Not effective     Family History: Family History  Problem Relation Age of Onset   Osteoarthritis Father    Stroke  Father     Social History:  reports that he quit smoking about 17 years ago. His smoking use included cigarettes. He started smoking about 52 years ago. He has a 69 pack-year smoking history. He has quit using smokeless tobacco.  His smokeless tobacco use included chew. He reports that he does not drink alcohol and does not use drugs.   Physical Exam: BP 135/81   Pulse 73   Ht 6' (1.829 m)  Wt 215 lb 6 oz (97.7 kg)   BMI 29.21 kg/m   Constitutional:  Alert and oriented, No acute distress. HEENT: [place] AT, moist mucus membranes.  Trachea midline, no masses. REctal Normal bilateral descended testicles. Prostate slightly larger on left.  Neurologic: Grossly intact, no focal deficits, moving all 4 extremities. Psychiatric: Normal mood and affect.   Urinalysis    Component Value Date/Time   COLORURINE YELLOW 07/21/2023 0912   APPEARANCEUR CLEAR 07/21/2023 0912   LABSPEC 1.020 07/21/2023 0912   PHURINE 5.0 07/21/2023 0912   GLUCOSEU NEGATIVE 07/21/2023 0912   HGBUR NEGATIVE 07/21/2023 0912   BILIRUBINUR NEGATIVE 07/21/2023 0912   BILIRUBINUR neg 06/02/2023 0942   KETONESUR NEGATIVE 07/21/2023 0912   PROTEINUR NEGATIVE 07/21/2023 0912   UROBILINOGEN 0.2 06/02/2023 0942   NITRITE NEGATIVE 07/21/2023 0912   LEUKOCYTESUR NEGATIVE 07/21/2023 0912    Lab Results  Component Value Date   BACTERIA NONE SEEN 07/21/2023   Pertinent Imaging: Narrative & Impression  CLINICAL DATA:  Left groin pain.   EXAM: CT PELVIS WITH CONTRAST   TECHNIQUE: Multidetector CT imaging of the pelvis was performed using the standard protocol following the bolus administration of intravenous contrast.   RADIATION DOSE REDUCTION: This exam was performed according to the departmental dose-optimization program which includes automated exposure control, adjustment of the mA and/or kV according to patient size and/or use of iterative reconstruction technique.   CONTRAST:  OMNIPAQUE IOHEXOL 300  MG/ML  SOLN   COMPARISON:  Renal stone CT 12/09/2021   FINDINGS: Urinary Tract: Bladder is mildly distended. Grossly preserved contour. Ureteral jets are seen.   Bowel: Visualized bowel in the pelvis is nondilated. There is scattered colonic stool. Normal appendix. Sigmoid colon diverticula.   Vascular/Lymphatic: Scattered vascular calcifications. Normal caliber of the iliac vessels. No specific abnormal lymph node enlargement identified in the pelvis.   Reproductive: Heterogeneous mildly enlarged prostate with some mass effect along the base of the bladder. Please correlate with patient's PSA.   Other: Small fat containing bilateral inguinal hernias, left-greater-than-right. No bowel involvement. No other pelvic wall hernia identified. Slight motion artifact.   Musculoskeletal: Degenerative changes along the pelvis. Pars defects at L5 with grade 1 anterolisthesis. Some disc bulging.   IMPRESSION: Small fat containing inguinal hernias, left-greater-than-right. No bowel containing hernia identified.   Colonic diverticula.   Enlarged prostate   Electronically Signed   By: Karen Kays M.D.   On: 05/15/2023 13:36  Personally reviewed the above scan and agree with radiologic interpretation.   Assessment & Plan:    1. Bilateral inguinal hernias  - All of his symptoms are most consistent with groin and pain (round hard bulge in groin that comes and goes), presumably from his hernias. They are in fact symptomatic. Encouraged to follow up with Dr. Cottie Banda.   2. Urinary symptoms  - He's on Flomax for his urinary symptoms, well controlled. PVR today is 0.  3. Prostate cancer screening  - He has a family history of prostate cancer.  - Rectal exam today. No concern for prostate cancer at this time. Continue annual screening with his PCP.  Return if symptoms worsen or fail to improve.  I have reviewed the above documentation for accuracy and completeness, and I agree with  the above.   Vanna Scotland, MD   [facility] [address], [address] [place], [place] [address] [phone]

## 2023-07-25 ENCOUNTER — Ambulatory Visit: Payer: Medicare HMO | Admitting: General Surgery

## 2023-07-25 ENCOUNTER — Encounter: Payer: Self-pay | Admitting: General Surgery

## 2023-07-25 VITALS — BP 135/87 | HR 63 | Temp 97.6°F | Resp 16 | Ht 72.0 in | Wt 217.0 lb

## 2023-07-25 DIAGNOSIS — K4020 Bilateral inguinal hernia, without obstruction or gangrene, not specified as recurrent: Secondary | ICD-10-CM

## 2023-07-25 HISTORY — DX: Bilateral inguinal hernia, without obstruction or gangrene, not specified as recurrent: K40.20

## 2023-07-25 NOTE — Patient Instructions (Signed)
Robotic Assisted Laparoscopic Inguinal Hernia Repair, Adult Laparoscopic inguinal hernia repair is a surgical procedure to repair a small, weak spot in the groin muscles that allows fat or intestines from inside the abdomen to bulge out (inguinal hernia). This procedure may be planned, or it may be an emergency procedure. During the procedure, tissue that has bulged out is moved back into place, and the opening in the groin muscles is repaired. This is done through three small incisions in the abdomen. A thin tube with a light and camera on the end (laparoscope) is used to help perform the procedure. Tell a health care provider about: Any allergies you have. All medicines you are taking, including vitamins, herbs, eye drops, creams, and over-the-counter medicines. Any problems you or family members have had with anesthetic medicines. Any blood disorders you have. Any surgeries you have had. Any medical conditions you have. Whether you are pregnant or may be pregnant. What are the risks? Generally, this is a safe procedure. However, problems may occur, including: Infection. Bleeding. Allergic reactions to medicines. Damage to nearby structures or organs. Testicle damage or long-term pain and swelling of the scrotum, in males. Inability to completely empty the bladder (urinary retention). Blood clots. A collection of fluid that builds up under the skin (seroma). The hernia coming back (recurrence). What happens before the procedure? Medicines Ask your health care provider about: Changing or stopping your regular medicines. This is especially important if you are taking diabetes medicines or blood thinners. Taking medicines such as aspirin and ibuprofen. These medicines can thin your blood. Do not take these medicines unless your health care provider tells you to take them. Taking over-the-counter medicines, vitamins, herbs, and supplements. General instructions Do not use any products that  contain nicotine or tobacco for at least 4 weeks before the procedure, if possible. These products include cigarettes, chewing tobacco, and vaping devices, such as e-cigarettes. If you need help quitting, ask your health care provider. Ask your health care provider: How your surgery site will be marked. What steps will be taken to help prevent infection. These steps may include: Removing hair at the surgery site. Washing skin with a germ-killing soap. Taking antibiotic medicine. Plan to have a responsible adult take you home from the hospital or clinic. Plan to have a responsible adult care for you for the time you are told after you leave the hospital or clinic. This is important. What happens during the procedure? An IV will be inserted into one of your veins. You will be given one or more of the following: A medicine to help you relax (sedative). A medicine to make you fall asleep (general anesthetic). Three small incisions will be made in your abdomen. Your abdomen will be inflated with carbon dioxide gas to make the surgical area easier to see. A laparoscope and surgical instruments will be inserted through the incisions. The laparoscope will send images of the inside of your abdomen to a monitor in the room. Tissue that is bulging through the hernia may be removed or moved back into place. The hernia opening will be closed with a sheet of surgical mesh. The surgical instruments and laparoscope will be removed. Your incisions will be closed with stitches (sutures) and adhesive strips. A bandage (dressing) will be placed over your incisions. The procedure may vary among health care providers and hospitals. What happens after the procedure? Your blood pressure, heart rate, breathing rate, and blood oxygen level will be monitored until you leave the hospital or  clinic. You will be given pain medicine as needed. You may continue to receive medicines and fluids through an IV. The IV will be  removed after you can drink fluids. You will be encouraged to get up and move around and to take deep breaths frequently. If you were given a sedative during the procedure, it can affect you for several hours. Do not drive or operate machinery until your health care provider says that it is safe. Summary Laparoscopic inguinal hernia repair is a surgical procedure to repair a small, weak spot in the groin muscles that allows fat or intestines from inside the abdomen to bulge out (inguinal hernia). This procedure is done through three small incisions in the abdomen. A thin tube with a light and camera on the end (laparoscope) is used to help perform the procedure. After the procedure, you will be encouraged to get up and move around and to take deep breaths frequently. This information is not intended to replace advice given to you by your health care provider. Make sure you discuss any questions you have with your health care provider. Document Revised: 02/11/2020 Document Reviewed: 02/11/2020 Elsevier Patient Education  2024 [employer].

## 2023-07-26 NOTE — H&P (Signed)
[facility] History and Physical   Chief Complaint   Follow-up     Jeffrey Stokes is a 63 y.o. male.  HPI: Patient continues to have right > left groin pain and findings of bilateral inguinal hernia with fat. He has been seen by his cardiologist in the fall and was asymptomatic and has no current chest pain or shortness of breath. They did not think he needed any further work up or intervention.  He says his pain is worse with erections and he has seen Urology who does not think it is anything outside of the hernias. He is on flomax and was treated for epididymitis in the past.   He has continued to have this bulge and pain and is wanting to get his hernias repaired before mowing season starts.   Past Medical History:  Diagnosis Date   Allergy    Anxiety    Benign neoplasm of ascending colon    Benign neoplasm of descending colon    Benign neoplasm of transverse colon    Depression    GERD (gastroesophageal reflux disease)    History of motion sickness    laying on back   HOH (hard of hearing)    bilateral   Hx-TIA (transient ischemic attack) 2012   Sleep apnea    had one sleep study, inconclusive,needs repeat   Stroke (HCC)    3 strokes last one on 2012/ mouth tilt/ weakness right hand side   Wears dentures    full upper and lower    Past Surgical History:  Procedure Laterality Date   COLONOSCOPY WITH PROPOFOL N/A 06/01/2015   Procedure: COLONOSCOPY WITH PROPOFOL;  Surgeon: Midge Minium, MD;  Location: [facility];  Service: Endoscopy;  Laterality: N/A;  ASCENDING COLON POLYP DESCENDING COLON POLYP TRANSVERSE COLON POLYP   COLONOSCOPY WITH PROPOFOL N/A 02/19/2021   Procedure: COLONOSCOPY WITH PROPOFOL;  Surgeon: Midge Minium, MD;  Location: [facility];  Service: Endoscopy;  Laterality: N/A;   EYE SURGERY     Age 11    POLYPECTOMY  02/19/2021   Procedure: POLYPECTOMY;  Surgeon: Midge Minium, MD;  Location: [facility];  Service:  Endoscopy;;   REVERSE SHOULDER ARTHROPLASTY Right 05/26/2022   Procedure: REVERSE SHOULDER ARTHROPLASTY;  Surgeon: Jones Broom, MD;  Location: [facility];  Service: Orthopedics;  Laterality: Right;   SALIVARY GLAND SURGERY     SHOULDER ARTHROSCOPY Left 03/2014   bicep and nerve problems still   TONSILLECTOMY     UMBILICAL HERNIA REPAIR N/A 08/25/2015   Procedure: HERNIA REPAIR UMBILICAL ADULT;  Surgeon: Leafy Ro, MD;  Location: [facility];  Service: General;  Laterality: N/A;    Family History  Problem Relation Age of Onset   Osteoarthritis Father    Stroke Father     Social History   Tobacco Use   Smoking status: Former    Current packs/day: 0.00    Average packs/day: 2.0 packs/day for 34.5 years (69.0 ttl pk-yrs)    Types: Cigarettes    Start date: 06/28/1971    Quit date: 12/25/2005    Years since quitting: 17.5   Smokeless tobacco: Former    Types: Associate Professor status: Never Used  Substance Use Topics   Alcohol use: No    Alcohol/week: 0.0 standard drinks of alcohol   Drug use: Never    Medications: I have reviewed the patient's current medications. Allergies as of 07/25/2023       Reactions  Hydrocodone Nausea Only, Anxiety   Lyrica [pregabalin] Other (See Comments)   "drunk feeling" "felt like I was going to die"    Morphine Other (See Comments)   Not effective         Medication List        Accurate as of July 25, 2023 11:59 PM. If you have any questions, ask your nurse or doctor.          aspirin EC 81 MG tablet Take 81 mg by mouth at bedtime.   atorvastatin 10 MG tablet Commonly known as: LIPITOR Take 1 tablet (10 mg total) by mouth daily.   Magnesium 300 MG Caps Take 300 mg by mouth every other day.   meloxicam 15 MG tablet Commonly known as: MOBIC Take 1 tablet (15 mg total) by mouth daily.   MULTIVITAMIN ADULT PO Take 1 tablet by mouth daily. When able to remember   omeprazole 20 MG capsule Commonly known as:  PRILOSEC Take 20 mg by mouth every other day.   potassium chloride 20 MEQ packet Commonly known as: KLOR-CON Take 20 mEq by mouth every other day.   tamsulosin 0.4 MG Caps capsule Commonly known as: FLOMAX Take 0.4 mg by mouth at bedtime.   valACYclovir 500 MG tablet Commonly known as: VALTREX Take 1 tablet (500 mg total) by mouth 2 (two) times daily.   zinc gluconate 50 MG tablet Take 50 mg by mouth every other day.         ROS:  A comprehensive review of systems was negative except for: Gastrointestinal: positive for groin pain and bulge  Blood pressure 135/87, pulse 63, temperature 97.6 F (36.4 C), temperature source Oral, resp. rate 16, height 6' (1.829 m), weight 217 lb (98.4 kg), SpO2 94%. Physical Exam Vitals reviewed.  HENT:     Head: Normocephalic.     Nose: Nose normal.  Eyes:     Extraocular Movements: Extraocular movements intact.  Cardiovascular:     Rate and Rhythm: Normal rate and regular rhythm.  Pulmonary:     Effort: Pulmonary effort is normal.     Breath sounds: Normal breath sounds.  Abdominal:     General: There is no distension.     Palpations: Abdomen is soft.     Tenderness: There is no abdominal tenderness.     Hernia: A hernia is present. Hernia is present in the left inguinal area and right inguinal area.  Musculoskeletal:        General: Normal range of motion.     Cervical back: Normal range of motion.  Skin:    General: Skin is warm.  Neurological:     General: No focal deficit present.     Mental Status: He is alert and oriented to person, place, and time.  Psychiatric:        Mood and Affect: Mood normal.        Behavior: Behavior normal.     Results: personally reviewed CT scan -bilateral fat containing hernias    Assessment & Plan:  Jeffrey Stokes is a 63 y.o. male with bilateral inguinal hernias and groin pain. He wants to proceed with repair.  Discussed the risk and benefits including, bleeding, infection, use of mesh,  risk of recurrence, risk of nerve damage causing numbness or changes in sensation, risk of damage to the cord structures. The patient understands the risk and benefits of repair with mesh, and has decided to proceed.  We also discussed open versus robotic assisted laparoscopic  surgery and the use of mesh. We discussed that I do both robotic and open repairs with mesh, and that these are considered equivalent. We discussed reasons for opting for laparoscopic surgery including if a bilateral repair is needed or if a patient has a recurrence after an open repair. We discussed the option of watch and wait in men and discussed that in 5 years some studies report that 40% of men have crossed over to needing a hernia repair because the hernia has become larger or symptomatic. We discussed that women are not appropriate candidate for watchful waiting due to the risk of femoral hernias.   Plan for robotic bilateral hernia repair.   Discussed if his pain does not improve you can have pain from other issues in the area but likely this is from the hernia.   All questions were answered to the satisfaction of the patient.    Lucretia Roers 07/26/2023, 8:50 AM

## 2023-07-26 NOTE — Progress Notes (Signed)
[facility] History and Physical   Chief Complaint   Follow-up     Jeffrey Stokes is a 63 y.o. male.  HPI: Patient continues to have right > left groin pain and findings of bilateral inguinal hernia with fat. He has been seen by his cardiologist in the fall and was asymptomatic and has no current chest pain or shortness of breath. They did not think he needed any further work up or intervention.  He says his pain is worse with erections and he has seen Urology who does not think it is anything outside of the hernias. He is on flomax and was treated for epididymitis in the past.   He has continued to have this bulge and pain and is wanting to get his hernias repaired before mowing season starts.   Past Medical History:  Diagnosis Date   Allergy    Anxiety    Benign neoplasm of ascending colon    Benign neoplasm of descending colon    Benign neoplasm of transverse colon    Depression    GERD (gastroesophageal reflux disease)    History of motion sickness    laying on back   HOH (hard of hearing)    bilateral   Hx-TIA (transient ischemic attack) 2012   Sleep apnea    had one sleep study, inconclusive,needs repeat   Stroke (HCC)    3 strokes last one on 2012/ mouth tilt/ weakness right hand side   Wears dentures    full upper and lower    Past Surgical History:  Procedure Laterality Date   COLONOSCOPY WITH PROPOFOL N/A 06/01/2015   Procedure: COLONOSCOPY WITH PROPOFOL;  Surgeon: Midge Minium, MD;  Location: [facility];  Service: Endoscopy;  Laterality: N/A;  ASCENDING COLON POLYP DESCENDING COLON POLYP TRANSVERSE COLON POLYP   COLONOSCOPY WITH PROPOFOL N/A 02/19/2021   Procedure: COLONOSCOPY WITH PROPOFOL;  Surgeon: Midge Minium, MD;  Location: [facility];  Service: Endoscopy;  Laterality: N/A;   EYE SURGERY     Age 11    POLYPECTOMY  02/19/2021   Procedure: POLYPECTOMY;  Surgeon: Midge Minium, MD;  Location: [facility];  Service:  Endoscopy;;   REVERSE SHOULDER ARTHROPLASTY Right 05/26/2022   Procedure: REVERSE SHOULDER ARTHROPLASTY;  Surgeon: Jones Broom, MD;  Location: [facility];  Service: Orthopedics;  Laterality: Right;   SALIVARY GLAND SURGERY     SHOULDER ARTHROSCOPY Left 03/2014   bicep and nerve problems still   TONSILLECTOMY     UMBILICAL HERNIA REPAIR N/A 08/25/2015   Procedure: HERNIA REPAIR UMBILICAL ADULT;  Surgeon: Leafy Ro, MD;  Location: [facility];  Service: General;  Laterality: N/A;    Family History  Problem Relation Age of Onset   Osteoarthritis Father    Stroke Father     Social History   Tobacco Use   Smoking status: Former    Current packs/day: 0.00    Average packs/day: 2.0 packs/day for 34.5 years (69.0 ttl pk-yrs)    Types: Cigarettes    Start date: 06/28/1971    Quit date: 12/25/2005    Years since quitting: 17.5   Smokeless tobacco: Former    Types: Associate Professor status: Never Used  Substance Use Topics   Alcohol use: No    Alcohol/week: 0.0 standard drinks of alcohol   Drug use: Never    Medications: I have reviewed the patient's current medications. Allergies as of 07/25/2023       Reactions  Hydrocodone Nausea Only, Anxiety   Lyrica [pregabalin] Other (See Comments)   "drunk feeling" "felt like I was going to die"    Morphine Other (See Comments)   Not effective         Medication List        Accurate as of July 25, 2023 11:59 PM. If you have any questions, ask your nurse or doctor.          aspirin EC 81 MG tablet Take 81 mg by mouth at bedtime.   atorvastatin 10 MG tablet Commonly known as: LIPITOR Take 1 tablet (10 mg total) by mouth daily.   Magnesium 300 MG Caps Take 300 mg by mouth every other day.   meloxicam 15 MG tablet Commonly known as: MOBIC Take 1 tablet (15 mg total) by mouth daily.   MULTIVITAMIN ADULT PO Take 1 tablet by mouth daily. When able to remember   omeprazole 20 MG capsule Commonly known as:  PRILOSEC Take 20 mg by mouth every other day.   potassium chloride 20 MEQ packet Commonly known as: KLOR-CON Take 20 mEq by mouth every other day.   tamsulosin 0.4 MG Caps capsule Commonly known as: FLOMAX Take 0.4 mg by mouth at bedtime.   valACYclovir 500 MG tablet Commonly known as: VALTREX Take 1 tablet (500 mg total) by mouth 2 (two) times daily.   zinc gluconate 50 MG tablet Take 50 mg by mouth every other day.         ROS:  A comprehensive review of systems was negative except for: Gastrointestinal: positive for groin pain and bulge  Blood pressure 135/87, pulse 63, temperature 97.6 F (36.4 C), temperature source Oral, resp. rate 16, height 6' (1.829 m), weight 217 lb (98.4 kg), SpO2 94%. Physical Exam Vitals reviewed.  HENT:     Head: Normocephalic.     Nose: Nose normal.  Eyes:     Extraocular Movements: Extraocular movements intact.  Cardiovascular:     Rate and Rhythm: Normal rate and regular rhythm.  Pulmonary:     Effort: Pulmonary effort is normal.     Breath sounds: Normal breath sounds.  Abdominal:     General: There is no distension.     Palpations: Abdomen is soft.     Tenderness: There is no abdominal tenderness.     Hernia: A hernia is present. Hernia is present in the left inguinal area and right inguinal area.  Musculoskeletal:        General: Normal range of motion.     Cervical back: Normal range of motion.  Skin:    General: Skin is warm.  Neurological:     General: No focal deficit present.     Mental Status: He is alert and oriented to person, place, and time.  Psychiatric:        Mood and Affect: Mood normal.        Behavior: Behavior normal.     Results: personally reviewed CT scan -bilateral fat containing hernias    Assessment & Plan:  Jeffrey Stokes is a 63 y.o. male with bilateral inguinal hernias and groin pain. He wants to proceed with repair.  Discussed the risk and benefits including, bleeding, infection, use of mesh,  risk of recurrence, risk of nerve damage causing numbness or changes in sensation, risk of damage to the cord structures. The patient understands the risk and benefits of repair with mesh, and has decided to proceed.  We also discussed open versus robotic assisted laparoscopic  surgery and the use of mesh. We discussed that I do both robotic and open repairs with mesh, and that these are considered equivalent. We discussed reasons for opting for laparoscopic surgery including if a bilateral repair is needed or if a patient has a recurrence after an open repair. We discussed the option of watch and wait in men and discussed that in 5 years some studies report that 40% of men have crossed over to needing a hernia repair because the hernia has become larger or symptomatic. We discussed that women are not appropriate candidate for watchful waiting due to the risk of femoral hernias.   Plan for robotic bilateral hernia repair.   Discussed if his pain does not improve you can have pain from other issues in the area but likely this is from the hernia.   All questions were answered to the satisfaction of the patient.    Lucretia Roers 07/26/2023, 8:50 AM

## 2023-08-02 NOTE — Patient Instructions (Signed)
 Jeffrey Stokes  08/02/2023     @PREFPERIOPPHARMACY @   Your procedure is scheduled on Monday, 2/10.  [facility] to [facility] at 0700 A.M.  Call this number if you have problems the morning of surgery:  [phone]  If you experience any cold or flu symptoms such as cough, fever, chills, shortness of breath, etc. between now and your scheduled surgery, please notify us  at the above number.   Remember:  Do not eat or drink after midnight.  You may drink clear liquids until 0500 .  Clear liquids allowed are:                    Water , Juice (No red color; non-citric and without pulp; diabetics please choose diet or no sugar options), Carbonated beverages (diabetics please choose diet or no sugar options), Clear Tea (No creamer, milk, or cream, including half & half and powdered creamer), Black Coffee Only (No creamer, milk or cream, including half & half and powdered creamer), Plain Jell-O Only (No red color; diabetics please choose no sugar options), and Clear Sports drink (No red color; diabetics please choose diet or no sugar options)    Take these medicines the morning of surgery with A SIP OF WATER  flomax  and omeprazole    Do not wear jewelry, make-up or nail polish, including gel polish,  artificial nails, or any other type of covering on natural nails (fingers and  toes).  Do not wear lotions, powders, or perfumes, or deodorant.  Do not shave 48 hours prior to surgery.  Men may shave face and neck.  Do not bring valuables to the hospital.  [facility] is not responsible for any belongings or valuables.  Contacts, dentures or bridgework may not be worn into surgery.  Leave your suitcase in the car.  After surgery it may be brought to your room.  For patients admitted to the hospital, discharge time will be determined by your treatment team.  Patients discharged the day of surgery will not be allowed to drive home.   Name and phone number of your driver:   family   Please read over  the following fact sheets that you were given. Coughing and Deep Breathing, Surgical Site Infection Prevention, Anesthesia Post-op Instructions, and Care and Recovery After Surgery      Laparoscopic Inguinal Hernia Repair, Adult, Care After The following information offers guidance on how to care for yourself after your procedure. Your health care provider may also give you more specific instructions. If you have problems or questions, contact your health care provider. What can I expect after the procedure? After the procedure, it is common to have: Pain. Swelling and bruising around the incision area. Scrotal swelling, in males. Some fluid or blood draining from your incisions. Follow these instructions at home: Medicines Take over-the-counter and prescription medicines only as told by your health care provider. Ask your health care provider if the medicine prescribed to you: Requires you to avoid driving or using machinery. Can cause constipation. You may need to take these actions to prevent or treat constipation: Drink enough fluid to keep your urine pale yellow. Take over-the-counter or prescription medicines. Eat foods that are high in fiber, such as beans, whole grains, and fresh fruits and vegetables. Limit foods that are high in fat and processed sugars, such as fried or sweet foods. Incision care  Follow instructions from your health care provider about how to take care of your incisions. Make sure you: [facility]  your hands with soap and water  for at least 20 seconds before and after you change your bandage (dressing). If soap and water  are not available, use hand sanitizer. Change your dressing as told by your health care provider. Leave stitches (sutures), skin glue, or adhesive strips in place. These skin closures may need to stay in place for 2 weeks or longer. If adhesive strip edges start to loosen and curl up, you may trim the loose edges. Do not remove adhesive strips  completely unless your health care provider tells you to do that. Check your incision area every day for signs of infection. Check for: More redness, swelling, or pain. More fluid or blood. Warmth. Pus or a bad smell. Wear loose, soft clothing while your incisions heal. Managing pain and swelling If directed, put ice on the painful or swollen areas. To do this: Put ice in a plastic bag. Place a towel between your skin and the bag. Leave the ice on for 20 minutes, 2-3 times a day. Remove the ice if your skin turns bright red. This is very important. If you cannot feel pain, heat, or cold, you have a greater risk of damage to the area.  Activity Do not lift anything that is heavier than 10 lb (4.5 kg), or the limit that you are told, until your health care provider says that it is safe. Ask your health care provider what activities are safe for you. A lot of activity during the first week after surgery can increase pain and swelling. For 1 week after your procedure: Avoid activities that take a lot of effort, such as exercise or sports. You may walk and climb stairs as needed for daily activity, but avoid long walks or climbing stairs for exercise. General instructions If you were given a sedative during the procedure, it can affect you for several hours. Do not drive or operate machinery until your health care provider says that it is safe. Do not take baths, swim, or use a hot tub until your health care provider approves. Ask your health care provider if you may take showers. You may only be allowed to take sponge baths. Do not use any products that contain nicotine or tobacco. These products include cigarettes, chewing tobacco, and vaping devices, such as e-cigarettes. If you need help quitting, ask your health care provider. Keep all follow-up visits. This is important. Contact a health care provider if: You have any of these signs of infection: More redness, swelling, or pain around your  incisions or your groin area. More fluid or blood coming from an incision. Warmth coming from an incision. Pus or a bad smell coming from an incision. A fever or chills. You have more swelling in your scrotum, if you are male. You have severe pain and medicines do not help. You have abdominal pain or swelling. You cannot urinate or have a bowel movement. You faint or feel dizzy. You have nausea and vomiting. Get help right away if: You have redness, warmth, or pain in your leg. You have chest pain. You have problems breathing. These symptoms may represent a serious problem that is an emergency. Do not wait to see if the symptoms will go away. Get medical help right away. Call your local emergency services (911 in the [place] Do not drive yourself to the hospital. Summary Pain, swelling, and bruising are common after the procedure. Check your incision area every day for signs of infection, such as more redness, swelling, or pain. Put ice  on painful or swollen areas for 20 minutes, 2-3 times a day. This information is not intended to replace advice given to you by your health care provider. Make sure you discuss any questions you have with your health care provider. Document Revised: 02/11/2020 Document Reviewed: 02/11/2020 Elsevier Patient Education  2024 [employer]How to Use Chlorhexidine  at Home in the Shower Chlorhexidine  gluconate (CHG) is a germ-killing (antiseptic) wash that's used to clean the skin. It can get rid of the germs that normally live on the skin and can keep them away for about 24 hours. If you're having surgery, you may be told to shower with CHG at home the night before surgery. This can help lower your risk for infection. To use CHG wash in the shower, follow the steps below. Supplies needed: CHG body wash. Clean washcloth. Clean towel. How to use CHG in the shower Follow these steps unless you're told to use CHG in a different way: Start the shower. Use your  normal soap and shampoo to wash your face and hair. Turn off the shower or move out of the shower stream. Pour CHG onto a clean washcloth. Do not use any type of brush or rough sponge. Start at your neck, washing your body down to your toes. Make sure you: Wash the part of your body where the surgery will be done for at least 1 minute. Do not scrub. Do not use CHG on your head or face unless your health care provider tells you to. If it gets into your ears or eyes, rinse them well with water . Do not wash your genitals with CHG. Wash your back and under your arms. Make sure to wash skin folds. Let the CHG sit on your skin for 1-2 minutes or as long as told. Rinse your entire body in the shower, including all body creases and folds. Turn off the shower. Dry off with a clean towel. Do not put anything on your skin afterward, such as powder, lotion, or perfume. Put on clean clothes or pajamas. If it's the night before surgery, sleep in clean sheets. General tips Use CHG only as told, and follow the instructions on the label. Use the full amount of CHG as told. This is often one bottle. Do not smoke and stay away from flames after using CHG. Your skin may feel sticky after using CHG. This is normal. The sticky feeling will go away as the CHG dries. Do not use CHG: If you have a chlorhexidine  allergy or have reacted to chlorhexidine  in the past. On open wounds or areas of skin that have broken skin, cuts, or scrapes. On babies younger than 53 months of age. Contact a health care provider if: You have questions about using CHG. Your skin gets irritated or itchy. You have a rash after using CHG. You swallow any CHG. Call your local poison control center [phone] in the [place] Your eyes itch badly, or they become very red or swollen. Your hearing changes. You have trouble seeing. If you can't reach your provider, go to an urgent care or emergency room. Do not drive yourself. Get help  right away if: You have swelling or tingling in your mouth or throat. You make high-pitched whistling sounds when you breathe, most often when you breathe out (wheeze). You have trouble breathing. These symptoms may be an emergency. Call 911 right away. Do not wait to see if the symptoms will go away. Do not drive yourself to the hospital. This information is not  intended to replace advice given to you by your health care provider. Make sure you discuss any questions you have with your health care provider. Document Revised: 12/27/2022 Document Reviewed: 12/23/2021 Elsevier Patient Education  2024 [employer].

## 2023-08-03 ENCOUNTER — Encounter (HOSPITAL_COMMUNITY)
Admission: RE | Admit: 2023-08-03 | Discharge: 2023-08-03 | Disposition: A | Discharge status: Home / Self Care | Payer: Medicare HMO | Source: Ambulatory Visit | Attending: General Surgery | Admitting: General Surgery

## 2023-08-03 ENCOUNTER — Encounter (HOSPITAL_COMMUNITY): Payer: Self-pay

## 2023-08-03 HISTORY — DX: Family history of other specified conditions: Z84.89

## 2023-08-07 ENCOUNTER — Ambulatory Visit (HOSPITAL_COMMUNITY)
Admission: RE | Admit: 2023-08-07 | Discharge: 2023-08-07 | Disposition: A | Discharge status: Home / Self Care | Payer: Medicare HMO | Attending: General Surgery | Admitting: General Surgery

## 2023-08-07 ENCOUNTER — Ambulatory Visit (HOSPITAL_BASED_OUTPATIENT_CLINIC_OR_DEPARTMENT_OTHER): Payer: Medicare HMO | Admitting: Anesthesiology

## 2023-08-07 ENCOUNTER — Encounter (HOSPITAL_COMMUNITY)
Admission: RE | Disposition: A | Discharge status: Home / Self Care | Payer: Self-pay | Source: Home / Self Care | Attending: General Surgery

## 2023-08-07 ENCOUNTER — Encounter (HOSPITAL_COMMUNITY): Payer: Self-pay | Admitting: General Surgery

## 2023-08-07 ENCOUNTER — Other Ambulatory Visit: Payer: Self-pay

## 2023-08-07 ENCOUNTER — Ambulatory Visit (HOSPITAL_COMMUNITY): Payer: Medicare HMO | Admitting: Anesthesiology

## 2023-08-07 DIAGNOSIS — K219 Gastro-esophageal reflux disease without esophagitis: Secondary | ICD-10-CM | POA: Diagnosis not present

## 2023-08-07 DIAGNOSIS — Z87891 Personal history of nicotine dependence: Secondary | ICD-10-CM | POA: Diagnosis not present

## 2023-08-07 DIAGNOSIS — K4020 Bilateral inguinal hernia, without obstruction or gangrene, not specified as recurrent: Secondary | ICD-10-CM | POA: Diagnosis not present

## 2023-08-07 DIAGNOSIS — I10 Essential (primary) hypertension: Secondary | ICD-10-CM

## 2023-08-07 DIAGNOSIS — F419 Anxiety disorder, unspecified: Secondary | ICD-10-CM | POA: Insufficient documentation

## 2023-08-07 DIAGNOSIS — D176 Benign lipomatous neoplasm of spermatic cord: Secondary | ICD-10-CM | POA: Insufficient documentation

## 2023-08-07 DIAGNOSIS — I2510 Atherosclerotic heart disease of native coronary artery without angina pectoris: Secondary | ICD-10-CM | POA: Insufficient documentation

## 2023-08-07 DIAGNOSIS — G4730 Sleep apnea, unspecified: Secondary | ICD-10-CM | POA: Diagnosis not present

## 2023-08-07 DIAGNOSIS — F32A Depression, unspecified: Secondary | ICD-10-CM | POA: Insufficient documentation

## 2023-08-07 LAB — GLUCOSE, CAPILLARY: Glucose-Capillary: 129 mg/dL — ABNORMAL HIGH (ref 70–99)

## 2023-08-07 SURGERY — REPAIR, HERNIA, INGUINAL, BILATERAL, ROBOT-ASSISTED
Anesthesia: General | Site: Abdomen | Laterality: Bilateral

## 2023-08-07 MED ORDER — LIDOCAINE HCL (PF) 2 % IJ SOLN
INTRAMUSCULAR | Status: AC
  Filled 2023-08-07: qty 5

## 2023-08-07 MED ORDER — BUPIVACAINE HCL (PF) 0.5 % IJ SOLN
INTRAMUSCULAR | Status: AC
  Filled 2023-08-07: qty 30

## 2023-08-07 MED ORDER — FENTANYL CITRATE (PF) 250 MCG/5ML IJ SOLN
INTRAMUSCULAR | Status: AC
  Filled 2023-08-07: qty 5

## 2023-08-07 MED ORDER — MIDAZOLAM HCL 2 MG/2ML IJ SOLN
INTRAMUSCULAR | Status: AC
  Filled 2023-08-07: qty 2

## 2023-08-07 MED ORDER — PROPOFOL 10 MG/ML IV BOLUS
INTRAVENOUS | Status: AC
  Filled 2023-08-07: qty 20

## 2023-08-07 MED ORDER — DEXAMETHASONE SODIUM PHOSPHATE 10 MG/ML IJ SOLN
INTRAMUSCULAR | Status: DC | PRN
  Administered 2023-08-07: 5 mg via INTRAVENOUS

## 2023-08-07 MED ORDER — LIDOCAINE HCL (PF) 2 % IJ SOLN
INTRAMUSCULAR | Status: DC | PRN
  Administered 2023-08-07: 60 mg via INTRADERMAL

## 2023-08-07 MED ORDER — ONDANSETRON HCL 4 MG PO TABS: 4.0000 mg | ORAL_TABLET | Freq: Three times a day (TID) | ORAL | 1 refills | Status: DC | PRN

## 2023-08-07 MED ORDER — PROPOFOL 10 MG/ML IV BOLUS
INTRAVENOUS | Status: DC | PRN
  Administered 2023-08-07: 160 mg via INTRAVENOUS

## 2023-08-07 MED ORDER — ONDANSETRON HCL 4 MG/2ML IJ SOLN
INTRAMUSCULAR | Status: AC
  Filled 2023-08-07: qty 2

## 2023-08-07 MED ORDER — KETOROLAC TROMETHAMINE 30 MG/ML IJ SOLN
INTRAMUSCULAR | Status: DC | PRN
  Administered 2023-08-07: 30 mg via INTRAVENOUS

## 2023-08-07 MED ORDER — OXYCODONE HCL 5 MG/5ML PO SOLN
5.0000 mg | Freq: Once | ORAL | Status: DC | PRN
Start: 2023-08-07 — End: 2023-08-07

## 2023-08-07 MED ORDER — STERILE WATER FOR IRRIGATION IR SOLN
Status: DC | PRN
  Administered 2023-08-07: 500 mL

## 2023-08-07 MED ORDER — PHENYLEPHRINE 80 MCG/ML (10ML) SYRINGE FOR IV PUSH (FOR BLOOD PRESSURE SUPPORT)
PREFILLED_SYRINGE | INTRAVENOUS | Status: AC
  Filled 2023-08-07: qty 10

## 2023-08-07 MED ORDER — ORAL CARE MOUTH RINSE: 15.0000 mL | Freq: Once | OROMUCOSAL | Status: AC

## 2023-08-07 MED ORDER — BUPIVACAINE HCL (PF) 0.5 % IJ SOLN
INTRAMUSCULAR | Status: DC | PRN
  Administered 2023-08-07: 30 mL

## 2023-08-07 MED ORDER — CHLORHEXIDINE GLUCONATE 0.12 % MT SOLN
15.0000 mL | Freq: Once | OROMUCOSAL | Status: AC
  Administered 2023-08-07: 15 mL via OROMUCOSAL

## 2023-08-07 MED ORDER — ROCURONIUM BROMIDE 10 MG/ML (PF) SYRINGE
PREFILLED_SYRINGE | INTRAVENOUS | Status: AC
  Filled 2023-08-07: qty 10

## 2023-08-07 MED ORDER — SUGAMMADEX SODIUM 200 MG/2ML IV SOLN
INTRAVENOUS | Status: DC | PRN
  Administered 2023-08-07: 200 mg via INTRAVENOUS

## 2023-08-07 MED ORDER — ONDANSETRON HCL 4 MG/2ML IJ SOLN
4.0000 mg | Freq: Once | INTRAMUSCULAR | Status: AC | PRN
  Administered 2023-08-07: 4 mg via INTRAVENOUS
  Filled 2023-08-07: qty 2

## 2023-08-07 MED ORDER — PROPOFOL 500 MG/50ML IV EMUL
INTRAVENOUS | Status: AC
  Filled 2023-08-07: qty 150

## 2023-08-07 MED ORDER — LACTATED RINGERS IV SOLN: INTRAVENOUS | Status: DC

## 2023-08-07 MED ORDER — FENTANYL CITRATE (PF) 250 MCG/5ML IJ SOLN
INTRAMUSCULAR | Status: DC | PRN
  Administered 2023-08-07 (×2): 50 ug via INTRAVENOUS

## 2023-08-07 MED ORDER — DEXAMETHASONE SODIUM PHOSPHATE 10 MG/ML IJ SOLN
INTRAMUSCULAR | Status: AC
  Filled 2023-08-07: qty 1

## 2023-08-07 MED ORDER — ONDANSETRON HCL 4 MG/2ML IJ SOLN
INTRAMUSCULAR | Status: AC
Start: 2023-08-07 — End: ?
  Filled 2023-08-07: qty 2

## 2023-08-07 MED ORDER — CHLORHEXIDINE GLUCONATE CLOTH 2 % EX PADS
6.0000 | MEDICATED_PAD | Freq: Once | CUTANEOUS | Status: AC
  Administered 2023-08-07: 6 via TOPICAL

## 2023-08-07 MED ORDER — KETOROLAC TROMETHAMINE 30 MG/ML IJ SOLN
INTRAMUSCULAR | Status: AC
  Filled 2023-08-07: qty 1

## 2023-08-07 MED ORDER — OXYCODONE HCL 5 MG PO TABS: 5.0000 mg | ORAL_TABLET | ORAL | 0 refills | Status: DC | PRN

## 2023-08-07 MED ORDER — SEVOFLURANE IN SOLN
RESPIRATORY_TRACT | Status: AC
  Filled 2023-08-07: qty 250

## 2023-08-07 MED ORDER — ROCURONIUM BROMIDE 10 MG/ML (PF) SYRINGE
PREFILLED_SYRINGE | INTRAVENOUS | Status: DC | PRN
Start: 2023-08-07 — End: 2023-08-07
  Administered 2023-08-07 (×2): 20 mg via INTRAVENOUS
  Administered 2023-08-07: 60 mg via INTRAVENOUS

## 2023-08-07 MED ORDER — OXYCODONE HCL 5 MG PO TABS: 5.0000 mg | ORAL_TABLET | Freq: Once | ORAL | Status: DC | PRN

## 2023-08-07 MED ORDER — FENTANYL CITRATE (PF) 100 MCG/2ML IJ SOLN
INTRAMUSCULAR | Status: AC
  Filled 2023-08-07: qty 2

## 2023-08-07 MED ORDER — CEFAZOLIN SODIUM-DEXTROSE 2-4 GM/100ML-% IV SOLN
2.0000 g | INTRAVENOUS | Status: AC
Start: 2023-08-07 — End: 2023-08-07
  Administered 2023-08-07: 2 g via INTRAVENOUS
  Filled 2023-08-07: qty 100

## 2023-08-07 MED ORDER — FENTANYL CITRATE PF 50 MCG/ML IJ SOSY
25.0000 ug | PREFILLED_SYRINGE | INTRAMUSCULAR | Status: DC | PRN
  Administered 2023-08-07 (×2): 50 ug via INTRAVENOUS
  Filled 2023-08-07 (×2): qty 1

## 2023-08-07 MED ORDER — ONDANSETRON HCL 4 MG/2ML IJ SOLN
INTRAMUSCULAR | Status: DC | PRN
  Administered 2023-08-07: 4 mg via INTRAVENOUS

## 2023-08-07 MED ORDER — CHLORHEXIDINE GLUCONATE CLOTH 2 % EX PADS: 6.0000 | MEDICATED_PAD | Freq: Once | CUTANEOUS | Status: DC

## 2023-08-07 SURGICAL SUPPLY — 46 items
BLADE SURG 15 STRL LF DISP TIS (BLADE) ×1 IMPLANT
CHLORAPREP W/TINT 26 (MISCELLANEOUS) ×1 IMPLANT
COVER LIGHT HANDLE STERIS (MISCELLANEOUS) ×2 IMPLANT
COVER MAYO STAND XLG (MISCELLANEOUS) ×1 IMPLANT
COVER TIP SHEARS 8 DVNC (MISCELLANEOUS) ×1 IMPLANT
DERMABOND ADVANCED .7 DNX12 (GAUZE/BANDAGES/DRESSINGS) ×1 IMPLANT
DRAPE ARM DVNC X/XI (DISPOSABLE) ×3 IMPLANT
DRAPE COLUMN DVNC XI (DISPOSABLE) ×1 IMPLANT
DRIVER NDL MEGA SUTCUT DVNCXI (INSTRUMENTS) ×1 IMPLANT
DRIVER NDLE MEGA SUTCUT DVNCXI (INSTRUMENTS) ×1 IMPLANT
ELECT REM PT RETURN 9FT ADLT (ELECTROSURGICAL) ×1 IMPLANT
ELECTRODE REM PT RTRN 9FT ADLT (ELECTROSURGICAL) ×1 IMPLANT
FORCEPS BPLR R/ABLATION 8 DVNC (INSTRUMENTS) ×1 IMPLANT
GAUZE SPONGE 4X4 12PLY STRL (GAUZE/BANDAGES/DRESSINGS) ×1 IMPLANT
GLOVE BIO SURGEON STRL SZ 6.5 (GLOVE) ×2 IMPLANT
GLOVE BIOGEL PI IND STRL 6.5 (GLOVE) ×2 IMPLANT
GLOVE BIOGEL PI IND STRL 7.0 (GLOVE) ×3 IMPLANT
GOWN STRL REUS W/TWL LRG LVL3 (GOWN DISPOSABLE) ×3 IMPLANT
KIT PINK PAD W/HEAD ARE REST (MISCELLANEOUS) ×1 IMPLANT
KIT PINK PAD W/HEAD ARM REST (MISCELLANEOUS) ×1 IMPLANT
KIT TURNOVER KIT A (KITS) ×1 IMPLANT
MANIFOLD NEPTUNE II (INSTRUMENTS) ×1 IMPLANT
MESH 3DMAX MID 5X7 LT XLRG (Mesh General) ×1 IMPLANT
MESH 3DMAX MID 5X7 RT XLRG (Mesh General) ×1 IMPLANT
NDL HYPO 21X1.5 SAFETY (NEEDLE) ×1 IMPLANT
NDL INSUFFLATION 14GA 120MM (NEEDLE) ×1 IMPLANT
NEEDLE HYPO 21X1.5 SAFETY (NEEDLE) ×1 IMPLANT
NEEDLE INSUFFLATION 14GA 120MM (NEEDLE) ×1 IMPLANT
OBTURATOR OPTICAL STND 8 DVNC (TROCAR) ×1 IMPLANT
OBTURATOR OPTICALSTD 8 DVNC (TROCAR) ×1 IMPLANT
PACK LAP CHOLE LZT030E (CUSTOM PROCEDURE TRAY) ×1 IMPLANT
PENCIL HANDSWITCHING (ELECTRODE) ×1 IMPLANT
POSITIONER HEAD 8X9X4 ADT (SOFTGOODS) ×1 IMPLANT
SCISSORS MNPLR CVD DVNC XI (INSTRUMENTS) ×1 IMPLANT
SEAL UNIV 5-12 XI (MISCELLANEOUS) ×3 IMPLANT
SOL PREP POV-IOD 4OZ 10% (MISCELLANEOUS) ×1 IMPLANT
SUT MNCRL AB 4-0 PS2 18 (SUTURE) ×1 IMPLANT
SUT STRATA 3-0 SH (SUTURE) ×4 IMPLANT
SUT VIC AB 2-0 SH 27X BRD (SUTURE) ×1 IMPLANT
SUT VICRYL 0 AB UR-6 (SUTURE) ×1 IMPLANT
SYR 30ML LL (SYRINGE) ×1 IMPLANT
SYS RETRIEVAL 5MM INZII UNIV (BASKET) ×1 IMPLANT
SYSTEM RETRIEVL 5MM INZII UNIV (BASKET) IMPLANT
TAPE TRANSPORE STRL 2 31045 (GAUZE/BANDAGES/DRESSINGS) ×1 IMPLANT
TRAY FOL W/BAG SLVR 16FR STRL (SET/KITS/TRAYS/PACK) ×1 IMPLANT
WATER STERILE IRR 500ML POUR (IV SOLUTION) ×1 IMPLANT

## 2023-08-07 NOTE — Anesthesia Procedure Notes (Signed)
 Procedure Name: Intubation Date/Time: 08/07/2023 10:36 AM  Performed by: Verline Glow, CRNAPre-anesthesia Checklist: Patient identified, Patient being monitored, Timeout performed, Emergency Drugs available and Suction available Patient Re-evaluated:Patient Re-evaluated prior to induction Oxygen Delivery Method: Circle System Utilized Preoxygenation: Pre-oxygenation with 100% oxygen Induction Type: IV induction Ventilation: Mask ventilation without difficulty Laryngoscope Size: Mac Grade View: Grade I Tube type: Oral Tube size: 7.5 mm Number of attempts: 1 Airway Equipment and Method: Stylet Placement Confirmation: ETT inserted through vocal cords under direct vision, positive ETCO2 and breath sounds checked- equal and bilateral Secured at: 21 cm Tube secured with: Tape Dental Injury: Teeth and Oropharynx as per pre-operative assessment

## 2023-08-07 NOTE — Interval H&P Note (Signed)
 History and Physical Interval Note:  08/07/2023 10:12 AM  Jeffrey Stokes  has presented today for surgery, with the diagnosis of INGUINAL HERNIA, BILATERAL.  The various methods of treatment have been discussed with the patient and family. After consideration of risks, benefits and other options for treatment, the patient has consented to  Procedure(s): XI ROBOTIC ASSISTED BILATERAL INGUINAL HERNIA REPAIR W. MESH (Bilateral) as a surgical intervention.  The patient's history has been reviewed, patient examined, no change in status, stable for surgery.  I have reviewed the patient's chart and labs.  Questions were answered to the patient's satisfaction.     Awilda Bogus

## 2023-08-07 NOTE — Progress Notes (Signed)
 [facility]  Updated his family. Rx to [facility]. Will need to urinate prior to leaving today.   Will see in 4 weeks.  Deena Farrier, MD [facility] [address] [place], [place] [address] [phone] (office)

## 2023-08-07 NOTE — Anesthesia Preprocedure Evaluation (Signed)
 Anesthesia Evaluation  Patient identified by MRN, date of birth, ID band Patient awake    Reviewed: Allergy & Precautions, H&P , NPO status , Patient's Chart, lab work & pertinent test results, reviewed documented beta blocker date and time   History of Anesthesia Complications (+) Family history of anesthesia reaction  Airway Mallampati: II  TM Distance: >3 FB Neck ROM: full    Dental no notable dental hx. (+) Edentulous Upper, Edentulous Lower   Pulmonary neg pulmonary ROS, sleep apnea , former smoker   Pulmonary exam normal breath sounds clear to auscultation       Cardiovascular Exercise Tolerance: Good hypertension, + CAD  negative cardio ROS  Rhythm:regular Rate:Normal     Neuro/Psych  PSYCHIATRIC DISORDERS Anxiety Depression     Neuromuscular disease CVA negative neurological ROS  negative psych ROS   GI/Hepatic negative GI ROS, Neg liver ROS,GERD  ,,  Endo/Other  negative endocrine ROS    Renal/GU negative Renal ROS  negative genitourinary   Musculoskeletal   Abdominal   Peds  Hematology negative hematology ROS (+)   Anesthesia Other Findings   Reproductive/Obstetrics negative OB ROS                             Anesthesia Physical Anesthesia Plan  ASA: 3  Anesthesia Plan: General and General ETT   Post-op Pain Management:    Induction:   PONV Risk Score and Plan: Ondansetron   Airway Management Planned:   Additional Equipment:   Intra-op Plan:   Post-operative Plan:   Informed Consent: I have reviewed the patients History and Physical, chart, labs and discussed the procedure including the risks, benefits and alternatives for the proposed anesthesia with the patient or authorized representative who has indicated his/her understanding and acceptance.     Dental Advisory Given  Plan Discussed with: CRNA  Anesthesia Plan Comments:        Anesthesia Quick  Evaluation

## 2023-08-07 NOTE — Transfer of Care (Signed)
 Immediate Anesthesia Transfer of Care Note  Patient: Jeffrey Stokes  Procedure(s) Performed: XI ROBOTIC ASSISTED BILATERAL INGUINAL HERNIA REPAIR W. MESH (Bilateral: Abdomen)  Patient Location: PACU  Anesthesia Type:General  Level of Consciousness: drowsy and patient cooperative  Airway & Oxygen Therapy: Patient Spontanous Breathing and Patient connected to nasal cannula oxygen  Post-op Assessment: Report given to RN and Post -op Vital signs reviewed and stable  Post vital signs: Reviewed and stable  Last Vitals:  Vitals Value Taken Time  BP 127/86 08/07/23 1310  Temp 98.6 08/07/2023  1312  Pulse 71 08/07/23 1311  Resp 14 08/07/23 1311  SpO2 95 % 08/07/23 1311  Vitals shown include unfiled device data.  Last Pain:  Vitals:   08/07/23 0758  TempSrc: Oral  PainSc: 4          Complications: No notable events documented.

## 2023-08-07 NOTE — Op Note (Signed)
 [facility] Operative Note  08/07/23  Preoperative Diagnosis: Bilateral inguinal Hernia   Postoperative Diagnosis: Same   Procedure(s) Performed: Robotic assisted laparoscopic bilateral inguinal hernia repair with mesh   Surgeon: Dixon Fredrickson. Collene Dawson, MD   Assistants: No qualified resident was available    Anesthesia: General endotracheal   Anesthesiologist: Dr. Adan Holms    Specimens: None   Estimated Blood Loss: Minimal   Blood Replacement: None    Complications: None   Wound Class:Clean    Operative Indications: The patient has a bilateral inguinal hernia that is symptomatic and they want repaired. We discussed robotic assisted laparoscopic inguinal hernia repair and risk of bleeding, infection, issues with chronic pain post operatively, use of mesh, risk of recurrence, chance of needing to repair a bilateral hernia, risk of injury to bowel or bladder, and risk of injury to cord structures for male patients.   Findings: Vas Deferens and cord structures identified and preserved Bard 3D Max Medium Weight Mesh, XL size X2 Hemostasis achieved   Procedure: The patient was taken to the operating room and placed supine. General endotracheal anesthesia was induced. Intravenous antibiotics were administered per protocol.  A foley catheter was placed and a orogastric tube positioned to decompress the stomach. The abdomen was prepared and draped in the usual sterile fashion.   An incision was marked 20 cm above the pubic tubercle, slightly above the umbilicus. Veress needle inserted at palmer's point.  Saline drop test noted to be positive with gradual increase in pressure after initiation of gas insufflation.  15 mm of pressure was achieved prior to removing the Veress needle and then placing a 8 mm port via the Optiview technique through the supraumbilical site that had been previously marked.  Inspection of the area afterwards noted no injury to the surrounding  organs during insertion of the needle and the port.  2 port sites were marked 8 cm to the lateral sides of the initial port, and a 8 mm robotic port was placed on the left side, another 8 mm robotic port on the right side under direct supervision. The [employer] was then brought into the operative field and docked to the ports.  Examination of the abdominal cavity noted a bilateral inguinal hernias and some omentum stuck to the anterior abdominal wall. Using cautery and scissors the omentum was lysed from the anterior abdominal wall.     I started on the right as this was the most symptomatic hernia. A peritoneal flap was created approximately 6cm cephalad to the defect by using scissors with electrocautery.  Dissection was carried down towards the pubic tubercle, developing the myopectineal orifice view. Laterally the flap was carried towards the ASIS.  An indirect hernia sac was noted, which carefully dissected away from the adjacent tissues to be fully reduced out of hernia cavity.  Any bleeding was controlled with combination of electrocautery and manual pressure.   After confirming adequate dissection and the peritoneal reflection completely down and away from the cord structures at the deep ring, a XL Bard 3DMax Medium weight mesh was placed within the anterior abdominal wall and secured in place using 2-0 Vicryl immediately above the pubic tubercle and superior on the abdominal wall ensuring no vessels or nerves were incorporated.  After noting proper placement of the mesh with the peritoneal reflection deep to it, the previously created peritoneal flap was secured back up to the anterior abdominal wall using running 3-0 V-Lock. Any holes created in the peritoneal flap were closed.  All needles were then removed out of the abdominal cavity.   I then turned to the left sided hernia. A peritoneal flap was created approximately 6cm cephalad to the defect by using scissors with electrocautery.   Dissection was carried down towards the pubic tubercle, developing the myopectineal orifice view. Laterally the flap was carried towards the ASIS.  An indirect hernia sac was noted, which carefully dissected away from the adjacent tissues to be fully reduced out of hernia cavity. A cord lipoma was removed. Any bleeding was controlled with combination of electrocautery and manual pressure.   After confirming adequate dissection and the peritoneal reflection completely down and away from the cord structures at the deep ring, a XL Bard 3DMax Medium weight mesh was placed within the anterior abdominal wall and secured in place using 2-0 Vicryl immediately above the pubic tubercle and superior on the abdominal wall ensuring no vessels or nerves were incorporated.  After noting proper placement of the mesh with the peritoneal reflection deep to it, the previously created peritoneal flap was secured back up to the anterior abdominal wall using running 3-0 V-Lock. Any holes created in the peritoneal flap were closed a 2-0 Vicryl. All needles were then removed out of the abdominal cavity. The cord lipoma was placed in a bag and removed from the left side trocar.   The robot platform was undocked from the ports and removed off of operative field. Marcaine  was infused as ilioinguinal block bilaterally and at the port sites.   The abdomen was then desufflated and ports removed. All skin incisions were closed with a subcuticular stitch of Monocryl 4-0. Dermabond was applied. The testis was gently pulled down into its anatomic position in the scrotum.  Final inspection revealed acceptable hemostasis. All counts were correct at the end of the case. The patient was awakened from anesthesia and extubated without complication.  The patient went to the PACU in stable condition.   Deena Farrier, MD [employer] [address] [place], [place] [address] [phone] (office)

## 2023-08-07 NOTE — Discharge Instructions (Signed)
Discharge Robotic Assisted Laparoscopic Inguinal Hernia Surgery Instructions:  Common Complaints: Shoulder pain is common after laparoscopic surgery.  This is secondary to the gas used in the surgery being trapped under the diaphragm.  Walk to help your body absorb the gas. This will improve in a few days. Pain at the port sites are common, especially the larger port sites. This will improve with time.  Some nausea is common and poor appetite. The main goal is to stay hydrated the first few days after surgery.  You can ice the port sites and the groin as needed for the first 72 hours.  Bruising in the side of the repair and down into the groin can occur, if the bruising is spreading, call the office.    For Males: Some swelling and bruising down into the scrotum is normal, place a towel under your scrotum (folded into a square) to help with elevation of the scrotum and swelling. You may feel some residual gas used for the surgery in the scrotum. This will absorb with time.  Men will need to urinate in the post operative area prior to being released home. This is to prevent you from retaining urine in your bladder and needing a visit the emergency department later in the day/evening.   Diet/ Activity: Diet as tolerated. You may not have an appetite, but it is important to stay hydrated.  Drink 64 ounces of water a day. Your appetite will return with time.  Shower per your regular routine daily.  Do not take hot showers. Take warm showers that are less than 10 minutes as long may cause the glue to peel.  Rest and listen to your body, but do not remain in bed all day.  Walk everyday for at least 15-20 minutes. Deep cough and move around every 1-2 hours in the first few days after surgery.  Do not lift > 10 lbs, perform excessive bending, pushing, pulling, squatting for 4-6 weeks after surgery.  Do not pick at the dermabond glue on your incision sites.  This glue film will remain in place for 1-2  weeks and will start to peel off.  Do not place lotions or balms on your incision unless instructed to specifically by Dr. Constance Haw.   Pain Expectations and Narcotics: -After surgery you will have pain associated with your incisions and this is normal. The pain is muscular and nerve pain, and will get better with time. -You are encouraged and expected to take non narcotic medications like tylenol and ibuprofen (when able) to treat pain as multiple modalities can aid with pain treatment. -Narcotics are only used when pain is severe or there is breakthrough pain. -You are not expected to have a pain score of 0 after surgery, as we cannot prevent pain. A pain score of 3-4 that allows you to be functional, move, walk, and tolerate some activity is the goal. The pain will continue to improve over the days after surgery and is dependent on your surgery. -Due to [place] law, we are only able to give a certain amount of pain medication to treat post operative pain, and we only give additional narcotics on a patient by patient basis.  -For most laparoscopic surgery, studies have shown that the majority of patients only need 10-15 narcotic pills, and for open surgeries most patients only need 15-20.   -Having appropriate expectations of pain and knowledge of pain management with non narcotics is important as we do not want anyone to become addicted to  narcotic pain medication.  -Using ice packs in the first 48 hours and heating pads after 48 hours, wearing an abdominal binder (when recommended), and using over the counter medications are all ways to help with pain management.   -Simple acts like meditation and mindfulness practices after surgery can also help with pain control and research has proven the benefit of these practices.  Medication: Take tylenol and ibuprofen as needed for pain control, alternating every 4-6 hours.  Example:  Tylenol 1069m @ 6am, 12noon, 6pm, 181mnight (Do not exceed 400037mf tylenol  a day). Ibuprofen 800m55m9am, 3pm, 9pm, 3am (Do not exceed 3600mg83mibuprofen a day).  Take Roxicodone for breakthrough pain every 4 hours.  Take Colace for constipation related to narcotic pain medication. If you do not have a bowel movement in 2 days, take Miralax over the counter.  Drink plenty of water to also prevent constipation.   Contact Information: If you have questions or concerns, please call our office, [phone]- Thursday 8AM-5PM and Friday 8AM-12Noon.  If it is after hours or on the weekend, please call [facility], [phone] ask to speak to the surgeon on call for Dr[facility]

## 2023-08-07 NOTE — Progress Notes (Signed)
 Patient more awake now.  Patient able to void moderate amount clear yellow urine.  D/c instructions explained to patient and his wife, Jeffrey Stokes.  Patient d/c home, wife is driving.

## 2023-08-07 NOTE — Progress Notes (Signed)
 Patient is slow to wake up.  Patient is arousable and talks a little, then mumbles and goes back to sleep.  Patient is a little sweaty.  CBG was 129 at 1409.  Patient states he is nauseous, zofran  4mg  IV given at 1406 also.  Last dose of 50mcg fentanyl  was at 1342.  Vital signs remain stable, HR 68, last bp 117/80. 93% on room air.  3 Incisions to abdomen are dry and intact, dermabond in place.

## 2023-08-08 NOTE — Anesthesia Postprocedure Evaluation (Signed)
Anesthesia Post Note  Patient: Jeffrey Stokes  Procedure(s) Performed: XI ROBOTIC ASSISTED BILATERAL INGUINAL HERNIA REPAIR W. MESH (Bilateral: Abdomen)  Patient location during evaluation: PACU Anesthesia Type: General Level of consciousness: awake and alert Pain management: pain level controlled Vital Signs Assessment: post-procedure vital signs reviewed and stable Respiratory status: spontaneous breathing, nonlabored ventilation and respiratory function stable Cardiovascular status: blood pressure returned to baseline and stable Postop Assessment: no apparent nausea or vomiting Anesthetic complications: no   No notable events documented.   Last Vitals:  Vitals:   08/07/23 1459 08/07/23 1515  BP: 99/72 108/75  Pulse: 62   Resp: 20   Temp: 36.4 C   SpO2: 95%     Last Pain:  Vitals:   08/07/23 1459  TempSrc:   PainSc: 4                  Roslynn Amble

## 2023-08-30 ENCOUNTER — Encounter: Payer: Medicare HMO | Admitting: General Surgery

## 2023-09-05 ENCOUNTER — Encounter: Payer: Self-pay | Admitting: General Surgery

## 2023-09-05 ENCOUNTER — Ambulatory Visit (INDEPENDENT_AMBULATORY_CARE_PROVIDER_SITE_OTHER): Payer: Medicare HMO | Admitting: General Surgery

## 2023-09-05 VITALS — BP 135/85 | HR 60 | Temp 97.5°F | Resp 14 | Ht 72.0 in | Wt 216.0 lb

## 2023-09-05 DIAGNOSIS — K4020 Bilateral inguinal hernia, without obstruction or gangrene, not specified as recurrent: Secondary | ICD-10-CM

## 2023-09-05 DIAGNOSIS — R1031 Right lower quadrant pain: Secondary | ICD-10-CM

## 2023-09-05 DIAGNOSIS — G8918 Other acute postprocedural pain: Secondary | ICD-10-CM

## 2023-09-05 NOTE — Patient Instructions (Signed)
 See how the hypersensitivity improves in the next 4 weeks. If this is improving in 2 weeks you can start lifting <40 lbs. Do not lift more than that until you talk to me on the phone. Gabapentin is an option for treating this hypersensitivity "nerve pain."

## 2023-09-05 NOTE — Progress Notes (Unsigned)
 [facility]  Having some hypersensitivity pain in the groin area on the right.  The right side was more painful to begin with.   BP 135/85   Pulse 60   Temp (!) 97.5 F (36.4 C) (Oral)   Resp 14   Ht 6' (1.829 m)   Wt 216 lb (98 kg)   SpO2 95%   BMI 29.29 kg/m  Port site c/d/I with no erythema or drainage Glue peeling off Groin without obvious recurrence, no bruising   Patient s/p robotic assisted bilateral inguinal hernia. Doing fair. Having hypersensitivity but otherwise well. Not taking any narcotic med.   See how the hypersensitivity improves in the next 4 weeks. If this is improving in 2 weeks you can start lifting <40 lbs. Do not lift more than that until you talk to me on the phone. Gabapentin is an option for treating this hypersensitivity "nerve pain."  Future Appointments  Date Time Provider Department Center  10/04/2023  3:45 PM Lucretia Roers, MD [facility] None  03/20/2024  8:15 AM PCM-ANNUAL WELLNESS VISIT [facility]   Algis Greenhouse, MD [facility] [address] [place] [place], [place] [address] [phone] (office)

## 2023-10-04 ENCOUNTER — Encounter: Admitting: General Surgery

## 2023-10-05 ENCOUNTER — Ambulatory Visit (INDEPENDENT_AMBULATORY_CARE_PROVIDER_SITE_OTHER): Admitting: General Surgery

## 2023-10-05 DIAGNOSIS — R1031 Right lower quadrant pain: Secondary | ICD-10-CM

## 2023-10-05 DIAGNOSIS — R1032 Left lower quadrant pain: Secondary | ICD-10-CM

## 2023-10-05 DIAGNOSIS — G8918 Other acute postprocedural pain: Secondary | ICD-10-CM

## 2023-10-05 NOTE — Progress Notes (Signed)
 [facility]  I am calling the patient for post operative evaluation. This is not a billable encounter as it is under the global charges for the surgery.  The patient had a robotic bilateral inguinal hernia on February 2025. The patient reports he is monitoring the discomfort and it is both left and right and he is giving the issue time. He says that the right size near the testicle feels like pulling and the left side feels like he has a "finger poking him constantly." He does not feel like it has gotten better.  Will plan to see him in person in 6 weeks. Will have the office call him with he appt.   Algis Greenhouse, MD [facility] [address], [place] [address] [phone] (office)

## 2023-11-15 ENCOUNTER — Ambulatory Visit (INDEPENDENT_AMBULATORY_CARE_PROVIDER_SITE_OTHER): Admitting: General Surgery

## 2023-11-15 ENCOUNTER — Encounter: Payer: Self-pay | Admitting: General Surgery

## 2023-11-15 ENCOUNTER — Other Ambulatory Visit: Payer: Self-pay

## 2023-11-15 VITALS — BP 122/74 | HR 56 | Temp 97.4°F | Resp 16 | Ht 72.0 in | Wt 204.0 lb

## 2023-11-15 DIAGNOSIS — R1032 Left lower quadrant pain: Secondary | ICD-10-CM

## 2023-11-15 DIAGNOSIS — I6523 Occlusion and stenosis of bilateral carotid arteries: Secondary | ICD-10-CM

## 2023-11-15 MED ORDER — ATORVASTATIN CALCIUM 10 MG PO TABS: 10.0000 mg | ORAL_TABLET | Freq: Every day | ORAL | 0 refills | Status: DC

## 2023-11-15 NOTE — Progress Notes (Signed)
 [facility]  Doing better but still with some left sided pain with activity. He also has some hypersensitivity to the right testicle.   Improving some but not completely resolved.  BP 122/74   Pulse (!) 56   Temp (!) 97.4 F (36.3 C) (Oral)   Resp 16   Ht 6' (1.829 m)   Wt 204 lb (92.5 kg)   SpO2 96%   BMI 27.67 kg/m  Soft abd, no signs of recurrence on left or right, right testicle with normal size and shape, some tenderness inferior, no pain in the cord structure  Patient s/p robotic bilateral inguinal hernia repair. With some post operative discomfort still. 3 months out. Getting better but not resolved. No signs of recurrence.  Offered gabapentin  but say\s he does not tolerate it Will continue to monitor Activity as tolerated  Will call if issues in August 6 months out. Will start with ultrasound to look at area if needed.  Deena Farrier, MD [facility] [address] [place], [place] [address] [phone] (office)

## 2023-11-17 ENCOUNTER — Ambulatory Visit (INDEPENDENT_AMBULATORY_CARE_PROVIDER_SITE_OTHER): Admitting: Internal Medicine

## 2023-11-17 ENCOUNTER — Encounter: Payer: Self-pay | Admitting: Internal Medicine

## 2023-11-17 VITALS — BP 116/72 | HR 86 | Ht 72.0 in | Wt 205.0 lb

## 2023-11-17 DIAGNOSIS — E782 Mixed hyperlipidemia: Secondary | ICD-10-CM

## 2023-11-17 DIAGNOSIS — I779 Disorder of arteries and arterioles, unspecified: Secondary | ICD-10-CM | POA: Diagnosis not present

## 2023-11-17 DIAGNOSIS — I2510 Atherosclerotic heart disease of native coronary artery without angina pectoris: Secondary | ICD-10-CM | POA: Diagnosis not present

## 2023-11-17 DIAGNOSIS — Z0000 Encounter for general adult medical examination without abnormal findings: Secondary | ICD-10-CM | POA: Diagnosis not present

## 2023-11-17 DIAGNOSIS — N401 Enlarged prostate with lower urinary tract symptoms: Secondary | ICD-10-CM

## 2023-11-17 DIAGNOSIS — I69359 Hemiplegia and hemiparesis following cerebral infarction affecting unspecified side: Secondary | ICD-10-CM

## 2023-11-17 DIAGNOSIS — J069 Acute upper respiratory infection, unspecified: Secondary | ICD-10-CM

## 2023-11-17 DIAGNOSIS — B3742 Candidal balanitis: Secondary | ICD-10-CM

## 2023-11-17 DIAGNOSIS — N138 Other obstructive and reflux uropathy: Secondary | ICD-10-CM

## 2023-11-17 DIAGNOSIS — K219 Gastro-esophageal reflux disease without esophagitis: Secondary | ICD-10-CM

## 2023-11-17 MED ORDER — FLUCONAZOLE 100 MG PO TABS: 100.0000 mg | ORAL_TABLET | Freq: Every day | ORAL | 0 refills | Status: AC

## 2023-11-17 NOTE — Progress Notes (Signed)
 Date:  11/17/2023   Name:  Jeffrey Stokes   DOB:  1960-09-20   MRN:  [id number]   Chief Complaint: Annual Exam and Ear Pain (Woke up with ear pain today) Jeffrey Stokes is a 63 y.o. male who presents today for his Complete Annual Exam. He feels fairly well. He reports exercising none. He reports he is sleeping fairly well.   Health Maintenance  Topic Date Due   Zoster (Shingles) Vaccine (1 of 2) 02/17/2024*   Pneumococcal Vaccination (1 of 2 - PCV) 11/16/2024*   Flu Shot  01/26/2024   Medicare Annual Wellness Visit  03/14/2024   Colon Cancer Screening  02/20/2028   DTaP/Tdap/Td vaccine (2 - Td or Tdap) 12/01/2031   Hepatitis C Screening  Addressed   HIV Screening  Addressed   HPV Vaccine  Aged Out   Meningitis B Vaccine  Aged Out   COVID-19 Vaccine  Discontinued  *Topic was postponed. The date shown is not the original due date.    Lab Results  Component Value Date   PSA1 0.5 05/17/2023   PSA1 0.6 11/17/2022   PSA1 0.8 11/30/2021    Hyperlipidemia This is a chronic problem. The problem is controlled. Pertinent negatives include no chest pain or shortness of breath. The current treatment provides significant improvement of lipids.  Gastroesophageal Reflux He complains of heartburn and a sore throat. He reports no abdominal pain, no chest pain, no coughing or no wheezing. This is a recurrent problem. The problem occurs rarely. Pertinent negatives include no fatigue. He has tried a PPI for the symptoms. The treatment provided significant relief.  URI  This is a new problem. The current episode started today. Associated symptoms include a plugged ear sensation, a rash and a sore throat. Pertinent negatives include no abdominal pain, chest pain, coughing, diarrhea, dysuria, headaches or wheezing. He has tried nothing for the symptoms.    Review of Systems  Constitutional:  Negative for fatigue and unexpected weight change.  HENT:  Positive for sore throat. Negative for nosebleeds.    Eyes:  Negative for visual disturbance.  Respiratory:  Negative for cough, chest tightness, shortness of breath and wheezing.   Cardiovascular:  Negative for chest pain, palpitations and leg swelling.  Gastrointestinal:  Positive for heartburn. Negative for abdominal pain, constipation and diarrhea.  Genitourinary:  Positive for genital sores (balanitis not improved with topicals) and testicular pain. Negative for dysuria.  Musculoskeletal:  Negative for arthralgias and joint swelling.  Skin:  Positive for rash.  Neurological:  Positive for weakness. Negative for dizziness, light-headedness and headaches.  Psychiatric/Behavioral:  Negative for dysphoric mood and sleep disturbance. The patient is not nervous/anxious.      Lab Results  Component Value Date   NA 141 03/10/2023   K 4.5 03/10/2023   CO2 22 03/10/2023   GLUCOSE 99 03/10/2023   BUN 13 03/10/2023   CREATININE 0.97 03/10/2023   CALCIUM  9.7 03/10/2023   EGFR 90 03/10/2023   GFRNONAA >60 05/25/2022   Lab Results  Component Value Date   CHOL 122 03/10/2023   HDL 51 03/10/2023   LDLCALC 57 03/10/2023   TRIG 68 03/10/2023   CHOLHDL 2.4 03/10/2023   Lab Results  Component Value Date   TSH 2.270 11/17/2022   Lab Results  Component Value Date   HGBA1C 5.5 05/25/2022   Lab Results  Component Value Date   WBC 10.9 (H) 11/17/2022   HGB 14.7 11/17/2022   HCT 42.6 11/17/2022  MCV 96 11/17/2022   PLT 240 11/17/2022   Lab Results  Component Value Date   ALT 28 03/10/2023   AST 23 03/10/2023   ALKPHOS 87 03/10/2023   BILITOT 0.7 03/10/2023   No results found for: "25OHVITD2", "25OHVITD3", "VD25OH"   Patient Active Problem List   Diagnosis Date Noted   Coronary artery calcification seen on CT scan 03/10/2023   Pulmonary nodules/lesions, multiple 12/17/2021   Mixed hyperlipidemia 06/30/2021   BPH with obstruction/lower urinary tract symptoms 06/30/2021   Bilateral carotid artery disease, unspecified type (HCC)  06/30/2021   History of colonic polyps    Polyp of transverse colon    Gastroesophageal reflux disease 03/14/2017   Ulnar neuritis, left 04/20/2016   Complex regional pain syndrome type 2 of left upper extremity 02/12/2016   Rhinitis, allergic 07/29/2015   LPRD (laryngopharyngeal reflux disease) 03/31/2015   Panic disorder without agoraphobia 03/31/2015   Old cardioembolic stroke with hemiparesis of dominant side (HCC) 12/03/2014   HSV-2 infection 12/03/2014   Obstructive apnea 12/03/2014   Disorder of rotator cuff 12/03/2014    Allergies  Allergen Reactions   Hydrocodone  Nausea Only and Anxiety   Lyrica [Pregabalin] Other (See Comments)    "drunk feeling" "felt like I was going to die"    Morphine  Other (See Comments)    Not effective     Past Surgical History:  Procedure Laterality Date   COLONOSCOPY WITH PROPOFOL  N/A 06/01/2015   Procedure: COLONOSCOPY WITH PROPOFOL ;  Surgeon: Marnee Sink, MD;  Location: [facility];  Service: Endoscopy;  Laterality: N/A;  ASCENDING COLON POLYP DESCENDING COLON POLYP TRANSVERSE COLON POLYP   COLONOSCOPY WITH PROPOFOL  N/A 02/19/2021   Procedure: COLONOSCOPY WITH PROPOFOL ;  Surgeon: Marnee Sink, MD;  Location: [facility];  Service: Endoscopy;  Laterality: N/A;   EYE SURGERY     Age 53    POLYPECTOMY  02/19/2021   Procedure: POLYPECTOMY;  Surgeon: Marnee Sink, MD;  Location: [facility];  Service: Endoscopy;;   REVERSE SHOULDER ARTHROPLASTY Right 05/26/2022   Procedure: REVERSE SHOULDER ARTHROPLASTY;  Surgeon: Sammye Cristal, MD;  Location: [facility];  Service: Orthopedics;  Laterality: Right;   SALIVARY GLAND SURGERY     SHOULDER ARTHROSCOPY Left 03/2014   bicep and nerve problems still   TONSILLECTOMY     UMBILICAL HERNIA REPAIR N/A 08/25/2015   Procedure: HERNIA REPAIR UMBILICAL ADULT;  Surgeon: Alben Alma, MD;  Location: [facility];  Service: General;  Laterality: N/A;    Social History   Tobacco Use    Smoking status: Former    Current packs/day: 0.00    Average packs/day: 2.0 packs/day for 34.5 years (69.0 ttl pk-yrs)    Types: Cigarettes    Start date: 06/28/1971    Quit date: 12/25/2005    Years since quitting: 17.9   Smokeless tobacco: Former    Types: Engineer, drilling   Vaping status: Never Used  Substance Use Topics   Alcohol use: No    Alcohol/week: 0.0 standard drinks of alcohol   Drug use: Never     Medication list has been reviewed and updated.  Current Meds  Medication Sig   aspirin  EC 81 MG tablet Take 81 mg by mouth at bedtime.   atorvastatin  (LIPITOR) 10 MG tablet Take 1 tablet (10 mg total) by mouth daily.   fluconazole  (DIFLUCAN ) 100 MG tablet Take 1 tablet (100 mg total) by mouth daily for 3 days.   fluticasone (CUTIVATE) 0.05 % cream Apply 1 Application  topically daily.   ketoconazole (NIZORAL) 2 % cream Apply 1 Application topically daily.   Magnesium  300 MG CAPS Take 300 mg by mouth every other day.   Multiple Vitamins-Minerals (MULTIVITAMIN ADULT PO) Take 1 tablet by mouth daily.   omeprazole (PRILOSEC) 20 MG capsule Take 20 mg by mouth every other day.   potassium chloride  (KLOR-CON ) 20 MEQ packet Take 20 mEq by mouth every other day.   tamsulosin  (FLOMAX ) 0.4 MG CAPS capsule Take 0.4 mg by mouth at bedtime.   valACYclovir  (VALTREX ) 500 MG tablet Take 1 tablet (500 mg total) by mouth 2 (two) times daily.   zinc gluconate 50 MG tablet Take 50 mg by mouth every other day.       11/17/2023    8:30 AM 06/02/2023    9:28 AM 05/17/2023   10:51 AM 03/10/2023   10:10 AM  GAD 7 : Generalized Anxiety Score  Nervous, Anxious, on Edge 0 0 0 1  Control/stop worrying 0 0 0 1  Worry too much - different things 0 0 0 1  Trouble relaxing 0 0 0 0  Restless 0 0 0 1  Easily annoyed or irritable 0 0 0 1  Afraid - awful might happen 0 0 0 1  Total GAD 7 Score 0 0 0 6  Anxiety Difficulty Not difficult at all Not difficult at all Not difficult at all Not difficult at all        11/17/2023    8:30 AM 06/02/2023    9:28 AM 05/17/2023   10:51 AM  Depression screen PHQ 2/9  Decreased Interest  0 0  Down, Depressed, Hopeless 0 0 0  PHQ - 2 Score 0 0 0  Altered sleeping 0 0 0  Tired, decreased energy 0 0 0  Change in appetite 0 0 0  Feeling bad or failure about yourself  0 0 0  Trouble concentrating 0 0 0  Moving slowly or fidgety/restless 0 0 0  Suicidal thoughts 0 0 0  PHQ-9 Score 0 0 0  Difficult doing work/chores Not difficult at all Not difficult at all Not difficult at all    BP Readings from Last 3 Encounters:  11/17/23 116/72  11/15/23 122/74  09/05/23 135/85    Physical Exam Vitals and nursing note reviewed.  Constitutional:      Appearance: Normal appearance. He is well-developed.  HENT:     Head: Normocephalic.     Right Ear: Ear canal and external ear normal. No middle ear effusion. Tympanic membrane is retracted. Tympanic membrane is not erythematous.     Left Ear: Ear canal and external ear normal.  No middle ear effusion. Tympanic membrane is erythematous and retracted.     Nose: Nose normal.     Right Sinus: No maxillary sinus tenderness.     Left Sinus: No maxillary sinus tenderness.     Mouth/Throat:     Pharynx: Posterior oropharyngeal erythema present. No oropharyngeal exudate.  Eyes:     Conjunctiva/sclera: Conjunctivae normal.     Pupils: Pupils are equal, round, and reactive to light.  Neck:     Thyroid : No thyromegaly.     Vascular: No carotid bruit.  Cardiovascular:     Rate and Rhythm: Normal rate and regular rhythm.     Heart sounds: Normal heart sounds.  Pulmonary:     Effort: Pulmonary effort is normal.     Breath sounds: Normal breath sounds. No wheezing.  Chest:  Breasts:    Right: No  mass.     Left: No mass.  Abdominal:     General: Bowel sounds are normal.     Palpations: Abdomen is soft.     Tenderness: There is no abdominal tenderness.  Musculoskeletal:        General: Normal range of motion.      Cervical back: Normal range of motion and neck supple.     Right lower leg: No edema.     Left lower leg: No edema.  Lymphadenopathy:     Cervical: No cervical adenopathy.  Skin:    General: Skin is warm and dry.  Neurological:     Mental Status: He is alert and oriented to person, place, and time.     Motor: Weakness (mild RUE) present.     Coordination: Coordination is intact.     Gait: Gait normal.     Deep Tendon Reflexes: Reflexes are normal and symmetric.  Psychiatric:        Attention and Perception: Attention normal.        Mood and Affect: Mood normal.        Thought Content: Thought content normal.     Wt Readings from Last 3 Encounters:  11/17/23 205 lb (93 kg)  11/15/23 204 lb (92.5 kg)  09/05/23 216 lb (98 kg)    BP 116/72   Pulse 86   Ht 6' (1.829 m)   Wt 205 lb (93 kg)   SpO2 98%   BMI 27.80 kg/m   Assessment and Plan:  Problem List Items Addressed This Visit       Unprioritized   Mixed hyperlipidemia (Chronic)   Relevant Orders   Comprehensive metabolic panel with GFR   Old cardioembolic stroke with hemiparesis of dominant side (HCC)   Persistent RUE mild weakness Continues on statin and ASA      Gastroesophageal reflux disease   Reflux symptoms are controlled on omeprazole. Patient denies red flag symptoms - no melena, weight loss, dysphagia.       Relevant Orders   CBC with Differential/Platelet   BPH with obstruction/lower urinary tract symptoms   On Flomax  and followed by Urology Last PSA 6 mo ago was normal.      Bilateral carotid artery disease, unspecified type (HCC)   Relevant Orders   Lipid panel   Coronary artery calcification seen on CT scan   Other Visit Diagnoses       Annual physical exam    -  Primary   declines all immunizations up to date on screenings     Candidal balanitis       continue topical steroids and anti-fungals will treat with diflucan  x 3 days.   Relevant Medications   ketoconazole (NIZORAL) 2 %  cream   fluconazole  (DIFLUCAN ) 100 MG tablet     Viral upper respiratory tract infection       Recommend antihistamine daily with a decongestant Tylenol  for discomfort   Relevant Medications   ketoconazole (NIZORAL) 2 % cream   fluconazole  (DIFLUCAN ) 100 MG tablet       No follow-ups on file.    Sheron Dixons, MD [facility] [place]

## 2023-11-17 NOTE — Patient Instructions (Signed)
 For throat drainage/sore throat - take Claritin  or Allegra or Zyrtec   For ear pain take tylenol  and a decongestant like Coricidin HBP or Sudafed

## 2023-11-17 NOTE — Assessment & Plan Note (Signed)
 On Flomax  and followed by Urology Last PSA 6 mo ago was normal.

## 2023-11-17 NOTE — Assessment & Plan Note (Signed)
 Persistent RUE mild weakness Continues on statin and ASA

## 2023-11-17 NOTE — Assessment & Plan Note (Signed)
 Reflux symptoms are controlled on omeprazole. Patient denies red flag symptoms - no melena, weight loss, dysphagia.

## 2023-11-18 LAB — COMPREHENSIVE METABOLIC PANEL WITH GFR
ALT: 34 IU/L (ref 0–44)
AST: 26 IU/L (ref 0–40)
Albumin: 4.7 g/dL (ref 3.8–4.9)
Alkaline Phosphatase: 100 IU/L (ref 44–121)
BUN/Creatinine Ratio: 13 (ref 10–24)
BUN: 12 mg/dL (ref 8–27)
Bilirubin Total: 0.7 mg/dL (ref 0.0–1.2)
CO2: 20 mmol/L (ref 20–29)
Calcium: 9.4 mg/dL (ref 8.6–10.2)
Chloride: 104 mmol/L (ref 96–106)
Creatinine, Ser: 0.91 mg/dL (ref 0.76–1.27)
Globulin, Total: 2.2 g/dL (ref 1.5–4.5)
Glucose: 100 mg/dL — ABNORMAL HIGH (ref 70–99)
Potassium: 4.5 mmol/L (ref 3.5–5.2)
Sodium: 142 mmol/L (ref 134–144)
Total Protein: 6.9 g/dL (ref 6.0–8.5)
eGFR: 96 mL/min/{1.73_m2} (ref >59)

## 2023-11-18 LAB — CBC WITH DIFFERENTIAL/PLATELET
Basophils Absolute: 0 10*3/uL (ref 0.0–0.2)
Basos: 0 %
EOS (ABSOLUTE): 0.1 10*3/uL (ref 0.0–0.4)
Eos: 0 %
Hematocrit: 46.1 % (ref 37.5–51.0)
Hemoglobin: 15.8 g/dL (ref 13.0–17.7)
Immature Grans (Abs): 0 10*3/uL (ref 0.0–0.1)
Immature Granulocytes: 0 %
Lymphocytes Absolute: 1.5 10*3/uL (ref 0.7–3.1)
Lymphs: 11 %
MCH: 34.5 pg — ABNORMAL HIGH (ref 26.6–33.0)
MCHC: 34.3 g/dL (ref 31.5–35.7)
MCV: 101 fL — ABNORMAL HIGH (ref 79–97)
Monocytes Absolute: 1.5 10*3/uL — ABNORMAL HIGH (ref 0.1–0.9)
Monocytes: 11 %
Neutrophils Absolute: 10.9 10*3/uL — ABNORMAL HIGH (ref 1.4–7.0)
Neutrophils: 78 %
Platelets: 239 10*3/uL (ref 150–450)
RBC: 4.58 x10E6/uL (ref 4.14–5.80)
RDW: 12.9 % (ref 11.6–15.4)
WBC: 14.1 10*3/uL — ABNORMAL HIGH (ref 3.4–10.8)

## 2023-11-18 LAB — LIPID PANEL
Chol/HDL Ratio: 2.5 ratio (ref 0.0–5.0)
Cholesterol, Total: 127 mg/dL (ref 100–199)
HDL: 51 mg/dL (ref >39)
LDL Chol Calc (NIH): 64 mg/dL (ref 0–99)
Triglycerides: 56 mg/dL (ref 0–149)
VLDL Cholesterol Cal: 12 mg/dL (ref 5–40)

## 2023-11-19 ENCOUNTER — Ambulatory Visit: Payer: Self-pay | Admitting: Internal Medicine

## 2023-11-21 ENCOUNTER — Encounter: Payer: Self-pay | Admitting: Internal Medicine

## 2023-11-23 ENCOUNTER — Encounter: Payer: Self-pay | Admitting: Internal Medicine

## 2023-11-23 ENCOUNTER — Ambulatory Visit (INDEPENDENT_AMBULATORY_CARE_PROVIDER_SITE_OTHER): Admitting: Internal Medicine

## 2023-11-23 VITALS — BP 124/70 | HR 64 | Temp 97.9°F | Ht 72.0 in | Wt 205.1 lb

## 2023-11-23 DIAGNOSIS — J069 Acute upper respiratory infection, unspecified: Secondary | ICD-10-CM | POA: Diagnosis not present

## 2023-11-23 MED ORDER — AZITHROMYCIN 250 MG PO TABS: ORAL_TABLET | ORAL | 0 refills | Status: AC

## 2023-11-23 MED ORDER — PROMETHAZINE-DM 6.25-15 MG/5ML PO SYRP: 5.0000 mL | ORAL_SOLUTION | Freq: Four times a day (QID) | ORAL | 0 refills | Status: AC | PRN

## 2023-11-23 NOTE — Progress Notes (Signed)
 Date:  11/23/2023   Name:  Jeffrey Stokes   DOB:  Jul 27, 1960   MRN:  [id number]   Chief Complaint: Cough (X 5 days, not getting any better since last seen on 11/17/23), Sore Throat, and Ear Pain (Patient said it's both ears giving him pain)  URI  This is a new problem. The current episode started in the past 7 days. The problem has been gradually worsening. There has been no fever. Associated symptoms include coughing, ear pain, headaches and a sore throat. Pertinent negatives include no chest pain or wheezing. He has tried antihistamine and decongestant for the symptoms. The treatment provided mild relief.    Review of Systems  Constitutional:  Negative for chills, fatigue and fever.  HENT:  Positive for ear pain, sinus pressure and sore throat.   Respiratory:  Positive for cough and shortness of breath. Negative for wheezing.   Cardiovascular:  Negative for chest pain and palpitations.  Neurological:  Positive for headaches. Negative for dizziness.     Lab Results  Component Value Date   NA 142 11/17/2023   K 4.5 11/17/2023   CO2 20 11/17/2023   GLUCOSE 100 (H) 11/17/2023   BUN 12 11/17/2023   CREATININE 0.91 11/17/2023   CALCIUM  9.4 11/17/2023   EGFR 96 11/17/2023   GFRNONAA >60 05/25/2022   Lab Results  Component Value Date   CHOL 127 11/17/2023   HDL 51 11/17/2023   LDLCALC 64 11/17/2023   TRIG 56 11/17/2023   CHOLHDL 2.5 11/17/2023   Lab Results  Component Value Date   TSH 2.270 11/17/2022   Lab Results  Component Value Date   HGBA1C 5.5 05/25/2022   Lab Results  Component Value Date   WBC 14.1 (H) 11/17/2023   HGB 15.8 11/17/2023   HCT 46.1 11/17/2023   MCV 101 (H) 11/17/2023   PLT 239 11/17/2023   Lab Results  Component Value Date   ALT 34 11/17/2023   AST 26 11/17/2023   ALKPHOS 100 11/17/2023   BILITOT 0.7 11/17/2023   No results found for: "25OHVITD2", "25OHVITD3", "VD25OH"   Patient Active Problem List   Diagnosis Date Noted   Coronary  artery calcification seen on CT scan 03/10/2023   Pulmonary nodules/lesions, multiple 12/17/2021   Mixed hyperlipidemia 06/30/2021   BPH with obstruction/lower urinary tract symptoms 06/30/2021   Bilateral carotid artery disease, unspecified type (HCC) 06/30/2021   History of colonic polyps    Polyp of transverse colon    Gastroesophageal reflux disease 03/14/2017   Ulnar neuritis, left 04/20/2016   Complex regional pain syndrome type 2 of left upper extremity 02/12/2016   Rhinitis, allergic 07/29/2015   LPRD (laryngopharyngeal reflux disease) 03/31/2015   Panic disorder without agoraphobia 03/31/2015   Old cardioembolic stroke with hemiparesis of dominant side (HCC) 12/03/2014   HSV-2 infection 12/03/2014   Obstructive apnea 12/03/2014   Disorder of rotator cuff 12/03/2014    Allergies  Allergen Reactions   Hydrocodone  Nausea Only and Anxiety   Lyrica [Pregabalin] Other (See Comments)    "drunk feeling" "felt like I was going to die"    Morphine  Other (See Comments)    Not effective     Past Surgical History:  Procedure Laterality Date   COLONOSCOPY WITH PROPOFOL  N/A 06/01/2015   Procedure: COLONOSCOPY WITH PROPOFOL ;  Surgeon: Marnee Sink, MD;  Location: [facility];  Service: Endoscopy;  Laterality: N/A;  ASCENDING COLON POLYP DESCENDING COLON POLYP TRANSVERSE COLON POLYP   COLONOSCOPY WITH PROPOFOL  N/A  02/19/2021   Procedure: COLONOSCOPY WITH PROPOFOL ;  Surgeon: Marnee Sink, MD;  Location: [facility];  Service: Endoscopy;  Laterality: N/A;   EYE SURGERY     Age 63    POLYPECTOMY  02/19/2021   Procedure: POLYPECTOMY;  Surgeon: Marnee Sink, MD;  Location: [facility];  Service: Endoscopy;;   REVERSE SHOULDER ARTHROPLASTY Right 05/26/2022   Procedure: REVERSE SHOULDER ARTHROPLASTY;  Surgeon: Sammye Cristal, MD;  Location: [facility];  Service: Orthopedics;  Laterality: Right;   SALIVARY GLAND SURGERY     SHOULDER ARTHROSCOPY Left 03/2014   bicep and  nerve problems still   TONSILLECTOMY     UMBILICAL HERNIA REPAIR N/A 08/25/2015   Procedure: HERNIA REPAIR UMBILICAL ADULT;  Surgeon: Alben Alma, MD;  Location: [facility];  Service: General;  Laterality: N/A;    Social History   Tobacco Use   Smoking status: Former    Current packs/day: 0.00    Average packs/day: 2.0 packs/day for 34.5 years (69.0 ttl pk-yrs)    Types: Cigarettes    Start date: 06/28/1971    Quit date: 12/25/2005    Years since quitting: 17.9   Smokeless tobacco: Former    Types: Engineer, drilling   Vaping status: Never Used  Substance Use Topics   Alcohol use: No    Alcohol/week: 0.0 standard drinks of alcohol   Drug use: Never     Medication list has been reviewed and updated.  Current Meds  Medication Sig   aspirin  EC 81 MG tablet Take 81 mg by mouth at bedtime.   atorvastatin  (LIPITOR) 10 MG tablet Take 1 tablet (10 mg total) by mouth daily.   azithromycin  (ZITHROMAX  Z-PAK) 250 MG tablet UAD   fluticasone (CUTIVATE) 0.05 % cream Apply 1 Application topically daily.   ketoconazole (NIZORAL) 2 % cream Apply 1 Application topically daily.   Magnesium  300 MG CAPS Take 300 mg by mouth every other day.   Multiple Vitamins-Minerals (MULTIVITAMIN ADULT PO) Take 1 tablet by mouth daily.   omeprazole (PRILOSEC) 20 MG capsule Take 20 mg by mouth every other day.   potassium chloride  (KLOR-CON ) 20 MEQ packet Take 20 mEq by mouth every other day.   promethazine -dextromethorphan (PROMETHAZINE -DM) 6.25-15 MG/5ML syrup Take 5 mLs by mouth 4 (four) times daily as needed for up to 9 days for cough.   tamsulosin  (FLOMAX ) 0.4 MG CAPS capsule Take 0.4 mg by mouth at bedtime.   valACYclovir  (VALTREX ) 500 MG tablet Take 1 tablet (500 mg total) by mouth 2 (two) times daily.   zinc gluconate 50 MG tablet Take 50 mg by mouth every other day.       11/17/2023    8:30 AM 06/02/2023    9:28 AM 05/17/2023   10:51 AM 03/10/2023   10:10 AM  GAD 7 : Generalized Anxiety Score   Nervous, Anxious, on Edge 0 0 0 1  Control/stop worrying 0 0 0 1  Worry too much - different things 0 0 0 1  Trouble relaxing 0 0 0 0  Restless 0 0 0 1  Easily annoyed or irritable 0 0 0 1  Afraid - awful might happen 0 0 0 1  Total GAD 7 Score 0 0 0 6  Anxiety Difficulty Not difficult at all Not difficult at all Not difficult at all Not difficult at all       11/17/2023    8:30 AM 06/02/2023    9:28 AM 05/17/2023   10:51 AM  Depression screen  PHQ 2/9  Decreased Interest  0 0  Down, Depressed, Hopeless 0 0 0  PHQ - 2 Score 0 0 0  Altered sleeping 0 0 0  Tired, decreased energy 0 0 0  Change in appetite 0 0 0  Feeling bad or failure about yourself  0 0 0  Trouble concentrating 0 0 0  Moving slowly or fidgety/restless 0 0 0  Suicidal thoughts 0 0 0  PHQ-9 Score 0 0 0  Difficult doing work/chores Not difficult at all Not difficult at all Not difficult at all    BP Readings from Last 3 Encounters:  11/23/23 124/70  11/17/23 116/72  11/15/23 122/74    Physical Exam Constitutional:      Appearance: He is well-developed.  HENT:     Right Ear: Ear canal and external ear normal. No middle ear effusion. Tympanic membrane is not erythematous or retracted.     Left Ear: Ear canal and external ear normal.  No middle ear effusion. Tympanic membrane is retracted. Tympanic membrane is not erythematous.     Nose:     Right Sinus: No maxillary sinus tenderness or frontal sinus tenderness.     Left Sinus: No maxillary sinus tenderness or frontal sinus tenderness.     Mouth/Throat:     Mouth: No oral lesions.     Pharynx: Uvula midline. Posterior oropharyngeal erythema present. No oropharyngeal exudate.     Tonsils: No tonsillar exudate or tonsillar abscesses.  Eyes:     Extraocular Movements:     Right eye: Normal extraocular motion.  Cardiovascular:     Rate and Rhythm: Normal rate and regular rhythm.     Heart sounds: Normal heart sounds. No murmur heard. Pulmonary:      Effort: Pulmonary effort is normal.     Breath sounds: Normal breath sounds. No decreased breath sounds, wheezing, rhonchi or rales.  Musculoskeletal:     Cervical back: Normal range of motion.     Right lower leg: No edema.     Left lower leg: No edema.  Lymphadenopathy:     Cervical: No cervical adenopathy.  Skin:    General: Skin is warm and dry.     Capillary Refill: Capillary refill takes less than 2 seconds.  Neurological:     General: No focal deficit present.     Mental Status: He is alert and oriented to person, place, and time.  Psychiatric:        Mood and Affect: Mood normal.        Behavior: Behavior normal.     Wt Readings from Last 3 Encounters:  11/23/23 205 lb 2 oz (93 kg)  11/17/23 205 lb (93 kg)  11/15/23 204 lb (92.5 kg)    BP 124/70   Pulse 64   Temp 97.9 F (36.6 C)   Ht 6' (1.829 m)   Wt 205 lb 2 oz (93 kg)   SpO2 95%   BMI 27.82 kg/m   Assessment and Plan:  Problem List Items Addressed This Visit   None Visit Diagnoses       URI with cough and congestion    -  Primary   continue fluids, rest, Mucinex and Tylenol    Relevant Medications   azithromycin  (ZITHROMAX  Z-PAK) 250 MG tablet   promethazine -dextromethorphan (PROMETHAZINE -DM) 6.25-15 MG/5ML syrup       No follow-ups on file.    Sheron Dixons, MD [facility] [place]

## 2023-12-11 ENCOUNTER — Other Ambulatory Visit: Payer: Self-pay

## 2023-12-11 ENCOUNTER — Encounter: Payer: Self-pay | Admitting: Internal Medicine

## 2023-12-12 ENCOUNTER — Other Ambulatory Visit: Payer: Self-pay | Admitting: Internal Medicine

## 2023-12-12 DIAGNOSIS — N138 Other obstructive and reflux uropathy: Secondary | ICD-10-CM

## 2023-12-12 MED ORDER — TAMSULOSIN HCL 0.4 MG PO CAPS: 0.4000 mg | ORAL_CAPSULE | Freq: Every day | ORAL | 3 refills | Status: AC

## 2023-12-12 NOTE — Progress Notes (Unsigned)
 Date:  12/12/2023   Name:  Jeffrey Stokes   DOB:  03-21-1961   MRN:  [id number]   Chief Complaint: No chief complaint on file.  HPI  Review of Systems   Lab Results  Component Value Date   NA 142 11/17/2023   K 4.5 11/17/2023   CO2 20 11/17/2023   GLUCOSE 100 (H) 11/17/2023   BUN 12 11/17/2023   CREATININE 0.91 11/17/2023   CALCIUM  9.4 11/17/2023   EGFR 96 11/17/2023   GFRNONAA >60 05/25/2022   Lab Results  Component Value Date   CHOL 127 11/17/2023   HDL 51 11/17/2023   LDLCALC 64 11/17/2023   TRIG 56 11/17/2023   CHOLHDL 2.5 11/17/2023   Lab Results  Component Value Date   TSH 2.270 11/17/2022   Lab Results  Component Value Date   HGBA1C 5.5 05/25/2022   Lab Results  Component Value Date   WBC 14.1 (H) 11/17/2023   HGB 15.8 11/17/2023   HCT 46.1 11/17/2023   MCV 101 (H) 11/17/2023   PLT 239 11/17/2023   Lab Results  Component Value Date   ALT 34 11/17/2023   AST 26 11/17/2023   ALKPHOS 100 11/17/2023   BILITOT 0.7 11/17/2023   No results found for: Lucetta Russel, VD25OH   Patient Active Problem List   Diagnosis Date Noted   Coronary artery calcification seen on CT scan 03/10/2023   Pulmonary nodules/lesions, multiple 12/17/2021   Mixed hyperlipidemia 06/30/2021   BPH with obstruction/lower urinary tract symptoms 06/30/2021   Bilateral carotid artery disease, unspecified type (HCC) 06/30/2021   History of colonic polyps    Polyp of transverse colon    Gastroesophageal reflux disease 03/14/2017   Ulnar neuritis, left 04/20/2016   Complex regional pain syndrome type 2 of left upper extremity 02/12/2016   Rhinitis, allergic 07/29/2015   LPRD (laryngopharyngeal reflux disease) 03/31/2015   Panic disorder without agoraphobia 03/31/2015   Old cardioembolic stroke with hemiparesis of dominant side (HCC) 12/03/2014   HSV-2 infection 12/03/2014   Obstructive apnea 12/03/2014   Disorder of rotator cuff 12/03/2014    Allergies   Allergen Reactions   Hydrocodone  Nausea Only and Anxiety   Lyrica [Pregabalin] Other (See Comments)    drunk feeling felt like I was going to die    Morphine  Other (See Comments)    Not effective     Past Surgical History:  Procedure Laterality Date   COLONOSCOPY WITH PROPOFOL  N/A 06/01/2015   Procedure: COLONOSCOPY WITH PROPOFOL ;  Surgeon: Marnee Sink, MD;  Location: [facility];  Service: Endoscopy;  Laterality: N/A;  ASCENDING COLON POLYP DESCENDING COLON POLYP TRANSVERSE COLON POLYP   COLONOSCOPY WITH PROPOFOL  N/A 02/19/2021   Procedure: COLONOSCOPY WITH PROPOFOL ;  Surgeon: Marnee Sink, MD;  Location: [facility];  Service: Endoscopy;  Laterality: N/A;   EYE SURGERY     Age 63    POLYPECTOMY  02/19/2021   Procedure: POLYPECTOMY;  Surgeon: Marnee Sink, MD;  Location: [facility];  Service: Endoscopy;;   REVERSE SHOULDER ARTHROPLASTY Right 05/26/2022   Procedure: REVERSE SHOULDER ARTHROPLASTY;  Surgeon: Sammye Cristal, MD;  Location: [facility];  Service: Orthopedics;  Laterality: Right;   SALIVARY GLAND SURGERY     SHOULDER ARTHROSCOPY Left 03/2014   bicep and nerve problems still   TONSILLECTOMY     UMBILICAL HERNIA REPAIR N/A 08/25/2015   Procedure: HERNIA REPAIR UMBILICAL ADULT;  Surgeon: Alben Alma, MD;  Location: [facility];  Service: General;  Laterality:  N/A;    Social History   Tobacco Use   Smoking status: Former    Current packs/day: 0.00    Average packs/day: 2.0 packs/day for 34.5 years (69.0 ttl pk-yrs)    Types: Cigarettes    Start date: 06/28/1971    Quit date: 12/25/2005    Years since quitting: 17.9   Smokeless tobacco: Former    Types: Engineer, drilling   Vaping status: Never Used  Substance Use Topics   Alcohol use: No    Alcohol/week: 0.0 standard drinks of alcohol   Drug use: Never     Medication list has been reviewed and updated.  No outpatient medications have been marked as taking for the 12/12/23 encounter (Orders  Only) with Sheron Dixons, MD.       11/17/2023    8:30 AM 06/02/2023    9:28 AM 05/17/2023   10:51 AM 03/10/2023   10:10 AM  GAD 7 : Generalized Anxiety Score  Nervous, Anxious, on Edge 0 0 0 1  Control/stop worrying 0 0 0 1  Worry too much - different things 0 0 0 1  Trouble relaxing 0 0 0 0  Restless 0 0 0 1  Easily annoyed or irritable 0 0 0 1  Afraid - awful might happen 0 0 0 1  Total GAD 7 Score 0 0 0 6  Anxiety Difficulty Not difficult at all Not difficult at all Not difficult at all Not difficult at all       11/17/2023    8:30 AM 06/02/2023    9:28 AM 05/17/2023   10:51 AM  Depression screen PHQ 2/9  Decreased Interest  0 0  Down, Depressed, Hopeless 0 0 0  PHQ - 2 Score 0 0 0  Altered sleeping 0 0 0  Tired, decreased energy 0 0 0  Change in appetite 0 0 0  Feeling bad or failure about yourself  0 0 0  Trouble concentrating 0 0 0  Moving slowly or fidgety/restless 0 0 0  Suicidal thoughts 0 0 0  PHQ-9 Score 0 0 0  Difficult doing work/chores Not difficult at all Not difficult at all Not difficult at all    BP Readings from Last 3 Encounters:  11/23/23 124/70  11/17/23 116/72  11/15/23 122/74    Physical Exam  Wt Readings from Last 3 Encounters:  11/23/23 205 lb 2 oz (93 kg)  11/17/23 205 lb (93 kg)  11/15/23 204 lb (92.5 kg)    There were no vitals taken for this visit.  Assessment and Plan:  Problem List Items Addressed This Visit   None   No follow-ups on file.    Sheron Dixons, MD [facility] [place]

## 2024-01-26 DIAGNOSIS — F32A Depression, unspecified: Secondary | ICD-10-CM | POA: Diagnosis not present

## 2024-01-26 DIAGNOSIS — G4730 Sleep apnea, unspecified: Secondary | ICD-10-CM | POA: Diagnosis not present

## 2024-01-26 DIAGNOSIS — K219 Gastro-esophageal reflux disease without esophagitis: Secondary | ICD-10-CM | POA: Diagnosis not present

## 2024-01-26 DIAGNOSIS — F419 Anxiety disorder, unspecified: Secondary | ICD-10-CM | POA: Diagnosis not present

## 2024-01-26 DIAGNOSIS — I700 Atherosclerosis of aorta: Secondary | ICD-10-CM | POA: Diagnosis not present

## 2024-01-26 DIAGNOSIS — R739 Hyperglycemia, unspecified: Secondary | ICD-10-CM | POA: Diagnosis not present

## 2024-01-26 DIAGNOSIS — I2510 Atherosclerotic heart disease of native coronary artery without angina pectoris: Secondary | ICD-10-CM | POA: Diagnosis not present

## 2024-01-26 DIAGNOSIS — Z833 Family history of diabetes mellitus: Secondary | ICD-10-CM | POA: Diagnosis not present

## 2024-01-26 DIAGNOSIS — E785 Hyperlipidemia, unspecified: Secondary | ICD-10-CM | POA: Diagnosis not present

## 2024-01-26 DIAGNOSIS — I10 Essential (primary) hypertension: Secondary | ICD-10-CM | POA: Diagnosis not present

## 2024-01-26 DIAGNOSIS — Z7952 Long term (current) use of systemic steroids: Secondary | ICD-10-CM | POA: Diagnosis not present

## 2024-01-26 DIAGNOSIS — N400 Enlarged prostate without lower urinary tract symptoms: Secondary | ICD-10-CM | POA: Diagnosis not present

## 2024-01-26 DIAGNOSIS — G629 Polyneuropathy, unspecified: Secondary | ICD-10-CM | POA: Diagnosis not present

## 2024-02-02 DIAGNOSIS — M722 Plantar fascial fibromatosis: Secondary | ICD-10-CM | POA: Diagnosis not present

## 2024-02-02 DIAGNOSIS — M19071 Primary osteoarthritis, right ankle and foot: Secondary | ICD-10-CM | POA: Diagnosis not present

## 2024-02-02 DIAGNOSIS — M19072 Primary osteoarthritis, left ankle and foot: Secondary | ICD-10-CM | POA: Diagnosis not present

## 2024-02-08 ENCOUNTER — Other Ambulatory Visit: Payer: Self-pay | Admitting: Internal Medicine

## 2024-02-08 DIAGNOSIS — I6523 Occlusion and stenosis of bilateral carotid arteries: Secondary | ICD-10-CM

## 2024-02-12 NOTE — Telephone Encounter (Signed)
 Requested Prescriptions  Pending Prescriptions Disp Refills   atorvastatin  (LIPITOR) 10 MG tablet [Pharmacy Med Name: Atorvastatin  Calcium  10 MG Oral Tablet] 90 tablet 0    Sig: Take 1 tablet by mouth once daily     Cardiovascular:  Antilipid - Statins Failed - 02/12/2024 12:24 PM      Failed - Lipid Panel in normal range within the last 12 months    Cholesterol, Total  Date Value Ref Range Status  11/17/2023 127 100 - 199 mg/dL Final   LDL Chol Calc (NIH)  Date Value Ref Range Status  11/17/2023 64 0 - 99 mg/dL Final   HDL  Date Value Ref Range Status  11/17/2023 51 >39 mg/dL Final   Triglycerides  Date Value Ref Range Status  11/17/2023 56 0 - 149 mg/dL Final         Passed - Patient is not pregnant      Passed - Valid encounter within last 12 months    Recent Outpatient Visits           2 months ago URI with cough and congestion   [place] [facility] at [facility], Leita DEL, MD   2 months ago Annual physical exam   [facility] at [facility], Leita DEL, MD

## 2024-03-20 ENCOUNTER — Ambulatory Visit (INDEPENDENT_AMBULATORY_CARE_PROVIDER_SITE_OTHER): Payer: Self-pay | Admitting: Emergency Medicine

## 2024-03-20 VITALS — Ht 72.0 in | Wt 198.0 lb

## 2024-03-20 DIAGNOSIS — Z0000 Encounter for general adult medical examination without abnormal findings: Secondary | ICD-10-CM

## 2024-03-20 NOTE — Progress Notes (Signed)
 Subjective:   Jeffrey Stokes is a 63 y.o. who presents for a Medicare Wellness preventive visit.  As a reminder, Annual Wellness Visits don't include a physical exam, and some assessments may be limited, especially if this visit is performed virtually. We may recommend an in-person follow-up visit with your provider if needed.  Visit Complete: Virtual I connected with  Jeffrey Stokes on 03/20/24 by a video and audio enabled telemedicine application and verified that I am speaking with the correct person using two identifiers.  Patient Location: Home  Provider Location: Home Office  I discussed the limitations of evaluation and management by telemedicine. The patient expressed understanding and agreed to proceed.  Vital Signs: Because this visit was a virtual/telehealth visit, some criteria may be missing or patient reported. Any vitals not documented were not able to be obtained and vitals that have been documented are patient reported.    Persons Participating in Visit: Patient.  AWV Questionnaire: No: Patient Medicare AWV questionnaire was not completed prior to this visit.  Cardiac Risk Factors include: advanced age (>32men, >109 women);male gender;dyslipidemia;Other (see comment), Risk factor comments: OSA no cpap     Objective:    Today's Vitals   03/20/24 0800  Weight: 198 lb (89.8 kg)  Height: 6' (1.829 m)   Body mass index is 26.85 kg/m.     03/20/2024    8:16 AM 08/07/2023    7:42 AM 08/03/2023   10:53 AM 03/15/2023    8:52 AM 05/25/2022   10:01 AM 03/09/2022    8:21 AM 03/01/2021    8:59 AM  Advanced Directives  Does Patient Have a Medical Advance Directive? Yes Yes Yes No Yes No;Yes No  Type of Estate agent of [place];Living will  Living will;Healthcare Power of Attorney  Living will;Healthcare Power of Attorney Living will   Does patient want to make changes to medical advance directive? No - Patient declined        Copy of Healthcare Power of  Attorney in Chart? No - copy requested        Would patient like information on creating a medical advance directive?    No - Patient declined  No - Patient declined No - Patient declined    Current Medications (verified) Outpatient Encounter Medications as of 03/20/2024  Medication Sig   aspirin  EC 81 MG tablet Take 81 mg by mouth at bedtime.   atorvastatin  (LIPITOR) 10 MG tablet Take 1 tablet by mouth once daily   Cyanocobalamin (VITAMIN B-12 PO) Take 1 tablet by mouth daily.   fluticasone (CUTIVATE) 0.05 % cream Apply 1 Application topically daily.   ketoconazole (NIZORAL) 2 % cream Apply 1 Application topically daily.   Magnesium  300 MG CAPS Take 300 mg by mouth every other day.   meloxicam  (MOBIC ) 15 MG tablet Take 15 mg by mouth daily. (Patient taking differently: Take 15 mg by mouth daily. PRN)   Multiple Vitamins-Minerals (MULTIVITAMIN ADULT PO) Take 1 tablet by mouth daily.   omeprazole (PRILOSEC) 20 MG capsule Take 20 mg by mouth every other day. (Patient taking differently: Take 20 mg by mouth every other day. Taking daily)   potassium chloride  (KLOR-CON ) 20 MEQ packet Take 20 mEq by mouth every other day.   tamsulosin  (FLOMAX ) 0.4 MG CAPS capsule Take 1 capsule (0.4 mg total) by mouth at bedtime. (Patient taking differently: Take 0.4 mg by mouth at bedtime. PRN)   valACYclovir  (VALTREX ) 500 MG tablet Take 1 tablet (500 mg total)  by mouth 2 (two) times daily. (Patient taking differently: Take 500 mg by mouth 2 (two) times daily. PRN)   zinc gluconate 50 MG tablet Take 50 mg by mouth every other day.   No facility-administered encounter medications on file as of 03/20/2024.    Allergies (verified) Hydrocodone , Lyrica [pregabalin], and Morphine    History: Past Medical History:  Diagnosis Date   Allergy    Anxiety    Benign neoplasm of ascending colon    Benign neoplasm of descending colon    Benign neoplasm of transverse colon    Depression    Family history of adverse  reaction to anesthesia    GERD (gastroesophageal reflux disease)    History of motion sickness    laying on back   HOH (hard of hearing)    bilateral   Hx-TIA (transient ischemic attack) 2012   Non-recurrent bilateral inguinal hernia without obstruction or gangrene 07/25/2023   Sleep apnea    had one sleep study, inconclusive,needs repeat   Stroke (HCC)    3 strokes last one on 2012/ mouth tilt/ weakness right hand side   Wears dentures    full upper and lower   Past Surgical History:  Procedure Laterality Date   COLONOSCOPY WITH PROPOFOL  N/A 06/01/2015   Procedure: COLONOSCOPY WITH PROPOFOL ;  Surgeon: Rogelia Copping, MD;  Location[facility];  Service: Endoscopy;  Laterality: N/A;  ASCENDING COLON POLYP DESCENDING COLON POLYP TRANSVERSE COLON POLYP   COLONOSCOPY WITH PROPOFOL  N/A 02/19/2021   Procedure: COLONOSCOPY WITH PROPOFOL ;  Surgeon: Copping Rogelia, MD;  Location: [facility];  Service: Endoscopy;  Laterality: N/A;   EYE SURGERY     Age 67    POLYPECTOMY  02/19/2021   Procedure: POLYPECTOMY;  Surgeon: Copping Rogelia, MD;  Location: [facility];  Service: Endoscopy;;   REVERSE SHOULDER ARTHROPLASTY Right 05/26/2022   Procedure: REVERSE SHOULDER ARTHROPLASTY;  Surgeon: Dozier Soulier, MD;  Location: [facility];  Service: Orthopedics;  Laterality: Right;   SALIVARY GLAND SURGERY     SHOULDER ARTHROSCOPY Left 03/2014   bicep and nerve problems still   TONSILLECTOMY     UMBILICAL HERNIA REPAIR N/A 08/25/2015   Procedure: HERNIA REPAIR UMBILICAL ADULT;  Surgeon: Laneta JULIANNA Luna, MD;  Location: [facility];  Service: General;  Laterality: N/A;   Family History  Problem Relation Age of Onset   COPD Mother    Osteoarthritis Father    Stroke Father    Social History   Socioeconomic History   Marital status: Married    Spouse name: Erminio   Number of children: 1   Years of education: 9   Highest education level: GED or equivalent  Occupational History    Occupation: Works for [employer]    Comment: Disability   Occupation: disability  Tobacco Use   Smoking status: Former    Current packs/day: 0.00    Average packs/day: 2.0 packs/day for 34.5 years (69.0 ttl pk-yrs)    Types: Cigarettes    Start date: 06/28/1971    Quit date: 12/25/2005    Years since quitting: 18.2   Smokeless tobacco: Former    Types: Chew    Quit date: 1985  Vaping Use   Vaping status: Never Used  Substance and Sexual Activity   Alcohol use: No    Alcohol/week: 0.0 standard drinks of alcohol   Drug use: Never   Sexual activity: Yes    Birth control/protection: None  Other Topics Concern   Not on file  Social History Narrative   03/20/24 1 biological child and 3 step-children   Social Drivers of Corporate investment banker Strain: Low Risk  (03/20/2024)   Overall Financial Resource Strain (CARDIA)    Difficulty of Paying Living Expenses: Not hard at all  Food Insecurity: No Food Insecurity (03/20/2024)   Hunger Vital Sign    Worried About Running Out of Food in the Last Year: Never true    Ran Out of Food in the Last Year: Never true  Transportation Needs: No Transportation Needs (03/20/2024)   PRAPARE - Administrator, Civil Service (Medical): No    Lack of Transportation (Non-Medical): No  Physical Activity: Inactive (03/20/2024)   Exercise Vital Sign    Days of Exercise per Week: 0 days    Minutes of Exercise per Session: 0 min  Stress: No Stress Concern Present (03/20/2024)   [employer] of Occupational Health - Occupational Stress Questionnaire    Feeling of Stress: Not at all  Social Connections: Socially Integrated (03/20/2024)   Social Connection and Isolation Panel    Frequency of Communication with Friends and Family: More than three times a week    Frequency of Social Gatherings with Friends and Family: More than three times a week    Attends Religious Services: More than 4 times per year    Active Member of [employer] or  Organizations: Yes    Attends Banker Meetings: 1 to 4 times per year    Marital Status: Married    Tobacco Counseling Counseling given: Not Answered    Clinical Intake:  Pre-visit preparation completed: Yes  Pain : No/denies pain     BMI - recorded: 26.85 Nutritional Status: BMI 25 -29 Overweight Nutritional Risks: None Diabetes: No  Lab Results  Component Value Date   HGBA1C 5.5 05/25/2022     How often do you need to have someone help you when you read instructions, pamphlets, or other written materials from your doctor or pharmacy?: 3 - Sometimes (wife helps if needed) What is the last grade level you completed in school?: 9th, completed GED  Interpreter Needed?: No  Information entered by :: Vina Ned, CMA   Activities of Daily Living     03/20/2024    8:03 AM 08/03/2023   10:52 AM  In your present state of health, do you have any difficulty performing the following activities:  Hearing? 0 0  Vision? 0 0  Difficulty concentrating or making decisions? 0 0  Walking or climbing stairs? 0   Dressing or bathing? 0   Doing errands, shopping? 0   Preparing Food and eating ? N   Using the Toilet? N   In the past six months, have you accidently leaked urine? N   Do you have problems with loss of bowel control? N   Managing your Medications? N   Managing your Finances? N   Housekeeping or managing your Housekeeping? N     Patient Care Team: Justus Leita DEL, MD as PCP - General (Internal Medicine) Stacia Diannah SQUIBB, MD as PCP - Cardiology (Cardiology) Jinny Carmine, MD as Consulting Physician (Gastroenterology)  I have updated your Care Teams any recent Medical Services you may have received from other providers in the past year.     Assessment:   This is a routine wellness examination for [place].  Hearing/Vision screen Hearing Screening - Comments:: Denies hearing loss  Vision Screening - Comments:: Gets routine eye exams, [facility]   [place]  Goals Addressed               This Visit's Progress     Weight (lb) < 190 lb (86.2 kg) (pt-stated)   198 lb (89.8 kg)      Depression Screen     03/20/2024    8:13 AM 11/17/2023    8:30 AM 06/02/2023    9:28 AM 05/17/2023   10:51 AM 03/15/2023    8:50 AM 03/10/2023   10:05 AM 10/06/2022   10:17 AM  PHQ 2/9 Scores  PHQ - 2 Score 0 0 0 0 0 1 0  PHQ- 9 Score 0 0 0 0 0 3 9    Fall Risk     03/20/2024    8:16 AM 11/17/2023    8:30 AM 11/15/2023    9:16 AM 07/25/2023    9:10 AM 06/02/2023    9:28 AM  Fall Risk   Falls in the past year? 1 0 0 0 0  Number falls in past yr: 0 0 0 0 0  Injury with Fall? 0 0   0  Risk for fall due to : History of fall(s);Impaired balance/gait;Orthopedic patient No Fall Risks No Fall Risks No Fall Risks No Fall Risks  Follow up Falls evaluation completed;Education provided Falls evaluation completed Falls evaluation completed Falls evaluation completed Falls evaluation completed    MEDICARE RISK AT HOME:  Medicare Risk at Home Any stairs in or around the home?: Yes (2 steps) If so, are there any without handrails?: No Home free of loose throw rugs in walkways, pet beds, electrical cords, etc?: Yes Adequate lighting in your home to reduce risk of falls?: Yes Life alert?: No Use of a cane, walker or w/c?: No Grab bars in the bathroom?: Yes Shower chair or bench in shower?: Yes Elevated toilet seat or a handicapped toilet?: Yes  TIMED UP AND GO:  Was the test performed?  No  Cognitive Function: 6CIT completed        03/20/2024    8:18 AM 03/15/2023    8:54 AM 03/09/2022    8:23 AM 03/01/2021    9:10 AM  6CIT Screen  What Year? 0 points 0 points  0 points  What month? 0 points 0 points 0 points 0 points  What time? 0 points 0 points 0 points 0 points  Count back from 20 0 points 0 points 0 points 0 points  Months in reverse 0 points 0 points 2 points 0 points  Repeat phrase 2 points 0 points 0 points 0 points  Total Score 2  points 0 points  0 points    Immunizations Immunization History  Administered Date(s) Administered   Influenza,inj,Quad PF,6+ Mos 03/14/2017, 05/04/2018, 06/30/2021   Influenza-Unspecified 03/27/2015   Tdap 11/30/2021    Screening Tests Health Maintenance  Topic Date Due   Pneumococcal Vaccine: 50+ Years (1 of 2 - PCV) Never done   Zoster Vaccines- Shingrix (1 of 2) Never done   Influenza Vaccine  09/24/2024 (Originally 01/26/2024)   Medicare Annual Wellness (AWV)  03/20/2025   Colonoscopy  02/20/2028   DTaP/Tdap/Td (2 - Td or Tdap) 12/01/2031   Hepatitis C Screening  Addressed   HIV Screening  Addressed   Hepatitis B Vaccines 19-59 Average Risk  Aged Out   HPV VACCINES  Aged Out   Meningococcal B Vaccine  Aged Out   COVID-19 Vaccine  Discontinued    Health Maintenance Items Addressed: See Nurse Notes at the end of this note  Additional Screening:  Vision Screening: Recommended annual ophthalmology exams for early detection of glaucoma and other disorders of the eye. Is the patient up to date with their annual eye exam?  Yes  Who is the provider or what is the name of the office in which the patient attends annual eye exams? [facility] [place]  Dental Screening: Recommended annual dental exams for proper oral hygiene  Community Resource Referral / Chronic Care Management: CRR required this visit?  No   CCM required this visit?  No   Plan:    I have personally reviewed and noted the following in the patient's chart:   Medical and social history Use of alcohol, tobacco or illicit drugs  Current medications and supplements including opioid prescriptions. Patient is not currently taking opioid prescriptions. Functional ability and status Nutritional status Physical activity Advanced directives List of other physicians Hospitalizations, surgeries, and ER visits in previous 12 months Vitals Screenings to include cognitive, depression, and  falls Referrals and appointments  In addition, I have reviewed and discussed with patient certain preventive protocols, quality metrics, and best practice recommendations. A written personalized care plan for preventive services as well as general preventive health recommendations were provided to patient.   Vina Ned, CMA   03/20/2024   After Visit Summary: (MyChart) Due to this being a telephonic visit, the after visit summary with patients personalized plan was offered to patient via MyChart   Notes:  6 CIT Score - 2 Scheduled 6 mth f/u for 05/22/24 Declined flu, covid pneumonia and shingles vaccines

## 2024-03-20 NOTE — Patient Instructions (Signed)
 Jeffrey Stokes,  Thank you for taking the time for your Medicare Wellness Visit. I appreciate your continued commitment to your health goals. Please review the care plan we discussed, and feel free to reach out if I can assist you further.  Medicare recommends these wellness visits once per year to help you and your care team stay ahead of potential health issues. These visits are designed to focus on prevention, allowing your provider to concentrate on managing your acute and chronic conditions during your regular appointments.  Please note that Annual Wellness Visits do not include a physical exam. Some assessments may be limited, especially if the visit was conducted virtually. If needed, we may recommend a separate in-person follow-up with your provider.  Ongoing Care Seeing your primary care provider every 3 to 6 months helps us  monitor your health and provide consistent, personalized care.   Referrals If a referral was made during today's visit and you haven't received any updates within two weeks, please contact the referred provider directly to check on the status.  Recommended Screenings: Your next Medicare AWV will be due after 03/20/25 with your new PCP.  Health Maintenance  Topic Date Due   Pneumococcal Vaccine for age over 66 (1 of 2 - PCV) Never done   Zoster (Shingles) Vaccine (1 of 2) Never done   Flu Shot  09/24/2024*   Medicare Annual Wellness Visit  03/20/2025   Colon Cancer Screening  02/20/2028   DTaP/Tdap/Td vaccine (2 - Td or Tdap) 12/01/2031   Hepatitis C Screening  Addressed   HIV Screening  Addressed   Hepatitis B Vaccine  Aged Out   HPV Vaccine  Aged Out   Meningitis B Vaccine  Aged Out   COVID-19 Vaccine  Discontinued  *Topic was postponed. The date shown is not the original due date.       03/20/2024    8:16 AM  Advanced Directives  Does Patient Have a Medical Advance Directive? Yes  Type of Estate agent of [place];Living will  Does  patient want to make changes to medical advance directive? No - Patient declined  Copy of Healthcare Power of Attorney in Chart? No - copy requested   Advance Care Planning is important because it: Ensures you receive medical care that aligns with your values, goals, and preferences. Provides guidance to your family and loved ones, reducing the emotional burden of decision-making during critical moments.  Vision: Annual vision screenings are recommended for early detection of glaucoma, cataracts, and diabetic retinopathy. These exams can also reveal signs of chronic conditions such as diabetes and high blood pressure.  Dental: Annual dental screenings help detect early signs of oral cancer, gum disease, and other conditions linked to overall health, including heart disease and diabetes.  Please see the attached documents for additional preventive care recommendations.   Fall Prevention in the Home, Adult Falls can cause injuries and affect people of all ages. There are many simple things that you can do to make your home safe and to help prevent falls. If you need it, ask for help making these changes. What actions can I take to prevent falls? General information Use good lighting in all rooms. Make sure to: Replace any light bulbs that burn out. Turn on lights if it is dark and use night-lights. Keep items that you use often in easy-to-reach places. Lower the shelves around your home if needed. Move furniture so that there are clear paths around it. Do not keep throw rugs or  other things on the floor that can make you trip. If any of your floors are uneven, fix them. Add color or contrast paint or tape to clearly mark and help you see: Grab bars or handrails. First and last steps of staircases. Where the edge of each step is. If you use a ladder or stepladder: Make sure that it is fully opened. Do not climb a closed ladder. Make sure the sides of the ladder are locked in place. Have  someone hold the ladder while you use it. Know where your pets are as you move through your home. What can I do in the bathroom?     Keep the floor dry. Clean up any water  that is on the floor right away. Remove soap buildup in the bathtub or shower. Buildup makes bathtubs and showers slippery. Use non-skid mats or decals on the floor of the bathtub or shower. Attach bath mats securely with double-sided, non-slip rug tape. If you need to sit down while you are in the shower, use a non-slip stool. Install grab bars by the toilet and in the bathtub and shower. Do not use towel bars as grab bars. What can I do in the bedroom? Make sure that you have a light by your bed that is easy to reach. Do not use any sheets or blankets on your bed that hang to the floor. Have a firm bench or chair with side arms that you can use for support when you get dressed. What can I do in the kitchen? Clean up any spills right away. If you need to reach something above you, use a sturdy step stool that has a grab bar. Keep electrical cables out of the way. Do not use floor polish or wax that makes floors slippery. What can I do with my stairs? Do not leave anything on the stairs. Make sure that you have a light switch at the top and the bottom of the stairs. Have them installed if you do not have them. Make sure that there are handrails on both sides of the stairs. Fix handrails that are broken or loose. Make sure that handrails are as long as the staircases. Install non-slip stair treads on all stairs in your home if they do not have carpet. Avoid having throw rugs at the top or bottom of stairs, or secure the rugs with carpet tape to prevent them from moving. Choose a carpet design that does not hide the edge of steps on the stairs. Make sure that carpet is firmly attached to the stairs. Fix any carpet that is loose or worn. What can I do on the outside of my home? Use bright outdoor lighting. Repair the  edges of walkways and driveways and fix any cracks. Clear paths of anything that can make you trip, such as tools or rocks. Add color or contrast paint or tape to clearly mark and help you see high doorway thresholds. Trim any bushes or trees on the main path into your home. Check that handrails are securely fastened and in good repair. Both sides of all steps should have handrails. Install guardrails along the edges of any raised decks or porches. Have leaves, snow, and ice cleared regularly. Use sand, salt, or ice melt on walkways during winter months if you live where there is ice and snow. In the garage, clean up any spills right away, including grease or oil spills. What other actions can I take? Review your medicines with your health care provider. Some  medicines can make you confused or feel dizzy. This can increase your chance of falling. Wear closed-toe shoes that fit well and support your feet. Wear shoes that have rubber soles and low heels. Use a cane, walker, scooter, or crutches that help you move around if needed. Talk with your provider about other ways that you can decrease your risk of falls. This may include seeing a physical therapist to learn to do exercises to improve movement and strength. Where to find more information Centers for Disease Control and Prevention, STEADI: TonerPromos.no [employer] on Aging: BaseRingTones.pl National Institute on Aging: BaseRingTones.pl Contact a health care provider if: You are afraid of falling at home. You feel weak, drowsy, or dizzy at home. You fall at home. Get help right away if you: Lose consciousness or have trouble moving after a fall. Have a fall that causes a head injury. These symptoms may be an emergency. Get help right away. Call 911. Do not wait to see if the symptoms will go away. Do not drive yourself to the hospital. This information is not intended to replace advice given to you by your health care provider. Make sure you  discuss any questions you have with your health care provider. Document Revised: 02/14/2022 Document Reviewed: 02/14/2022 Elsevier Patient Education  2024 [employer].

## 2024-05-13 ENCOUNTER — Other Ambulatory Visit: Payer: Self-pay | Admitting: Internal Medicine

## 2024-05-13 DIAGNOSIS — I6523 Occlusion and stenosis of bilateral carotid arteries: Secondary | ICD-10-CM

## 2024-05-15 NOTE — Telephone Encounter (Signed)
 Requested Prescriptions  Pending Prescriptions Disp Refills   atorvastatin  (LIPITOR) 10 MG tablet [Pharmacy Med Name: Atorvastatin  Calcium  10 MG Oral Tablet] 90 tablet 0    Sig: Take 1 tablet by mouth once daily     Cardiovascular:  Antilipid - Statins Failed - 05/15/2024 12:06 PM      Failed - Lipid Panel in normal range within the last 12 months    Cholesterol, Total  Date Value Ref Range Status  11/17/2023 127 100 - 199 mg/dL Final   LDL Chol Calc (NIH)  Date Value Ref Range Status  11/17/2023 64 0 - 99 mg/dL Final   HDL  Date Value Ref Range Status  11/17/2023 51 >39 mg/dL Final   Triglycerides  Date Value Ref Range Status  11/17/2023 56 0 - 149 mg/dL Final         Passed - Patient is not pregnant      Passed - Valid encounter within last 12 months    Recent Outpatient Visits           5 months ago URI with cough and congestion   [facility], Leita DEL, MD   6 months ago Annual physical exam   [facility], Leita DEL, MD

## 2024-05-22 ENCOUNTER — Ambulatory Visit (INDEPENDENT_AMBULATORY_CARE_PROVIDER_SITE_OTHER): Admitting: Internal Medicine

## 2024-05-22 ENCOUNTER — Encounter: Payer: Self-pay | Admitting: Internal Medicine

## 2024-05-22 VITALS — BP 110/64 | HR 61 | Temp 97.8°F | Ht 72.0 in | Wt 206.0 lb

## 2024-05-22 DIAGNOSIS — I2510 Atherosclerotic heart disease of native coronary artery without angina pectoris: Secondary | ICD-10-CM | POA: Diagnosis not present

## 2024-05-22 DIAGNOSIS — I6523 Occlusion and stenosis of bilateral carotid arteries: Secondary | ICD-10-CM

## 2024-05-22 DIAGNOSIS — N401 Enlarged prostate with lower urinary tract symptoms: Secondary | ICD-10-CM | POA: Diagnosis not present

## 2024-05-22 DIAGNOSIS — E782 Mixed hyperlipidemia: Secondary | ICD-10-CM

## 2024-05-22 DIAGNOSIS — N138 Other obstructive and reflux uropathy: Secondary | ICD-10-CM

## 2024-05-22 MED ORDER — ATORVASTATIN CALCIUM 20 MG PO TABS: 20.0000 mg | ORAL_TABLET | Freq: Every day | ORAL | 0 refills | Status: DC

## 2024-05-22 NOTE — Assessment & Plan Note (Signed)
 Due for PSA Continues on Flomax  with good response

## 2024-05-22 NOTE — Patient Instructions (Signed)
 Team Member Role and Visual Merchandiser Info Address Start End Comments  Mallipeddi, Diannah SQUIBB, MD Consulting Physician (Cardiology) Phone: [phone] Fax: [phone] [address] [place] [address] 05/22/2024 - -

## 2024-05-22 NOTE — Assessment & Plan Note (Signed)
 Will double atorvastatin  to 20 mg. Recommend recheck of labs in 3-4 months

## 2024-05-22 NOTE — Progress Notes (Signed)
 Date:  05/22/2024   Name:  Jeffrey Stokes   DOB:  10/15/1960   MRN:  [id number]   Chief Complaint: Medical Management of Chronic Issues (Patient presents today for a routine follow up. He is doing well today and would like to discuss overall health and he would like a cancer screening since it runs in his family. He would like a calcium  screening as well to see if he has any blockages. )  Hyperlipidemia This is a chronic problem. Recent lipid tests were reviewed and are normal. Associated symptoms include chest pain. Pertinent negatives include no shortness of breath. Current antihyperlipidemic treatment includes statins. The current treatment provides moderate improvement of lipids.  Benign Prostatic Hypertrophy This is a chronic problem. Obstructive symptoms include a slower stream. Pertinent negatives include no genital pain, hematuria, nausea or vomiting. He is sexually active. Past treatments include tamsulosin . The treatment provided moderate relief.    Review of Systems  Constitutional:  Negative for fatigue and unexpected weight change.  HENT:  Negative for nosebleeds.   Eyes:  Negative for visual disturbance.  Respiratory:  Negative for cough, chest tightness, shortness of breath and wheezing.   Cardiovascular:  Positive for chest pain. Negative for palpitations and leg swelling.  Gastrointestinal:  Negative for abdominal pain, constipation, diarrhea, nausea and vomiting.  Genitourinary:  Negative for hematuria.  Neurological:  Negative for dizziness, weakness, light-headedness and headaches.     Lab Results  Component Value Date   NA 142 11/17/2023   K 4.5 11/17/2023   CO2 20 11/17/2023   GLUCOSE 100 (H) 11/17/2023   BUN 12 11/17/2023   CREATININE 0.91 11/17/2023   CALCIUM  9.4 11/17/2023   EGFR 96 11/17/2023   GFRNONAA >60 05/25/2022   Lab Results  Component Value Date   CHOL 127 11/17/2023   HDL 51 11/17/2023   LDLCALC 64 11/17/2023   TRIG 56 11/17/2023    CHOLHDL 2.5 11/17/2023   Lab Results  Component Value Date   TSH 2.270 11/17/2022   Lab Results  Component Value Date   HGBA1C 5.5 05/25/2022   Lab Results  Component Value Date   WBC 14.1 (H) 11/17/2023   HGB 15.8 11/17/2023   HCT 46.1 11/17/2023   MCV 101 (H) 11/17/2023   PLT 239 11/17/2023   Lab Results  Component Value Date   ALT 34 11/17/2023   AST 26 11/17/2023   ALKPHOS 100 11/17/2023   BILITOT 0.7 11/17/2023   No results found for: MARIEN BOLLS, VD25OH   Patient Active Problem List   Diagnosis Date Noted   Coronary artery calcification seen on CT scan 03/10/2023   Pulmonary nodules/lesions, multiple 12/17/2021   Mixed hyperlipidemia 06/30/2021   BPH with obstruction/lower urinary tract symptoms 06/30/2021   Bilateral carotid artery disease, unspecified type 06/30/2021   History of colonic polyps    Polyp of transverse colon    Gastroesophageal reflux disease 03/14/2017   Ulnar neuritis, left 04/20/2016   Complex regional pain syndrome type 2 of left upper extremity 02/12/2016   Rhinitis, allergic 07/29/2015   LPRD (laryngopharyngeal reflux disease) 03/31/2015   Panic disorder without agoraphobia 03/31/2015   Old cardioembolic stroke with hemiparesis of dominant side (HCC) 12/03/2014   HSV-2 infection 12/03/2014   Obstructive apnea 12/03/2014   Disorder of rotator cuff 12/03/2014    Allergies  Allergen Reactions   Hydrocodone  Nausea Only and Anxiety   Lyrica [Pregabalin] Other (See Comments)    drunk feeling felt like I was going to  die    Morphine  Other (See Comments)    Not effective     Past Surgical History:  Procedure Laterality Date   COLONOSCOPY WITH PROPOFOL  N/A 06/01/2015   Procedure: COLONOSCOPY WITH PROPOFOL ;  Surgeon: Rogelia Copping, MD;  Location: [facility];  Service: Endoscopy;  Laterality: N/A;  ASCENDING COLON POLYP DESCENDING COLON POLYP TRANSVERSE COLON POLYP   COLONOSCOPY WITH PROPOFOL  N/A 02/19/2021    Procedure: COLONOSCOPY WITH PROPOFOL ;  Surgeon: Copping Rogelia, MD;  Location: [facility];  Service: Endoscopy;  Laterality: N/A;   EYE SURGERY     Age 63    POLYPECTOMY  02/19/2021   Procedure: POLYPECTOMY;  Surgeon: Copping Rogelia, MD;  Location: [facility];  Service: Endoscopy;;   REVERSE SHOULDER ARTHROPLASTY Right 05/26/2022   Procedure: REVERSE SHOULDER ARTHROPLASTY;  Surgeon: Dozier Soulier, MD;  Location: [facility];  Service: Orthopedics;  Laterality: Right;   SALIVARY GLAND SURGERY     SHOULDER ARTHROSCOPY Left 03/2014   bicep and nerve problems still   TONSILLECTOMY     UMBILICAL HERNIA REPAIR N/A 08/25/2015   Procedure: HERNIA REPAIR UMBILICAL ADULT;  Surgeon: Laneta JULIANNA Luna, MD;  Location: [facility];  Service: General;  Laterality: N/A;    Social History   Tobacco Use   Smoking status: Former    Current packs/day: 0.00    Average packs/day: 2.0 packs/day for 34.5 years (69.0 ttl pk-yrs)    Types: Cigarettes    Start date: 06/28/1971    Quit date: 12/25/2005    Years since quitting: 18.4   Smokeless tobacco: Former    Types: Chew    Quit date: 1985  Vaping Use   Vaping status: Never Used  Substance Use Topics   Alcohol use: No    Alcohol/week: 0.0 standard drinks of alcohol   Drug use: Never     Medication list has been reviewed and updated.  Current Meds  Medication Sig   aspirin  EC 81 MG tablet Take 81 mg by mouth at bedtime.   Cyanocobalamin (VITAMIN B-12 PO) Take 1 tablet by mouth daily.   fluticasone (CUTIVATE) 0.05 % cream Apply 1 Application topically daily.   ketoconazole (NIZORAL) 2 % cream Apply 1 Application topically daily.   Magnesium  300 MG CAPS Take 300 mg by mouth every other day.   meloxicam  (MOBIC ) 15 MG tablet Take 15 mg by mouth daily. (Patient taking differently: Take 15 mg by mouth daily. PRN)   Multiple Vitamins-Minerals (MULTIVITAMIN ADULT PO) Take 1 tablet by mouth daily.   omeprazole (PRILOSEC) 20 MG capsule Take 20 mg by mouth  every other day. (Patient taking differently: Take 20 mg by mouth every other day. Taking daily)   potassium chloride  (KLOR-CON ) 20 MEQ packet Take 20 mEq by mouth every other day.   tamsulosin  (FLOMAX ) 0.4 MG CAPS capsule Take 1 capsule (0.4 mg total) by mouth at bedtime. (Patient taking differently: Take 0.4 mg by mouth at bedtime. PRN)   valACYclovir  (VALTREX ) 500 MG tablet Take 1 tablet (500 mg total) by mouth 2 (two) times daily. (Patient taking differently: Take 500 mg by mouth 2 (two) times daily. PRN)   zinc gluconate 50 MG tablet Take 50 mg by mouth every other day.   [DISCONTINUED] atorvastatin  (LIPITOR) 10 MG tablet Take 1 tablet by mouth once daily       11/17/2023    8:30 AM 06/02/2023    9:28 AM 05/17/2023   10:51 AM 03/10/2023   10:10 AM  GAD 7 : Generalized  Anxiety Score  Nervous, Anxious, on Edge 0 0 0 1  Control/stop worrying 0 0 0 1  Worry too much - different things 0 0 0 1  Trouble relaxing 0 0 0 0  Restless 0 0 0 1  Easily annoyed or irritable 0 0 0 1  Afraid - awful might happen 0 0 0 1  Total GAD 7 Score 0 0 0 6  Anxiety Difficulty Not difficult at all Not difficult at all Not difficult at all Not difficult at all       05/22/2024   10:34 AM 03/20/2024    8:13 AM 11/17/2023    8:30 AM  Depression screen PHQ 2/9  Decreased Interest 0 0   Down, Depressed, Hopeless 0 0 0  PHQ - 2 Score 0 0 0  Altered sleeping  0 0  Tired, decreased energy  0 0  Change in appetite  0 0  Feeling bad or failure about yourself   0 0  Trouble concentrating  0 0  Moving slowly or fidgety/restless  0 0  Suicidal thoughts  0 0  PHQ-9 Score  0  0   Difficult doing work/chores  Not difficult at all Not difficult at all     Data saved with a previous flowsheet row definition    BP Readings from Last 3 Encounters:  05/22/24 110/64  11/23/23 124/70  11/17/23 116/72    Physical Exam Vitals and nursing note reviewed.  Constitutional:      General: He is not in acute  distress.    Appearance: Normal appearance. He is well-developed.  HENT:     Head: Normocephalic and atraumatic.  Cardiovascular:     Rate and Rhythm: Normal rate and regular rhythm.  Pulmonary:     Effort: Pulmonary effort is normal. No respiratory distress.     Breath sounds: No wheezing or rhonchi.  Chest:     Chest wall: No tenderness.  Musculoskeletal:     Cervical back: Normal range of motion.  Lymphadenopathy:     Cervical: No cervical adenopathy.  Skin:    General: Skin is warm and dry.     Findings: No rash.  Neurological:     General: No focal deficit present.     Mental Status: He is alert and oriented to person, place, and time.  Psychiatric:        Mood and Affect: Mood normal.        Behavior: Behavior normal.     Wt Readings from Last 3 Encounters:  05/22/24 206 lb (93.4 kg)  03/20/24 198 lb (89.8 kg)  11/23/23 205 lb 2 oz (93 kg)    BP 110/64   Pulse 61   Temp 97.8 F (36.6 C) (Oral)   Ht 6' (1.829 m)   Wt 206 lb (93.4 kg)   SpO2 93%   BMI 27.94 kg/m   Assessment and Plan:  Problem List Items Addressed This Visit       Unprioritized   Mixed hyperlipidemia (Chronic)   Will double atorvastatin  to 20 mg. Recommend recheck of labs in 3-4 months      Relevant Medications   atorvastatin  (LIPITOR) 20 MG tablet   BPH with obstruction/lower urinary tract symptoms - Primary (Chronic)   Due for PSA Continues on Flomax  with good response      Relevant Orders   PSA   Coronary artery calcification seen on CT scan (Chronic)   Having some intermittent atypical chest discomfort - not related to exertion  He is concerning about CAD - has CTA 2 years ago showing moderate 3 vessel disease but cardiology did not recommend stress testing Will increase atorvastatin  to 20 mg and refer back to Cardiology.      Relevant Medications   atorvastatin  (LIPITOR) 20 MG tablet   Other Visit Diagnoses       Mild atherosclerosis of carotid artery, bilateral   (Chronic)      Relevant Medications   atorvastatin  (LIPITOR) 20 MG tablet      He is not sure he will follow up here for PCP - may look closer to home in [place]. No follow-ups on file.    Leita HILARIO Adie, MD [facility] [place]

## 2024-05-22 NOTE — Assessment & Plan Note (Signed)
 Having some intermittent atypical chest discomfort - not related to exertion He is concerning about CAD - has CTA 2 years ago showing moderate 3 vessel disease but cardiology did not recommend stress testing Will increase atorvastatin  to 20 mg and refer back to Cardiology.

## 2024-05-23 LAB — PSA: Prostate Specific Ag, Serum: 0.5 ng/mL (ref 0.0–4.0)

## 2024-05-25 ENCOUNTER — Ambulatory Visit: Payer: Self-pay | Admitting: Internal Medicine

## 2024-06-14 ENCOUNTER — Telehealth: Payer: Self-pay | Admitting: Internal Medicine

## 2024-06-14 NOTE — Telephone Encounter (Signed)
 Spoke to pt who stated that he has had cp on/off for the last 3-4 weeks. Pt stated on a scale of 1-10, he rates cp as a 5. Pt stated that the only thing that helps is when he goes to sleep as he does not have to think about pain. Pt stated that he gets SOB with activity but no other symptoms present. Pt also admitted that he get pain in his upper arm when he starts having his CP episodes. Pt has a hx of 3 vessel coronary calcifications for which he takes aspirin  and atorvastatin . Pt stated pcp increased his atorvastatin  to 20 mg daily.   Will route to provider- advised pt of ER precautions for new/worsening symptoms.  Pt voiced understanding.

## 2024-06-14 NOTE — Telephone Encounter (Signed)
" ° °  Pt c/o of Chest Pain: STAT if active CP, including tightness, pressure, jaw pain, radiating pain to shoulder/upper arm/back, CP unrelieved by Nitro. Symptoms reported of SOB, nausea, vomiting, sweating.  1. Are you having CP right now? no    2. Are you experiencing any other symptoms (ex. SOB, nausea, vomiting, sweating)? no   3. Is your CP continuous or coming and going? Comes and goes   4. Have you taken Nitroglycerin? no   5. How long have you been experiencing CP? 3 weeks    6. If NO CP at time of call then end call with telling Pt to call back or call 911 if Chest pain returns prior to return call from triage team.  "

## 2024-06-18 MED ORDER — NITROGLYCERIN 0.4 MG SL SUBL: 0.4000 mg | SUBLINGUAL_TABLET | SUBLINGUAL | 3 refills | Status: AC | PRN

## 2024-06-18 MED ORDER — METOPROLOL TARTRATE 25 MG PO TABS: 25.0000 mg | ORAL_TABLET | Freq: Two times a day (BID) | ORAL | 3 refills | Status: AC

## 2024-06-18 NOTE — Telephone Encounter (Signed)
 I spoke with wife and she reports Jeffrey Stokes is currently sleeping. She reports he has had vomiting all night and she believes she will not be able to make him go the ED for evaluation. She says he does not have fever or abdominal pain. We discussed taking NTG and lopressor  25 mg bid and I have e-scribed this to [facility].  I encouraged her to call 911 if symptoms worsen or if he has taken 3 NTG without relief of oain and she agrees to do so.

## 2024-06-25 ENCOUNTER — Encounter: Payer: Self-pay | Admitting: Physician Assistant

## 2024-06-25 ENCOUNTER — Ambulatory Visit: Attending: Physician Assistant | Admitting: Physician Assistant

## 2024-06-25 VITALS — BP 118/70 | HR 62 | Ht 72.0 in | Wt 202.0 lb

## 2024-06-25 DIAGNOSIS — Z87891 Personal history of nicotine dependence: Secondary | ICD-10-CM

## 2024-06-25 DIAGNOSIS — E785 Hyperlipidemia, unspecified: Secondary | ICD-10-CM | POA: Diagnosis not present

## 2024-06-25 DIAGNOSIS — R079 Chest pain, unspecified: Secondary | ICD-10-CM | POA: Diagnosis not present

## 2024-06-25 DIAGNOSIS — R0602 Shortness of breath: Secondary | ICD-10-CM

## 2024-06-25 DIAGNOSIS — I2510 Atherosclerotic heart disease of native coronary artery without angina pectoris: Secondary | ICD-10-CM

## 2024-06-25 MED ORDER — METOPROLOL TARTRATE 50 MG PO TABS: ORAL_TABLET | ORAL | 0 refills | Status: AC

## 2024-06-25 NOTE — Patient Instructions (Addendum)
 Medication Instructions:   Your physician recommends that you continue on your current medications as directed. Please refer to the Current Medication list given to you today.  Take Lopressor  50 mg (1 tablet) 2 hours before CT- sent prescription to your pharmacy   Labwork: BMET within 1 week before cardiac CT  Testing/Procedures: Your physician has requested that you have an echocardiogram. Echocardiography is a painless test that uses sound waves to create images of your heart. It provides your doctor with information about the size and shape of your heart and how well your hearts chambers and valves are working. This procedure takes approximately one hour. There are no restrictions for this procedure. Please do NOT wear cologne, perfume, aftershave, or lotions (deodorant is allowed). Please arrive 15 minutes prior to your appointment time.  Please note: We ask at that you not bring children with you during ultrasound (echo/ vascular) testing. Due to room size and safety concerns, children are not allowed in the ultrasound rooms during exams. Our front office staff cannot provide observation of children in our lobby area while testing is being conducted. An adult accompanying a patient to their appointment will only be allowed in the ultrasound room at the discretion of the ultrasound technician under special circumstances. We apologize for any inconvenience.      Follow-Up: 3 months S.Dunlap,PA-C  Any Other Special Instructions Will Be Listed Below (If Applicable).  If you need a refill on your cardiac medications before your next appointment, please call your pharmacy.

## 2024-06-25 NOTE — Progress Notes (Signed)
 " Cardiology Office Note:  .   Date:  06/25/2024  ID:  Jeffrey Stokes, DOB 12-05-1960, MRN [id number] PCP: Justus Leita DEL, MD  [facility] Providers Cardiologist:  Diannah SHAUNNA Maywood, MD {  History of Present Illness: .   Jeffrey Stokes is a 63 y.o. male  with PMHx of coronary calcification on CT, CVA x3 (most recent in 2012) who reports to [facility] office for evaluation of moderate coronary calcifications.   Pertinent cardiac medical history:  Echo 2015: EF 55-60%, mild LVH, mild PV regurgitation  Carotid US  2018: Minor carotid atherosclerosis. No hemodynamically significant ICA stenosis. Degree of narrowing less than 50% bilaterally. CT angio chest/abdomen/pelvis 2023: moderate coronary artery calcifications   Last seen in heartcare 05/22/2023 by Dr. Mallipeddi to reestablish as new patient for evaluation of coronary calcifications as above. Doing well from cardiology standpoint without any complaints.  Continued on ASA 81 mg daily, Lipitor 10 mg daily.  Today, reports intermittent chest pain described as sharp pressure, 4/10, on the left side of chest lasting 2 to 5 minutes occurring 1-2 times per week associated with SOB. No SOB outside of chest pain episodes. Chest pains can occur at any time but most commonly associated with stress. Denies any chest pain with exertion.  Patient is concerned for heart attack considering that maternal uncle recently got heart stents.  Patient has a history of neuropathy in his arms and denies any worsening numbness/paresthesias in arms. Denies palpitations, syncope, presyncope, dizziness, orthopnea, PND, swelling or significant weight changes, acute bleeding, or claudication. Reports compliance with medications. He has not needed to use NTG.  Lipitor increased to 20 mg by PCP approximately 3 weeks ago.  He is able to do yard work, walking grocery store and steps without any concerns.  He stopped smoking tobacco in 2003 after 40 years. Denies  alcohol/drug use   ROS: 10 point review of system has been reviewed and considered negative except ones been listed in the HPI.   Studies Reviewed: SABRA   EKG Interpretation Date/Time:  Tuesday June 25 2024 14:40:12 EST Ventricular Rate:  62 PR Interval:  148 QRS Duration:  96 QT Interval:  406 QTC Calculation: 412 R Axis:   54  Text Interpretation: Normal sinus rhythm Normal ECG When compared with ECG of 22-May-2023 13:39, No significant change was found Confirmed by Sheron Hallmark ([id number]) on 06/25/2024 2:44:50 PM  CV Studies: Cardiac studies reviewed are outlined and summarized above. Otherwise please see EMR for full report.   Physical Exam:   VS:  BP 118/70 (BP Location: Right Arm, Cuff Size: Large)   Pulse 62   Ht 6' (1.829 m)   Wt 202 lb (91.6 kg)   SpO2 95%   BMI 27.40 kg/m    Wt Readings from Last 3 Encounters:  06/25/24 202 lb (91.6 kg)  05/22/24 206 lb (93.4 kg)  03/20/24 198 lb (89.8 kg)    GEN: Well nourished, well developed in no acute distress while sitting in chair.  NECK: No JVD; No carotid bruits CARDIAC: RRR, no murmurs, rubs, gallops RESPIRATORY:  Clear to auscultation without rales, wheezing or rhonchi  ABDOMEN: Soft, non-tender, non-distended EXTREMITIES:  No edema; No deformity   ASSESSMENT AND PLAN: .   Chest pain of uncertain etiology SOB Reports atypical chest pain described as sharp pressure lasting 2 to 3 minutes occurring 1-2 times per week associated with SOB.  Most commonly associated with stress.  Not associated with exertion.  Has not required NTG.  Less concern for ACS, however due to previous coronary artery calcification will order further ischemic evaluation. Order CCTA and BMP w/in 30 days.  Order ECHO.  Patient will consider trying Imdur if indicated based on CCTA results.     Latest Ref Rng & Units 11/17/2023    9:06 AM 03/10/2023   10:52 AM 11/17/2022   10:23 AM  BMP  Glucose 70 - 99 mg/dL 899  99  99   BUN 8 - 27 mg/dL 12   13  11    Creatinine 0.76 - 1.27 mg/dL 9.08  9.02  8.99   BUN/Creat Ratio 10 - 24 13  13  11    Sodium 134 - 144 mmol/L 142  141  143   Potassium 3.5 - 5.2 mmol/L 4.5  4.5  4.1   Chloride 96 - 106 mmol/L 104  106  107   CO2 20 - 29 mmol/L 20  22  19    Calcium  8.6 - 10.2 mg/dL 9.4  9.7  9.3      Coronary artery calcification Continue ASA 81 mg daily   Hyperlipidemia LDL goal <55 LDL above goal as below.  Per patient, PCP increased Lipitor to 20 mg approximately 3 weeks ago. Continue Lipitor 20 mg daily.  Will plan to order follow-up FLP after CCTA and/or echo results. Lipid Panel     Component Value Date/Time   CHOL 127 11/17/2023 0906   TRIG 56 11/17/2023 0906   HDL 51 11/17/2023 0906   CHOLHDL 2.5 11/17/2023 0906   LDLCALC 64 11/17/2023 0906   LABVLDL 12 11/17/2023 0906   Dispo: Follow up in 3 months with Scottie, PA-C  Signed, Jeffrey CINDERELLA Kapur, PA-C  "

## 2024-07-04 ENCOUNTER — Other Ambulatory Visit (HOSPITAL_COMMUNITY)
Admission: RE | Admit: 2024-07-04 | Discharge: 2024-07-04 | Disposition: A | Source: Ambulatory Visit | Attending: Physician Assistant | Admitting: Physician Assistant

## 2024-07-04 ENCOUNTER — Ambulatory Visit: Payer: Self-pay | Admitting: Physician Assistant

## 2024-07-04 DIAGNOSIS — R079 Chest pain, unspecified: Secondary | ICD-10-CM | POA: Insufficient documentation

## 2024-07-04 DIAGNOSIS — Z79899 Other long term (current) drug therapy: Secondary | ICD-10-CM

## 2024-07-04 LAB — BASIC METABOLIC PANEL WITH GFR
Anion gap: 15 (ref 5–15)
BUN: 14 mg/dL (ref 8–23)
CO2: 21 mmol/L — ABNORMAL LOW (ref 22–32)
Calcium: 9 mg/dL (ref 8.9–10.3)
Chloride: 104 mmol/L (ref 98–111)
Creatinine, Ser: 0.95 mg/dL (ref 0.61–1.24)
GFR, Estimated: >60 mL/min
Glucose, Bld: 106 mg/dL — ABNORMAL HIGH (ref 70–99)
Potassium: 3.4 mmol/L — ABNORMAL LOW (ref 3.5–5.1)
Sodium: 139 mmol/L (ref 135–145)

## 2024-07-05 NOTE — Telephone Encounter (Signed)
-----   Message from Lorette CINDERELLA Kapur sent at 07/04/2024 11:25 AM EST ----- Attempted to call but no answer and left voicemail, please follow-up: BMP labs are overall normal except potassium is mildly low at 3.4.  Per chart review, patient takes Klor-Con  20 mEq packet every  other day.  Verify if patient is taking potassium supplement?  If patient is taking supplement as prescribed then would recommend increasing to 20 mEq packet daily or if patient is willing to switch  to p.o. potassium supplement.  If patient prefers p.o. supplement then would recommend p.o. Kcl 20 mEq daily. Patient is scheduled for CTA on 12/13. Recommend order repeat BMP  and Mg on 12/12.

## 2024-07-08 ENCOUNTER — Ambulatory Visit: Payer: Self-pay | Admitting: Physician Assistant

## 2024-07-08 ENCOUNTER — Telehealth (HOSPITAL_COMMUNITY): Payer: Self-pay | Admitting: *Deleted

## 2024-07-08 ENCOUNTER — Other Ambulatory Visit (HOSPITAL_COMMUNITY)
Admission: RE | Admit: 2024-07-08 | Discharge: 2024-07-08 | Disposition: A | Discharge status: Home / Self Care | Source: Ambulatory Visit | Attending: Physician Assistant | Admitting: Physician Assistant

## 2024-07-08 DIAGNOSIS — Z79899 Other long term (current) drug therapy: Secondary | ICD-10-CM | POA: Diagnosis present

## 2024-07-08 LAB — MAGNESIUM: Magnesium: 2.3 mg/dL (ref 1.7–2.4)

## 2024-07-08 LAB — BASIC METABOLIC PANEL WITH GFR
Anion gap: 13 (ref 5–15)
BUN: 9 mg/dL (ref 8–23)
CO2: 23 mmol/L (ref 22–32)
Calcium: 9.4 mg/dL (ref 8.9–10.3)
Chloride: 105 mmol/L (ref 98–111)
Creatinine, Ser: 0.97 mg/dL (ref 0.61–1.24)
GFR, Estimated: >60 mL/min
Glucose, Bld: 99 mg/dL (ref 70–99)
Potassium: 4.3 mmol/L (ref 3.5–5.1)
Sodium: 141 mmol/L (ref 135–145)

## 2024-07-08 NOTE — Telephone Encounter (Signed)
 Reaching out to patient to offer assistance regarding upcoming cardiac imaging study; pt verbalizes understanding of appt date/time, parking situation and where to check in, pre-test NPO status and medications ordered, and verified current allergies; name and call back number provided for further questions should they arise Sid Seats RN Navigator Cardiac Imaging [facility] [phone] office [phone] cell

## 2024-07-09 ENCOUNTER — Ambulatory Visit (HOSPITAL_COMMUNITY)
Admission: RE | Admit: 2024-07-09 | Discharge: 2024-07-09 | Disposition: A | Discharge status: Home / Self Care | Source: Ambulatory Visit | Attending: Physician Assistant | Admitting: Physician Assistant

## 2024-07-09 ENCOUNTER — Ambulatory Visit: Payer: Self-pay | Admitting: Physician Assistant

## 2024-07-09 ENCOUNTER — Other Ambulatory Visit: Payer: Self-pay | Admitting: Cardiovascular Disease

## 2024-07-09 ENCOUNTER — Ambulatory Visit (HOSPITAL_COMMUNITY)
Admission: RE | Admit: 2024-07-09 | Discharge: 2024-07-09 | Disposition: A | Source: Ambulatory Visit | Attending: Cardiovascular Disease

## 2024-07-09 DIAGNOSIS — R931 Abnormal findings on diagnostic imaging of heart and coronary circulation: Secondary | ICD-10-CM | POA: Insufficient documentation

## 2024-07-09 DIAGNOSIS — E785 Hyperlipidemia, unspecified: Secondary | ICD-10-CM | POA: Insufficient documentation

## 2024-07-09 DIAGNOSIS — R0602 Shortness of breath: Secondary | ICD-10-CM | POA: Diagnosis present

## 2024-07-09 DIAGNOSIS — R079 Chest pain, unspecified: Secondary | ICD-10-CM | POA: Diagnosis present

## 2024-07-09 DIAGNOSIS — I2510 Atherosclerotic heart disease of native coronary artery without angina pectoris: Secondary | ICD-10-CM | POA: Diagnosis not present

## 2024-07-09 DIAGNOSIS — Z87891 Personal history of nicotine dependence: Secondary | ICD-10-CM | POA: Insufficient documentation

## 2024-07-09 DIAGNOSIS — Z79899 Other long term (current) drug therapy: Secondary | ICD-10-CM

## 2024-07-09 MED ORDER — ATORVASTATIN CALCIUM 40 MG PO TABS: 40.0000 mg | ORAL_TABLET | Freq: Every day | ORAL | 3 refills | Status: AC

## 2024-07-09 MED ORDER — NITROGLYCERIN 0.4 MG SL SUBL
0.8000 mg | SUBLINGUAL_TABLET | Freq: Once | SUBLINGUAL | Status: AC
  Administered 2024-07-09: 0.8 mg via SUBLINGUAL

## 2024-07-09 MED ORDER — ISOSORBIDE MONONITRATE ER 30 MG PO TB24: ORAL_TABLET | ORAL | 3 refills | Status: AC

## 2024-07-09 MED ORDER — IOHEXOL 350 MG/ML SOLN
100.0000 mL | Freq: Once | INTRAVENOUS | Status: AC | PRN
  Administered 2024-07-09: 100 mL via INTRAVENOUS

## 2024-07-09 NOTE — Progress Notes (Signed)
 CT FFR ordered.  Signed, Darryle DASEN. Barbaraann, MD, [facility]  [facility]  [address] [place], [place] [address] [phone]  1:36 PM

## 2024-07-09 NOTE — Telephone Encounter (Signed)
-----   Message from Lorette CINDERELLA Kapur sent at 07/09/2024  4:42 PM EST ----- Called and discussed results with patient. Noted significantly elevated calcium  score with only moderate disease in heart blood vessels with borderline significance is smaller branch. Increase  Lipitor to 40 mg for high intensity statin. Order FLP and lipoprotein a in 6 to 8 weeks. Order Imdur  30 mg daily (take 1/2 tablet for 5 days then increase to full tablet if no dizziness or  headaches). Discussed to contact office if symptoms worsening and discussed ER precautions. If no improvement with medical management then will consider cath per Dr. Barbaraann.

## 2024-07-10 ENCOUNTER — Ambulatory Visit: Payer: Self-pay | Admitting: Physician Assistant

## 2024-07-26 ENCOUNTER — Ambulatory Visit (HOSPITAL_COMMUNITY)
Admission: RE | Admit: 2024-07-26 | Discharge: 2024-07-26 | Disposition: A | Source: Ambulatory Visit | Attending: Physician Assistant

## 2024-07-26 ENCOUNTER — Ambulatory Visit: Admitting: Internal Medicine

## 2024-07-26 DIAGNOSIS — R0602 Shortness of breath: Secondary | ICD-10-CM | POA: Diagnosis present

## 2024-07-26 DIAGNOSIS — R079 Chest pain, unspecified: Secondary | ICD-10-CM | POA: Diagnosis present

## 2024-07-26 LAB — ECHOCARDIOGRAM COMPLETE
AR max vel: 2.53 cm2
AV Area VTI: 2.56 cm2
AV Area mean vel: 2.58 cm2
AV Mean grad: 4.3 mmHg
AV Peak grad: 8.4 mmHg
Ao pk vel: 1.45 m/s
Area-P 1/2: 3.6 cm2
Calc EF: 59 %
S' Lateral: 2.8 cm
Single Plane A2C EF: 56.4 %
Single Plane A4C EF: 61 %

## 2024-07-26 NOTE — Progress Notes (Signed)
*  PRELIMINARY RESULTS* Echocardiogram 2D Echocardiogram has been performed.  Jeffrey Stokes 07/26/2024, 12:38 PM

## 2024-09-27 ENCOUNTER — Ambulatory Visit: Admitting: Internal Medicine
